# Patient Record
Sex: Male | Born: 1950
Health system: Southern US, Community
[De-identification: ages and names within clinical notes are randomized; demographics above are authoritative.]

## PROBLEM LIST (undated history)

## (undated) DIAGNOSIS — E785 Hyperlipidemia, unspecified: Secondary | ICD-10-CM

## (undated) DIAGNOSIS — M1990 Unspecified osteoarthritis, unspecified site: Secondary | ICD-10-CM

## (undated) DIAGNOSIS — F419 Anxiety disorder, unspecified: Secondary | ICD-10-CM

## (undated) DIAGNOSIS — F32A Depression, unspecified: Secondary | ICD-10-CM

## (undated) DIAGNOSIS — G709 Myoneural disorder, unspecified: Secondary | ICD-10-CM

## (undated) DIAGNOSIS — M109 Gout, unspecified: Secondary | ICD-10-CM

## (undated) DIAGNOSIS — I10 Essential (primary) hypertension: Secondary | ICD-10-CM

## (undated) HISTORY — DX: Depression, unspecified: F32.A

## (undated) HISTORY — DX: Hyperlipidemia, unspecified: E78.5

## (undated) HISTORY — DX: Essential (primary) hypertension: I10

## (undated) HISTORY — DX: Unspecified osteoarthritis, unspecified site: M19.90

## (undated) HISTORY — DX: Anxiety disorder, unspecified: F41.9

## (undated) HISTORY — DX: Gout, unspecified: M10.9

## (undated) HISTORY — DX: Myoneural disorder, unspecified: G70.9

---

## 2002-10-19 HISTORY — PX: CERVICAL DISCECTOMY: SHX98

## 2005-09-22 ENCOUNTER — Emergency Department (HOSPITAL_COMMUNITY): Admission: EM | Admit: 2005-09-22 | Discharge: 2005-09-22 | Payer: Self-pay | Admitting: Family Medicine

## 2008-05-17 ENCOUNTER — Ambulatory Visit: Payer: Self-pay | Admitting: *Deleted

## 2008-05-17 ENCOUNTER — Ambulatory Visit: Payer: Self-pay | Admitting: Internal Medicine

## 2008-05-17 LAB — CONVERTED CEMR LAB
ALT: 22 units/L (ref 0–53)
Albumin: 4.4 g/dL (ref 3.5–5.2)
CO2: 21 meq/L (ref 19–32)
Calcium: 9.1 mg/dL (ref 8.4–10.5)
Chloride: 106 meq/L (ref 96–112)
Cholesterol: 231 mg/dL — ABNORMAL HIGH (ref 0–200)
Glucose, Bld: 101 mg/dL — ABNORMAL HIGH (ref 70–99)
PSA: 0.34 ng/mL (ref 0.10–4.00)
Sodium: 139 meq/L (ref 135–145)
Total Bilirubin: 0.7 mg/dL (ref 0.3–1.2)
Total Protein: 7.3 g/dL (ref 6.0–8.3)
Triglycerides: 181 mg/dL — ABNORMAL HIGH (ref <150)
VLDL: 36 mg/dL (ref 0–40)

## 2008-05-31 ENCOUNTER — Ambulatory Visit: Payer: Self-pay | Admitting: Internal Medicine

## 2008-06-22 ENCOUNTER — Ambulatory Visit: Payer: Self-pay | Admitting: Internal Medicine

## 2008-07-19 ENCOUNTER — Ambulatory Visit: Payer: Self-pay | Admitting: Internal Medicine

## 2008-08-09 ENCOUNTER — Ambulatory Visit: Payer: Self-pay | Admitting: Internal Medicine

## 2008-10-02 ENCOUNTER — Ambulatory Visit: Payer: Self-pay | Admitting: Internal Medicine

## 2008-12-06 ENCOUNTER — Ambulatory Visit: Payer: Self-pay | Admitting: Internal Medicine

## 2008-12-06 LAB — CONVERTED CEMR LAB
ALT: 25 units/L (ref 0–53)
Albumin: 4.5 g/dL (ref 3.5–5.2)
CO2: 23 meq/L (ref 19–32)
Calcium: 10.1 mg/dL (ref 8.4–10.5)
Chloride: 106 meq/L (ref 96–112)
Cholesterol: 283 mg/dL — ABNORMAL HIGH (ref 0–200)
Glucose, Bld: 92 mg/dL (ref 70–99)
Potassium: 4.4 meq/L (ref 3.5–5.3)
Sodium: 141 meq/L (ref 135–145)
Testosterone: 583.3 ng/dL (ref 350–890)
Total Protein: 7.5 g/dL (ref 6.0–8.3)
Triglycerides: 222 mg/dL — ABNORMAL HIGH (ref <150)
VLDL: 44 mg/dL — ABNORMAL HIGH (ref 0–40)

## 2009-10-09 ENCOUNTER — Ambulatory Visit: Payer: Self-pay | Admitting: Internal Medicine

## 2009-10-09 LAB — CONVERTED CEMR LAB: PSA: 0.33 ng/mL (ref 0.10–4.00)

## 2015-12-30 ENCOUNTER — Emergency Department (HOSPITAL_COMMUNITY): Payer: Self-pay

## 2015-12-30 ENCOUNTER — Emergency Department (HOSPITAL_COMMUNITY)
Admission: EM | Admit: 2015-12-30 | Discharge: 2015-12-30 | Disposition: A | Discharge status: Home / Self Care | Payer: Self-pay | Attending: Emergency Medicine | Admitting: Emergency Medicine

## 2015-12-30 ENCOUNTER — Encounter (HOSPITAL_COMMUNITY): Payer: Self-pay | Admitting: Emergency Medicine

## 2015-12-30 DIAGNOSIS — J189 Pneumonia, unspecified organism: Secondary | ICD-10-CM

## 2015-12-30 DIAGNOSIS — I10 Essential (primary) hypertension: Secondary | ICD-10-CM | POA: Insufficient documentation

## 2015-12-30 DIAGNOSIS — J159 Unspecified bacterial pneumonia: Secondary | ICD-10-CM | POA: Insufficient documentation

## 2015-12-30 LAB — CBC
HEMATOCRIT: 37.2 % — AB (ref 39.0–52.0)
HEMOGLOBIN: 12.8 g/dL — AB (ref 13.0–17.0)
MCH: 30.6 pg (ref 26.0–34.0)
MCHC: 34.4 g/dL (ref 30.0–36.0)
MCV: 89 fL (ref 78.0–100.0)
Platelets: 282 10*3/uL (ref 150–400)
RBC: 4.18 MIL/uL — ABNORMAL LOW (ref 4.22–5.81)
RDW: 12.7 % (ref 11.5–15.5)
WBC: 7.6 10*3/uL (ref 4.0–10.5)

## 2015-12-30 LAB — COMPREHENSIVE METABOLIC PANEL
ALBUMIN: 3.3 g/dL — AB (ref 3.5–5.0)
ALT: 45 U/L (ref 17–63)
ANION GAP: 13 (ref 5–15)
AST: 33 U/L (ref 15–41)
Alkaline Phosphatase: 134 U/L — ABNORMAL HIGH (ref 38–126)
BILIRUBIN TOTAL: 0.8 mg/dL (ref 0.3–1.2)
BUN: 9 mg/dL (ref 6–20)
CHLORIDE: 102 mmol/L (ref 101–111)
CO2: 26 mmol/L (ref 22–32)
Calcium: 9.6 mg/dL (ref 8.9–10.3)
Creatinine, Ser: 1.24 mg/dL (ref 0.61–1.24)
GFR calc Af Amer: >60 mL/min (ref >60)
GFR, EST NON AFRICAN AMERICAN: 60 mL/min — AB (ref >60)
GLUCOSE: 126 mg/dL — AB (ref 65–99)
POTASSIUM: 3.4 mmol/L — AB (ref 3.5–5.1)
Sodium: 141 mmol/L (ref 135–145)
TOTAL PROTEIN: 7.6 g/dL (ref 6.5–8.1)

## 2015-12-30 LAB — LIPASE, BLOOD: LIPASE: 20 U/L (ref 11–51)

## 2015-12-30 MED ORDER — LISINOPRIL 20 MG PO TABS: 20.0000 mg | ORAL_TABLET | Freq: Every day | ORAL | Status: DC

## 2015-12-30 MED ORDER — LISINOPRIL 20 MG PO TABS
40.0000 mg | ORAL_TABLET | Freq: Once | ORAL | Status: AC
  Administered 2015-12-30: 40 mg via ORAL
  Filled 2015-12-30: qty 2

## 2015-12-30 MED ORDER — LEVOFLOXACIN 500 MG PO TABS: 500.0000 mg | ORAL_TABLET | Freq: Every day | ORAL | Status: DC

## 2015-12-30 MED ORDER — ALBUTEROL SULFATE (2.5 MG/3ML) 0.083% IN NEBU
5.0000 mg | INHALATION_SOLUTION | Freq: Once | RESPIRATORY_TRACT | Status: AC
  Administered 2015-12-30: 5 mg via RESPIRATORY_TRACT

## 2015-12-30 MED ORDER — POTASSIUM CHLORIDE CRYS ER 20 MEQ PO TBCR
40.0000 meq | EXTENDED_RELEASE_TABLET | Freq: Once | ORAL | Status: AC
  Administered 2015-12-30: 40 meq via ORAL
  Filled 2015-12-30: qty 2

## 2015-12-30 MED ORDER — ALBUTEROL SULFATE (2.5 MG/3ML) 0.083% IN NEBU
INHALATION_SOLUTION | RESPIRATORY_TRACT | Status: AC
  Filled 2015-12-30: qty 6

## 2015-12-30 MED ORDER — LEVOFLOXACIN 750 MG PO TABS
750.0000 mg | ORAL_TABLET | Freq: Once | ORAL | Status: AC
Start: 2015-12-30 — End: 2015-12-30
  Administered 2015-12-30: 750 mg via ORAL
  Filled 2015-12-30: qty 1

## 2015-12-30 MED ORDER — ACETAMINOPHEN 500 MG PO TABS
1000.0000 mg | ORAL_TABLET | Freq: Once | ORAL | Status: AC
  Administered 2015-12-30: 1000 mg via ORAL
  Filled 2015-12-30: qty 2

## 2015-12-30 NOTE — ED Notes (Signed)
Pt stable, ambulatory, states understanding of discharge instructions 

## 2015-12-30 NOTE — Discharge Instructions (Signed)
Tylenol as needed for fevers. Push fluids stay hydrated. Take Levaquin as prescribed until finished. Your blood pressure is significantly elevated. Her started on any medicine for that today. You'll need to be seen in follow-up by primary care physician within the next 1 month to recheck your pressure, kidney function, and refill your medications. Avoid salt. Walk or exercise 20 minutes per day. Control your weight     Hypertension Hypertension, commonly called high blood pressure, is when the force of blood pumping through your arteries is too strong. Your arteries are the blood vessels that carry blood from your heart throughout your body. A blood pressure reading consists of a higher number over a lower number, such as 110/72. The higher number (systolic) is the pressure inside your arteries when your heart pumps. The lower number (diastolic) is the pressure inside your arteries when your heart relaxes. Ideally you want your blood pressure below 120/80. Hypertension forces your heart to work harder to pump blood. Your arteries may become narrow or stiff. Having untreated or uncontrolled hypertension can cause heart attack, stroke, kidney disease, and other problems. RISK FACTORS Some risk factors for high blood pressure are controllable. Others are not.  Risk factors you cannot control include:   Race. You may be at higher risk if you are African American.  Age. Risk increases with age.  Gender. Men are at higher risk than women before age 33 years. After age 68, women are at higher risk than men. Risk factors you can control include:  Not getting enough exercise or physical activity.  Being overweight.  Getting too much fat, sugar, calories, or salt in your diet.  Drinking too much alcohol. SIGNS AND SYMPTOMS Hypertension does not usually cause signs or symptoms. Extremely high blood pressure (hypertensive crisis) may cause headache, anxiety, shortness of breath, and  nosebleed. DIAGNOSIS To check if you have hypertension, your health care provider will measure your blood pressure while you are seated, with your arm held at the level of your heart. It should be measured at least twice using the same arm. Certain conditions can cause a difference in blood pressure between your right and left arms. A blood pressure reading that is higher than normal on one occasion does not mean that you need treatment. If it is not clear whether you have high blood pressure, you may be asked to return on a different day to have your blood pressure checked again. Or, you may be asked to monitor your blood pressure at home for 1 or more weeks. TREATMENT Treating high blood pressure includes making lifestyle changes and possibly taking medicine. Living a healthy lifestyle can help lower high blood pressure. You may need to change some of your habits. Lifestyle changes may include:  Following the DASH diet. This diet is high in fruits, vegetables, and whole grains. It is low in salt, red meat, and added sugars.  Keep your sodium intake below 2,300 mg per day.  Getting at least 30-45 minutes of aerobic exercise at least 4 times per week.  Losing weight if necessary.  Not smoking.  Limiting alcoholic beverages.  Learning ways to reduce stress. Your health care provider may prescribe medicine if lifestyle changes are not enough to get your blood pressure under control, and if one of the following is true:  You are 19-57 years of age and your systolic blood pressure is above 140.  You are 57 years of age or older, and your systolic blood pressure is above 150.  Your diastolic blood pressure is above 90.  You have diabetes, and your systolic blood pressure is over [id number] or your diastolic blood pressure is over 90.  You have kidney disease and your blood pressure is above 140/90.  You have heart disease and your blood pressure is above 140/90. Your personal target blood  pressure may vary depending on your medical conditions, your age, and other factors. HOME CARE INSTRUCTIONS  Have your blood pressure rechecked as directed by your health care provider.   Take medicines only as directed by your health care provider. Follow the directions carefully. Blood pressure medicines must be taken as prescribed. The medicine does not work as well when you skip doses. Skipping doses also puts you at risk for problems.  Do not smoke.   Monitor your blood pressure at home as directed by your health care provider. SEEK MEDICAL CARE IF:   You think you are having a reaction to medicines taken.  You have recurrent headaches or feel dizzy.  You have swelling in your ankles.  You have trouble with your vision. SEEK IMMEDIATE MEDICAL CARE IF:  You develop a severe headache or confusion.  You have unusual weakness, numbness, or feel faint.  You have severe chest or abdominal pain.  You vomit repeatedly.  You have trouble breathing. MAKE SURE YOU:   Understand these instructions.  Will watch your condition.  Will get help right away if you are not doing well or get worse.   This information is not intended to replace advice given to you by your health care provider. Make sure you discuss any questions you have with your health care provider.   Document Released: 10/05/2005 Document Revised: 02/19/2015 Document Reviewed: 07/28/2013 Elsevier Interactive Patient Education [employer].

## 2015-12-30 NOTE — ED Provider Notes (Signed)
CSN: [id number]     Arrival date & time 12/30/15  [id number] History   First MD Initiated Contact with Patient 12/30/15 1152     Chief Complaint  Patient presents with  . Cough  . Fatigue      HPI  Residual evaluation of a cough is been productive for the last 3-4 days. Fevers and chills for 2 days until today. He has not felt feverish today. He continues with a cough productive of some yellow sputum. No nausea vomiting. No swelling x-rays. No pain in his chest.  Doesn't have a primary care physician. States is been told he has hypertension and has never been medicated. Is not a smoker. No history of lung disease with COPD or asthma.  Has not been given Pneumovax/Prevnar, or flu vaccination a shared  History reviewed. No pertinent past medical history. History reviewed. No pertinent past surgical history. No family history on file. Social History  Substance Use Topics  . Smoking status: Never Smoker   . Smokeless tobacco: None  . Alcohol Use: Yes    Review of Systems  Constitutional: Positive for chills and fatigue. Negative for fever, diaphoresis and appetite change.  HENT: Negative for mouth sores, sore throat and trouble swallowing.   Eyes: Negative for visual disturbance.  Respiratory: Positive for cough. Negative for chest tightness, shortness of breath and wheezing.   Cardiovascular: Negative for chest pain.  Gastrointestinal: Negative for nausea, vomiting, abdominal pain, diarrhea and abdominal distention.  Endocrine: Negative for polydipsia, polyphagia and polyuria.  Genitourinary: Negative for dysuria, frequency and hematuria.  Musculoskeletal: Negative for gait problem.  Skin: Negative for color change, pallor and rash.  Neurological: Negative for dizziness, syncope, light-headedness and headaches.  Hematological: Does not bruise/bleed easily.  Psychiatric/Behavioral: Negative for behavioral problems and confusion.      Allergies  Review of patient's allergies  indicates no known allergies.  Home Medications   Prior to Admission medications   Medication Sig Start Date End Date Taking? Authorizing Provider  levofloxacin (LEVAQUIN) 500 MG tablet Take 1 tablet (500 mg total) by mouth daily. 12/30/15   Tanna Furry, MD  lisinopril (PRINIVIL,ZESTRIL) 20 MG tablet Take 1 tablet (20 mg total) by mouth daily. 12/30/15   Tanna Furry, MD   BP 206/108 mmHg  Pulse 95  Temp(Src) 99.9 F (37.7 C) (Oral)  Resp 21  SpO2 91% Physical Exam  Constitutional: He is oriented to person, place, and time. He appears well-developed and well-nourished. No distress.  HENT:  Head: Normocephalic.  Eyes: Conjunctivae are normal. Pupils are equal, round, and reactive to light. No scleral icterus.  Neck: Normal range of motion. Neck supple. No thyromegaly present.  Cardiovascular: Normal rate and regular rhythm.  Exam reveals no gallop and no friction rub.   No murmur heard. Pulmonary/Chest: Effort normal and breath sounds normal. No respiratory distress. He has no wheezes. He has no rales.  Bibasilar crackles. No wheezing or prolongation.  Abdominal: Soft. Bowel sounds are normal. He exhibits no distension. There is no tenderness. There is no rebound.  Musculoskeletal: Normal range of motion.  Neurological: He is alert and oriented to person, place, and time.  Skin: Skin is warm and dry. No rash noted.  Psychiatric: He has a normal mood and affect. His behavior is normal.    ED Course  Procedures (including critical care time) Labs Review Labs Reviewed  COMPREHENSIVE METABOLIC PANEL - Abnormal; Notable for the following:    Potassium 3.4 (*)    Glucose, Bld 126 (*)  Albumin 3.3 (*)    Alkaline Phosphatase 134 (*)    GFR calc non Af Amer 60 (*)    All other components within normal limits  CBC - Abnormal; Notable for the following:    RBC 4.18 (*)    Hemoglobin 12.8 (*)    HCT 37.2 (*)    All other components within normal limits  LIPASE, BLOOD    Imaging  Review Dg Chest 2 View  12/30/2015  CLINICAL DATA:  Cough for 4 days. EXAM: CHEST  2 VIEW COMPARISON:  None. FINDINGS: Lower cervical spine fixation. Midline trachea. Normal heart size and mediastinal contours. No pleural effusion or pneumothorax. Mild bibasilar volume loss and airspace disease. IMPRESSION: Mild bibasilar volume loss with patchy airspace disease, favoring atelectasis. If patient's symptoms persist or worsen, follow-up radiographs should be considered in 3-5 days to exclude less likely early pneumonia. Electronically Signed   By: Abigail Miyamoto M.D.   On: 12/30/2015 11:23   I have personally reviewed and evaluated these images and lab results as part of my medical decision-making.   EKG Interpretation None      MDM   Final diagnoses:  CAP (community acquired pneumonia)  Essential hypertension    Patient's pressure is significant elevated. Recheck is 200/103. Medicated with by mouth lisinopril. Also given Levaquin. His exam suggest a pneumonia and crackles at the right base. Atelectasis versus pneumonia on chest x-ray. Normal renal function. He has no chest pain. No stroke symptoms or cardiac risk factors and preserve renal function. Plan will be Zestril 30 day supply. Encouraged to control his weight, limit his salt intake, 20 minutes of exercise per day, and other less modifications for his hypertension. This can be seen in follow-up within the next several weeks to renew his medications, follows pressures, and refill medications.  He is not hypoxemic. On my exam he is moving air without difficulty or increased worker breathing or saturations 94% on room air.    Tanna Furry, MD 12/30/15 [phone]

## 2015-12-30 NOTE — ED Notes (Signed)
Pt c/o productive cough x 4 days. Pt reports decrease energy. Denies N/V. Pt has tried over the counter medication without relief.

## 2016-02-10 ENCOUNTER — Ambulatory Visit: Payer: Self-pay | Admitting: Family Medicine

## 2016-02-17 ENCOUNTER — Encounter: Payer: Self-pay | Admitting: Family Medicine

## 2016-02-17 ENCOUNTER — Ambulatory Visit (INDEPENDENT_AMBULATORY_CARE_PROVIDER_SITE_OTHER): Payer: Self-pay | Admitting: Family Medicine

## 2016-02-17 VITALS — BP 204/100 | HR 73 | Temp 98.5°F | Ht 64.5 in | Wt 177.0 lb

## 2016-02-17 DIAGNOSIS — Z7189 Other specified counseling: Secondary | ICD-10-CM

## 2016-02-17 DIAGNOSIS — I10 Essential (primary) hypertension: Secondary | ICD-10-CM | POA: Diagnosis not present

## 2016-02-17 DIAGNOSIS — M109 Gout, unspecified: Secondary | ICD-10-CM

## 2016-02-17 DIAGNOSIS — R350 Frequency of micturition: Secondary | ICD-10-CM

## 2016-02-17 DIAGNOSIS — M10079 Idiopathic gout, unspecified ankle and foot: Secondary | ICD-10-CM

## 2016-02-17 DIAGNOSIS — Z7689 Persons encountering health services in other specified circumstances: Secondary | ICD-10-CM

## 2016-02-17 LAB — COMPLETE METABOLIC PANEL WITH GFR
ALT: 22 U/L (ref 9–46)
AST: 20 U/L (ref 10–35)
Albumin: 4.4 g/dL (ref 3.6–5.1)
Alkaline Phosphatase: 65 U/L (ref 40–115)
BILIRUBIN TOTAL: 0.8 mg/dL (ref 0.2–1.2)
BUN: 16 mg/dL (ref 7–25)
CALCIUM: 9.2 mg/dL (ref 8.6–10.3)
CHLORIDE: 104 mmol/L (ref 98–110)
CO2: 25 mmol/L (ref 20–31)
CREATININE: 1.23 mg/dL (ref 0.70–1.25)
GFR, Est African American: 71 mL/min (ref >=60)
GFR, Est Non African American: 62 mL/min (ref >=60)
Glucose, Bld: 80 mg/dL (ref 65–99)
Potassium: 4 mmol/L (ref 3.5–5.3)
Sodium: 140 mmol/L (ref 135–146)
TOTAL PROTEIN: 7.4 g/dL (ref 6.1–8.1)

## 2016-02-17 MED ORDER — CLONIDINE HCL 0.1 MG PO TABS
0.1000 mg | ORAL_TABLET | Freq: Once | ORAL | Status: DC
  Administered 2016-02-17: 0.1 mg via ORAL

## 2016-02-17 MED ORDER — CLONIDINE HCL 0.1 MG PO TABS
0.1000 mg | ORAL_TABLET | Freq: Once | ORAL | Status: AC
  Administered 2016-02-17: 0.1 mg via ORAL

## 2016-02-17 MED ORDER — LISINOPRIL 20 MG PO TABS: 20.0000 mg | ORAL_TABLET | Freq: Every day | ORAL | Status: DC

## 2016-02-17 MED ORDER — AMLODIPINE BESYLATE 5 MG PO TABS: 5.0000 mg | ORAL_TABLET | Freq: Every day | ORAL | Status: DC

## 2016-02-17 MED FILL — ?LISINOPRIL 20 MG TABLET: 20 | 30 days supply | Qty: 30 | Fill #0

## 2016-02-17 MED FILL — ?AMLODIPINE BESYLATE 5 MG T: 5 | 30 days supply | Qty: 30 | Fill #0

## 2016-02-17 NOTE — Patient Instructions (Signed)
Pick up your medications at [facility], [place] [address]  They should provide your first prescription free.  Be very careful of salt in your diet.  We will address any other issues when you get your medicare. Come back when that happens Come back for a nurse visit for a BP check in 2 weeks.

## 2016-02-17 NOTE — Progress Notes (Signed)
Patient ID: Daniel Haynes, male   DOB: May 29, 1951, 65 y.o.   MRN: [id number]   Daniel Haynes, is a 65 y.o. male  [id number]  [id number]  DOB - May 12, 1951  CC:  Chief Complaint  Patient presents with  . new patient/ get established    still has some residual cough from pnuemonia, has not had blood pressure meds for 2 weeks, ran out of rx       HPI: Daniel Haynes is a 65 y.o. male here to establish care. He has not had routine health care in 10-12 years. We was seen in mid March in ED for pneumonia and was found to have hypertension. His BP was 206/108. He was started on lisinopril 20 and tol to follow-up here. He has been out of lisinopril for about 2 weeks.  His only other chronic complaint if of urinary frequency.  He is in process of starting Medicare and would like to delay assessment and treatment of this until that is in place. He also has a history of an occasional gout flare. He has completed his round of Levaquin for pneumonia and reports resolution of symptoms. His K+ was 3.4 in ED, Glucose 126 and Alk Phos. 134. Will repeat Cmet.  Health Maintenance:  He will follow-up when Medicare is in place to discuss health maintenance. He is in need for immunizations and colon cancer screening.  No Known Allergies History reviewed. No pertinent past medical history. Current Outpatient Prescriptions on File Prior to Visit  Medication Sig Dispense Refill  . levofloxacin (LEVAQUIN) 500 MG tablet Take 1 tablet (500 mg total) by mouth daily. (Patient not taking: Reported on 02/17/2016) 10 tablet 0   No current facility-administered medications on file prior to visit.   Family History  Problem Relation Age of Onset  . Cancer Mother   . Cancer Father    Social History   Social History  . Marital Status: Single    Spouse Name: N/A  . Number of Children: N/A  . Years of Education: N/A   Occupational History  . Not on file.   Social History Main Topics  . Smoking status: Never Smoker    . Smokeless tobacco: Not on file  . Alcohol Use: Yes  . Drug Use: No  . Sexual Activity: Not on file   Other Topics Concern  . Not on file   Social History Narrative    Review of Systems: Constitutional: Negative for fever, chills, appetite change, weight loss,  Fatigue. Skin: Negative for rashes or lesions of concern. HENT: Negative for ear pain, ear discharge.nose bleeds Eyes: Negative for pain, discharge, redness, itching and visual disturbance.Uses reading glasses Neck: Negative for pain, stiffness Respiratory: Negative for cough, shortness of breath,   Cardiovascular: Negative for chest pain, palpitations and leg swelling. Gastrointestinal: Negative for abdominal pain, nausea, vomiting, diarrhea, constipations Genitourinary: Negative for dysuria, frequency, hematuria. Positive for urinary frequency Musculoskeletal: Negative for back pain, joint pain, joint  swelling, and gait problem.Negative for weakness. Neurological: Negative for dizziness, tremors, seizures, syncope,   light-headedness, numbness and headaches.  Hematological: Negative for easy bruising or bleeding Psychiatric/Behavioral: Negative for depression, anxiety, decreased concentration, confusion   Objective:   Filed Vitals:   02/17/16 1421  BP: 227/116  Pulse: 73  Temp: 98.5 F (36.9 C)    Physical Exam: Constitutional: Patient appears well-developed and well-nourished. No distress. HENT: Normocephalic, atraumatic, External right and left ear normal. Oropharynx is clear and moist. Teeth in poor repair Eyes: Conjunctivae and EOM are  normal. PERRLA, no scleral icterus. Neck: Normal ROM. Neck supple. No lymphadenopathy, No thyromegaly. CVS: RRR, S1/S2 +, no murmurs, no gallops, no rubs Pulmonary: Effort and breath sounds normal, no stridor, rhonchi, wheezes, rales.  Abdominal: Soft. Normoactive BS,, no distension, tenderness, rebound or guarding.  Musculoskeletal: Normal range of motion. No edema and no  tenderness.  Neuro: Alert.Normal muscle tone coordination. Non-focal Skin: Skin is warm and dry. No rash noted. Not diaphoretic. No erythema. No pallor. Psychiatric: Normal mood and affect. Behavior, judgment, thought content normal.  Lab Results  Component Value Date   WBC 7.6 12/30/2015   HGB 12.8* 12/30/2015   HCT 37.2* 12/30/2015   MCV 89.0 12/30/2015   PLT 282 12/30/2015   Lab Results  Component Value Date   CREATININE 1.24 12/30/2015   BUN 9 12/30/2015   NA 141 12/30/2015   K 3.4* 12/30/2015   CL 102 12/30/2015   CO2 26 12/30/2015    No results found for: HGBA1C Lipid Panel     Component Value Date/Time   CHOL 283* 12/06/2008 2058   TRIG 222* 12/06/2008 2058   HDL 41 12/06/2008 2058   CHOLHDL 6.9 Ratio 12/06/2008 2058   VLDL 44* 12/06/2008 2058   LDLCALC 198* 12/06/2008 2058       Assessment and plan:   1. Accelerated hypertension  - COMPLETE METABOLIC PANEL WITH GFR - amLODipine (NORVASC) 5 MG tablet; Take 1 tablet (5 mg total) by mouth daily.  Dispense: 90 tablet; Refill: 3 - lisinopril (PRINIVIL,ZESTRIL) 20 MG tablet; Take 1 tablet (20 mg total) by mouth daily.  Dispense: 90 tablet; Refill: 3 - cloNIDine (CATAPRES) tablet 0.1 mg; Take 1 tablet (0.1 mg total) by mouth once. -Nurse visit in 2 weeks for BP check.   2. Encounter to establish care -I have reviewed information presented by the patient and pertinent information from his chart. No interpreter was needed.     The patient was given clear instructions to go to ER or return to medical center if symptoms don't improve, worsen or new problems develop. The patient verbalized understanding.    Micheline Chapman FNP  02/17/2016, 2:56 PM

## 2016-03-02 ENCOUNTER — Ambulatory Visit: Payer: Self-pay

## 2016-03-02 VITALS — BP 176/98

## 2016-03-02 DIAGNOSIS — I10 Essential (primary) hypertension: Secondary | ICD-10-CM

## 2016-03-05 ENCOUNTER — Other Ambulatory Visit: Payer: Self-pay

## 2016-03-05 DIAGNOSIS — I10 Essential (primary) hypertension: Secondary | ICD-10-CM

## 2016-03-05 MED ORDER — AMLODIPINE BESYLATE 5 MG PO TABS: 5.0000 mg | ORAL_TABLET | Freq: Every day | ORAL | Status: DC

## 2016-03-05 MED ORDER — LISINOPRIL 20 MG PO TABS: 20.0000 mg | ORAL_TABLET | Freq: Every day | ORAL | Status: DC

## 2016-03-05 NOTE — Telephone Encounter (Signed)
Refills sent into pharmacy. Thanks!  

## 2016-03-20 ENCOUNTER — Other Ambulatory Visit: Payer: Self-pay | Admitting: Family Medicine

## 2016-03-20 ENCOUNTER — Ambulatory Visit (INDEPENDENT_AMBULATORY_CARE_PROVIDER_SITE_OTHER): Payer: Medicare Other | Admitting: *Deleted

## 2016-03-20 VITALS — BP 160/90 | HR 67

## 2016-03-20 DIAGNOSIS — I10 Essential (primary) hypertension: Secondary | ICD-10-CM

## 2016-03-20 MED ORDER — COLCHICINE 0.6 MG PO TABS: ORAL_TABLET | ORAL | Status: DC

## 2016-03-20 MED FILL — COLCHICINE 0.6 MG TABLET: 0.6 | 1 days supply | Qty: 3 | Fill #0

## 2016-03-20 NOTE — Progress Notes (Signed)
Patient is here for BP recheck  Patient complains of gout pain which the PCP ordered indocin to help treat during today's visit.  Patient has taken medication today.

## 2016-03-20 NOTE — Patient Instructions (Signed)
Thank you for coming in today for your BP recheck Please increase your Amlodipine intake to 1 tablet twice a day and return in a month for a recheck. Pickup the cholchicine from [facility] pharmacy.

## 2016-03-30 MED FILL — ?LISINOPRIL 20 MG TABLET: 20 | 30 days supply | Qty: 30 | Fill #1

## 2016-03-30 MED FILL — ?AMLODIPINE BESYLATE 5 MG T: 5 | 30 days supply | Qty: 30 | Fill #1

## 2016-04-24 ENCOUNTER — Other Ambulatory Visit: Payer: Self-pay | Admitting: Family Medicine

## 2016-04-24 ENCOUNTER — Ambulatory Visit: Payer: Self-pay

## 2016-04-24 MED ORDER — AMLODIPINE BESYLATE 10 MG PO TABS: 10.0000 mg | ORAL_TABLET | Freq: Every day | ORAL | Status: DC

## 2016-04-24 MED FILL — ?AMLODIPINE BESYLATE 10 MG: 10 | 30 days supply | Qty: 30 | Fill #0

## 2016-04-30 MED FILL — ?LISINOPRIL 20 MG TABLET: 20 | 30 days supply | Qty: 30 | Fill #2

## 2016-05-26 ENCOUNTER — Ambulatory Visit: Payer: Self-pay

## 2016-05-29 ENCOUNTER — Other Ambulatory Visit (HOSPITAL_COMMUNITY): Payer: Self-pay | Admitting: Family Medicine

## 2016-05-29 ENCOUNTER — Ambulatory Visit: Payer: Self-pay

## 2016-05-29 VITALS — BP 172/95

## 2016-05-29 DIAGNOSIS — I10 Essential (primary) hypertension: Secondary | ICD-10-CM

## 2016-05-29 MED ORDER — HYDROCHLOROTHIAZIDE 25 MG PO TABS: 25.0000 mg | ORAL_TABLET | Freq: Every day | ORAL | 0 refills | Status: DC

## 2016-05-29 MED FILL — HYDROCHLOROTHIAZIDE 25 MG T: 25 | 30 days supply | Qty: 30 | Fill #0

## 2016-06-01 MED FILL — AMLODIPINE BESYLATE 10 MG T: 10 | 30 days supply | Qty: 30 | Fill #1

## 2016-06-01 MED FILL — ?LISINOPRIL 20 MG TABLET: 20 | 30 days supply | Qty: 30 | Fill #3

## 2016-06-26 ENCOUNTER — Ambulatory Visit (INDEPENDENT_AMBULATORY_CARE_PROVIDER_SITE_OTHER): Payer: Medicare Other | Admitting: Family Medicine

## 2016-06-26 ENCOUNTER — Encounter: Payer: Self-pay | Admitting: Gastroenterology

## 2016-06-26 VITALS — BP 147/81 | HR 62 | Temp 98.1°F | Resp 16 | Ht 65.0 in | Wt 182.0 lb

## 2016-06-26 DIAGNOSIS — I10 Essential (primary) hypertension: Secondary | ICD-10-CM

## 2016-06-26 DIAGNOSIS — Z131 Encounter for screening for diabetes mellitus: Secondary | ICD-10-CM

## 2016-06-26 LAB — CBC WITH DIFFERENTIAL/PLATELET
BASOS PCT: 0 %
Basophils Absolute: 0 cells/uL (ref 0–200)
EOS PCT: 6 %
Eosinophils Absolute: 288 cells/uL (ref 15–500)
HEMATOCRIT: 39.6 % (ref 38.5–50.0)
HEMOGLOBIN: 13.9 g/dL (ref 13.2–17.1)
LYMPHS ABS: 2112 {cells}/uL (ref 850–3900)
LYMPHS PCT: 44 %
MCH: 30.3 pg (ref 27.0–33.0)
MCHC: 35.1 g/dL (ref 32.0–36.0)
MCV: 86.3 fL (ref 80.0–100.0)
MONO ABS: 480 {cells}/uL (ref 200–950)
MPV: 9.1 fL (ref 7.5–12.5)
Monocytes Relative: 10 %
NEUTROS PCT: 40 %
Neutro Abs: 1920 cells/uL (ref 1500–7800)
Platelets: 240 10*3/uL (ref 140–400)
RBC: 4.59 MIL/uL (ref 4.20–5.80)
RDW: 13.5 % (ref 11.0–15.0)
WBC: 4.8 10*3/uL (ref 3.8–10.8)

## 2016-06-26 LAB — LIPID PANEL
CHOLESTEROL: 316 mg/dL — AB (ref 125–200)
HDL: 47 mg/dL (ref >=40)
LDL CALC: 216 mg/dL — AB (ref <130)
TRIGLYCERIDES: 267 mg/dL — AB (ref <150)
Total CHOL/HDL Ratio: 6.7 Ratio — ABNORMAL HIGH (ref <=5.0)
VLDL: 53 mg/dL — ABNORMAL HIGH (ref <30)

## 2016-06-27 LAB — HEPATITIS C ANTIBODY: HCV Ab: NEGATIVE

## 2016-07-01 NOTE — Progress Notes (Signed)
Daniel Haynes, is a 65 y.o. male  [id number]  [id number]  DOB - [birth year]  CC:  Chief Complaint  Patient presents with  . Hypertension       HPI: Daniel Haynes is a 65 y.o. male here for follow-up of hypertension.  He is on amlodipine, hctz, lisiniopril. His bp today is 147/86. His ROS below is negative for any symptoms that might be related to hypertension.  Health maintenance: is in need of colonoscopy. He declines immunizations today.   No Known Allergies No past medical history on file. Current Outpatient Prescriptions on File Prior to Visit  Medication Sig Dispense Refill  . amLODipine (NORVASC) 10 MG tablet Take 1 tablet (10 mg total) by mouth daily. 90 tablet 3  . hydrochlorothiazide (HYDRODIURIL) 25 MG tablet Take 1 tablet (25 mg total) by mouth daily. 30 tablet 0  . lisinopril (PRINIVIL,ZESTRIL) 20 MG tablet Take 1 tablet (20 mg total) by mouth daily. 90 tablet 3  . colchicine 0.6 MG tablet Take 2 tablets once, then 0ne tablet one hour later. (Patient not taking: Reported on 06/26/2016) 3 tablet 0  . levofloxacin (LEVAQUIN) 500 MG tablet Take 1 tablet (500 mg total) by mouth daily. (Patient not taking: Reported on 02/17/2016) 10 tablet 0   No current facility-administered medications on file prior to visit.    Family History  Problem Relation Age of Onset  . Cancer Mother   . Cancer Father    Social History   Social History  . Marital status: Single    Spouse name: N/A  . Number of children: N/A  . Years of education: N/A   Occupational History  . Not on file.   Social History Main Topics  . Smoking status: Never Smoker  . Smokeless tobacco: Not on file  . Alcohol use Yes  . Drug use: No  . Sexual activity: Not on file   Other Topics Concern  . Not on file   Social History Narrative  . No narrative on file    Review of Systems: Constitutional: Negative Skin: Negative HENT: Negative  Eyes: Negative  Neck: Negative Respiratory:  Negative Cardiovascular: Negative Gastrointestinal: Negative Genitourinary: Negative  Musculoskeletal: Negative   Neurological: Negative for Hematological: Negative  Psychiatric/Behavioral: Negative    Objective:   Vitals:   06/26/16 0828 06/26/16 0848  BP: (!) 160/82 (!) 147/81  Pulse: 62   Resp: 16   Temp: 98.1 F (36.7 C)     Physical Exam: Constitutional: Patient appears well-developed and well-nourished. No distress. HENT: Normocephalic, atraumatic, External right and left ear normal. Oropharynx is clear and moist.  Eyes: Conjunctivae and EOM are normal. PERRLA, no scleral icterus. Neck: Normal ROM. Neck supple. No lymphadenopathy, No thyromegaly. CVS: RRR, S1/S2 +, no murmurs, no gallops, no rubs Pulmonary: Effort and breath sounds normal, no stridor, rhonchi, wheezes, rales.  Abdominal: Soft. Normoactive BS,, no distension, tenderness, rebound or guarding.  Musculoskeletal: Normal range of motion. No edema and no tenderness.  Neuro: Alert.Normal muscle tone coordination. Non-focal Skin: Skin is warm and dry. No rash noted. Not diaphoretic. No erythema. No pallor. Psychiatric: Normal mood and affect. Behavior, judgment, thought content normal.  Lab Results  Component Value Date   WBC 4.8 06/26/2016   HGB 13.9 06/26/2016   HCT 39.6 06/26/2016   MCV 86.3 06/26/2016   PLT 240 06/26/2016   Lab Results  Component Value Date   CREATININE 1.23 02/17/2016   BUN 16 02/17/2016   NA 140 02/17/2016   K 4.0  02/17/2016   CL 104 02/17/2016   CO2 25 02/17/2016    No results found for: HGBA1C Lipid Panel     Component Value Date/Time   CHOL 316 (H) 06/26/2016 0849   TRIG 267 (H) 06/26/2016 0849   HDL 47 06/26/2016 0849   CHOLHDL 6.7 (H) 06/26/2016 0849   VLDL 53 (H) 06/26/2016 0849   LDLCALC 216 (H) 06/26/2016 0849       Assessment and plan:   1. Essential hypertension  - CBC with Differential - Lipid panel - Hepatitis C Antibody - Ambulatory referral to  Gastroenterology      No Follow-up on file.  The patient was given clear instructions to go to ER or return to medical center if symptoms don't improve, worsen or new problems develop. The patient verbalized understanding.    Micheline Chapman FNP  07/01/2016, 2:29 PM

## 2016-07-02 ENCOUNTER — Other Ambulatory Visit: Payer: Self-pay | Admitting: Family Medicine

## 2016-07-02 MED ORDER — PRAVASTATIN SODIUM 40 MG PO TABS: 40.0000 mg | ORAL_TABLET | Freq: Every day | ORAL | 1 refills | Status: DC

## 2016-07-02 MED FILL — AMLODIPINE BESYLATE 10 MG T: 10 | 30 days supply | Qty: 30 | Fill #2

## 2016-07-02 MED FILL — ?PRAVASTATIN NA 40 MG TAB: 40 MG | 30 days supply | Qty: 30 | Fill #0

## 2016-07-02 MED FILL — ?LISINOPRIL 20 MG TABLET: 20 | 30 days supply | Qty: 30 | Fill #4

## 2016-08-12 MED FILL — ?LISINOPRIL 20 MG TABLET: 20 | 30 days supply | Qty: 30 | Fill #5

## 2016-08-12 MED FILL — ?AMLODIPINE BESYLATE 10 MG: 10 | 30 days supply | Qty: 30 | Fill #3

## 2016-08-12 MED FILL — ?PRAVASTATIN NA 40 MG TAB: 40 MG | 30 days supply | Qty: 30 | Fill #1

## 2016-08-14 ENCOUNTER — Ambulatory Visit (AMBULATORY_SURGERY_CENTER): Payer: Self-pay | Admitting: *Deleted

## 2016-08-14 VITALS — Ht 65.0 in | Wt 182.0 lb

## 2016-08-14 DIAGNOSIS — Z1211 Encounter for screening for malignant neoplasm of colon: Secondary | ICD-10-CM

## 2016-08-14 MED ORDER — NA SULFATE-K SULFATE-MG SULF 17.5-3.13-1.6 GM/177ML PO SOLN: 1.0000 | Freq: Once | ORAL | 0 refills | Status: AC

## 2016-08-14 MED FILL — SUPREP BOWEL PREP KIT: 17.5-3.13-1 | 1 days supply | Qty: 354 | Fill #0

## 2016-08-14 NOTE — Progress Notes (Signed)
No egg or soy allergy known to patient  No issues with past sedation with any surgeries  or procedures, no intubation problems  No diet pills per patient No home 02 use per patient  No blood thinners per patient  Pt denies issues with constipation  No A fib or A flutter   emmi declined'   

## 2016-08-21 ENCOUNTER — Ambulatory Visit (AMBULATORY_SURGERY_CENTER): Payer: Medicare Other | Admitting: Gastroenterology

## 2016-08-21 ENCOUNTER — Encounter: Payer: Self-pay | Admitting: Gastroenterology

## 2016-08-21 VITALS — BP 108/72 | HR 74 | Temp 97.8°F | Resp 20 | Ht 65.0 in | Wt 182.0 lb

## 2016-08-21 DIAGNOSIS — Z1211 Encounter for screening for malignant neoplasm of colon: Secondary | ICD-10-CM

## 2016-08-21 DIAGNOSIS — D124 Benign neoplasm of descending colon: Secondary | ICD-10-CM | POA: Diagnosis not present

## 2016-08-21 DIAGNOSIS — E669 Obesity, unspecified: Secondary | ICD-10-CM | POA: Diagnosis not present

## 2016-08-21 DIAGNOSIS — I10 Essential (primary) hypertension: Secondary | ICD-10-CM | POA: Diagnosis not present

## 2016-08-21 DIAGNOSIS — D120 Benign neoplasm of cecum: Secondary | ICD-10-CM | POA: Diagnosis not present

## 2016-08-21 DIAGNOSIS — Z1212 Encounter for screening for malignant neoplasm of rectum: Secondary | ICD-10-CM | POA: Diagnosis not present

## 2016-08-21 DIAGNOSIS — D122 Benign neoplasm of ascending colon: Secondary | ICD-10-CM | POA: Diagnosis not present

## 2016-08-21 MED ORDER — SODIUM CHLORIDE 0.9 % IV SOLN: 500.0000 mL | INTRAVENOUS | Status: DC

## 2016-08-21 NOTE — Progress Notes (Signed)
After interviewing pt. He changed statement that he had something to drink @10 :00 and amount,made CRNA aware and he is going to talk to pt. After completing case he is doing,made pt. Aware that CRNA will come to talk to him about intake of fluids.

## 2016-08-21 NOTE — Op Note (Signed)
[place] Patient Name: Daniel Haynes Procedure Date: 08/21/2016 10:57 AM MRN: [id number] Endoscopist: Milus Banister , MD Age: 65 Referring MD:  Date of Birth: [birth year] Gender: Male Account #: [id number] Procedure:                Colonoscopy Indications:              Screening for colorectal malignant neoplasm Medicines:                Monitored Anesthesia Care Procedure:                Pre-Anesthesia Assessment:                           - Prior to the procedure, a History and Physical                            was performed, and patient medications and                            allergies were reviewed. The patient's tolerance of                            previous anesthesia was also reviewed. The risks                            and benefits of the procedure and the sedation                            options and risks were discussed with the patient.                            All questions were answered, and informed consent                            was obtained. Prior Anticoagulants: The patient has                            taken no previous anticoagulant or antiplatelet                            agents. ASA Grade Assessment: II - A patient with                            mild systemic disease. After reviewing the risks                            and benefits, the patient was deemed in                            satisfactory condition to undergo the procedure.                           After obtaining informed consent, the colonoscope  was passed under direct vision. Throughout the                            procedure, the patient's blood pressure, pulse, and                            oxygen saturations were monitored continuously. The                            Model CF-HQ190L [id number]) scope was introduced                            through the anus and advanced to the the cecum,                            identified by  appendiceal orifice and ileocecal                            valve. The colonoscopy was performed without                            difficulty. The patient tolerated the procedure                            well. The quality of the bowel preparation was                            excellent. The ileocecal valve, appendiceal                            orifice, and rectum were photographed. Scope In: 11:01:26 AM Scope Out: 11:13:36 AM Scope Withdrawal Time: 0 hours 9 minutes 34 seconds  Total Procedure Duration: 0 hours 12 minutes 10 seconds  Findings:                 Four sessile polyps were found in the descending                            colon, ascending colon and cecum. The polyps were 2                            to 4 mm in size. These polyps were removed with a                            cold snare. Resection and retrieval were complete.                           Medium sized internal hemorrhoids.                           The exam was otherwise without abnormality on                            direct and retroflexion views. Complications:  No immediate complications. Estimated Blood Loss:     Estimated blood loss: none. Impression:               - Four 2 to 4 mm polyps in the descending colon, in                            the ascending colon and in the cecum, removed with                            a cold snare. Resected and retrieved.                           - Medium sized internal hemorrhoids.                           - The examination was otherwise normal on direct                            and retroflexion views. Recommendation:           - Patient has a contact number available for                            emergencies. The signs and symptoms of potential                            delayed complications were discussed with the                            patient. Return to normal activities tomorrow.                            Written discharge instructions  were provided to the                            patient.                           - Resume previous diet.                           - Continue present medications.                           You will receive a letter within 2-3 weeks with the                            pathology results and my final recommendations.                           If the polyp(s) is proven to be 'pre-cancerous' on                            pathology, you will need repeat colonoscopy in 3-5  years. If the polyp(s) is NOT 'precancerous' on                            pathology then you should repeat colon cancer                            screening in 10 years with colonoscopy without need                            for colon cancer screening by any method prior to                            then (including stool testing). Milus Banister, MD 08/21/2016 11:16:11 AM This report has been signed electronically.

## 2016-08-21 NOTE — Progress Notes (Signed)
Pt. Questioned regarding npo status. Walked through timeline of pts morning. After reviewing with the patient he is confident he has had no liquid after 0830. States he was confuse with the initial question.

## 2016-08-21 NOTE — Progress Notes (Signed)
Called to room to assist during endoscopic procedure.  Patient ID and intended procedure confirmed with present staff. Received instructions for my participation in the procedure from the performing physician.  

## 2016-08-21 NOTE — Progress Notes (Signed)
To recovery, report to Sechler, RN, VSS 

## 2016-08-21 NOTE — Patient Instructions (Addendum)
Impression/Recommendations:  Polyp handout given to patient. Hemorrhoid handout given to patient.  Repeat colonoscopy in 3-5 years, or 10 years based on pathology report.  YOU HAD AN ENDOSCOPIC PROCEDURE TODAY AT [facility]:   Refer to the procedure report that was given to you for any specific questions about what was found during the examination.  If the procedure report does not answer your questions, please call your gastroenterologist to clarify.  If you requested that your care partner not be given the details of your procedure findings, then the procedure report has been included in a sealed envelope for you to review at your convenience later.  YOU SHOULD EXPECT: Some feelings of bloating in the abdomen. Passage of more gas than usual.  Walking can help get rid of the air that was put into your GI tract during the procedure and reduce the bloating. If you had a lower endoscopy (such as a colonoscopy or flexible sigmoidoscopy) you may notice spotting of blood in your stool or on the toilet paper. If you underwent a bowel prep for your procedure, you may not have a normal bowel movement for a few days.  Please Note:  You might notice some irritation and congestion in your nose or some drainage.  This is from the oxygen used during your procedure.  There is no need for concern and it should clear up in a day or so.  SYMPTOMS TO REPORT IMMEDIATELY:   Following lower endoscopy (colonoscopy or flexible sigmoidoscopy):  Excessive amounts of blood in the stool  Significant tenderness or worsening of abdominal pains  Swelling of the abdomen that is new, acute  Fever of 100F or higher  For urgent or emergent issues, a gastroenterologist can be reached at any hour by calling [phone].   DIET:  We do recommend a small meal at first, but then you may proceed to your regular diet.  Drink plenty of fluids but you should avoid alcoholic beverages for 24 hours.  ACTIVITY:   You should plan to take it easy for the rest of today and you should NOT DRIVE or use heavy machinery until tomorrow (because of the sedation medicines used during the test).    FOLLOW UP: Our staff will call the number listed on your records the next business day following your procedure to check on you and address any questions or concerns that you may have regarding the information given to you following your procedure. If we do not reach you, we will leave a message.  However, if you are feeling well and you are not experiencing any problems, there is no need to return our call.  We will assume that you have returned to your regular daily activities without incident.  If any biopsies were taken you will be contacted by phone or by letter within the next 1-3 weeks.  Please call us at [phone] if you have not heard about the biopsies in 3 weeks.    SIGNATURES/CONFIDENTIALITY: You and/or your care partner have signed paperwork which will be entered into your electronic medical record.  These signatures attest to the fact that that the information above on your After Visit Summary has been reviewed and is understood.  Full responsibility of the confidentiality of this discharge information lies with you and/or your care-partner.

## 2016-08-24 ENCOUNTER — Telehealth: Payer: Self-pay

## 2016-08-24 NOTE — Telephone Encounter (Signed)
  Follow up Call-  Call back number 08/21/2016  Post procedure Call Back phone  # [phone]  Permission to leave phone message Yes  Some recent data might be hidden    Patient was called for follow up after his procedure on 08/21/2016. No answer at the number given for follow up phone call. An answering machine did not come on so I was not able to leave a message.

## 2016-08-24 NOTE — Telephone Encounter (Signed)
  Follow up Call-  Call back number 08/21/2016  Post procedure Call Back phone  # [phone]  Permission to leave phone message Yes  Some recent data might be hidden     Patient questions:  Do you have a fever, pain , or abdominal swelling? No. Pain Score  0 *  Have you tolerated food without any problems? Yes.    Have you been able to return to your normal activities? Yes.    Do you have any questions about your discharge instructions: Diet   No. Medications  No. Follow up visit  No.  Do you have questions or concerns about your Care? No.  Actions: * If pain score is 4 or above: No action needed, pain <4.

## 2016-09-01 ENCOUNTER — Encounter: Payer: Self-pay | Admitting: Gastroenterology

## 2016-09-21 MED FILL — AMLODIPINE BESYLATE 10 MG T: 10 | 30 days supply | Qty: 30 | Fill #4

## 2016-09-21 MED FILL — ?PRAVASTATIN NA 40 MG TAB: 40 MG | 30 days supply | Qty: 30 | Fill #2

## 2016-09-21 MED FILL — LISINOPRIL 20 MG TABLET: 20 | 30 days supply | Qty: 30 | Fill #6

## 2016-10-18 DIAGNOSIS — Z23 Encounter for immunization: Secondary | ICD-10-CM | POA: Diagnosis not present

## 2016-10-27 MED FILL — AMLODIPINE BESYLATE 10 MG T: 10 | 30 days supply | Qty: 30 | Fill #5

## 2016-10-27 MED FILL — ?LISINOPRIL 20 MG TABLET: 20 | 30 days supply | Qty: 30 | Fill #7

## 2016-12-07 MED FILL — ?LISINOPRIL 20 MG TABLET: 20 | 30 days supply | Qty: 30 | Fill #8

## 2016-12-07 MED FILL — AMLODIPINE BESYLATE 10 MG T: 10 | 30 days supply | Qty: 30 | Fill #6

## 2016-12-28 ENCOUNTER — Encounter: Payer: Self-pay | Admitting: Family Medicine

## 2016-12-28 ENCOUNTER — Ambulatory Visit (INDEPENDENT_AMBULATORY_CARE_PROVIDER_SITE_OTHER): Payer: Medicare Other | Admitting: Family Medicine

## 2016-12-28 VITALS — BP 151/90 | HR 79 | Temp 97.9°F | Resp 12 | Ht 65.0 in | Wt 180.0 lb

## 2016-12-28 DIAGNOSIS — I10 Essential (primary) hypertension: Secondary | ICD-10-CM | POA: Diagnosis not present

## 2016-12-28 DIAGNOSIS — Z23 Encounter for immunization: Secondary | ICD-10-CM | POA: Diagnosis not present

## 2016-12-28 DIAGNOSIS — R350 Frequency of micturition: Secondary | ICD-10-CM | POA: Diagnosis not present

## 2016-12-28 LAB — COMPLETE METABOLIC PANEL WITH GFR
ALT: 18 U/L (ref 9–46)
AST: 22 U/L (ref 10–35)
Albumin: 4.2 g/dL (ref 3.6–5.1)
Alkaline Phosphatase: 81 U/L (ref 40–115)
BUN: 17 mg/dL (ref 7–25)
CALCIUM: 9 mg/dL (ref 8.6–10.3)
CHLORIDE: 107 mmol/L (ref 98–110)
CO2: 24 mmol/L (ref 20–31)
Creat: 1.24 mg/dL (ref 0.70–1.25)
GFR, EST AFRICAN AMERICAN: 70 mL/min (ref >=60)
GFR, EST NON AFRICAN AMERICAN: 61 mL/min (ref >=60)
Glucose, Bld: 100 mg/dL — ABNORMAL HIGH (ref 65–99)
POTASSIUM: 4.1 mmol/L (ref 3.5–5.3)
Sodium: 142 mmol/L (ref 135–146)
Total Bilirubin: 0.5 mg/dL (ref 0.2–1.2)
Total Protein: 6.9 g/dL (ref 6.1–8.1)

## 2016-12-28 LAB — CBC WITH DIFFERENTIAL/PLATELET
BASOS ABS: 0 {cells}/uL (ref 0–200)
Basophils Relative: 0 %
EOS ABS: 213 {cells}/uL (ref 15–500)
Eosinophils Relative: 3 %
HCT: 41.4 % (ref 38.5–50.0)
Hemoglobin: 14.4 g/dL (ref 13.2–17.1)
LYMPHS PCT: 29 %
Lymphs Abs: 2059 cells/uL (ref 850–3900)
MCH: 31 pg (ref 27.0–33.0)
MCHC: 34.8 g/dL (ref 32.0–36.0)
MCV: 89.2 fL (ref 80.0–100.0)
MONOS PCT: 9 %
MPV: 8.9 fL (ref 7.5–12.5)
Monocytes Absolute: 639 cells/uL (ref 200–950)
NEUTROS PCT: 59 %
Neutro Abs: 4189 cells/uL (ref 1500–7800)
PLATELETS: 276 10*3/uL (ref 140–400)
RBC: 4.64 MIL/uL (ref 4.20–5.80)
RDW: 14.7 % (ref 11.0–15.0)
WBC: 7.1 10*3/uL (ref 3.8–10.8)

## 2016-12-28 LAB — POCT URINALYSIS DIP (DEVICE)
BILIRUBIN URINE: NEGATIVE
Glucose, UA: NEGATIVE mg/dL
HGB URINE DIPSTICK: NEGATIVE
Ketones, ur: NEGATIVE mg/dL
NITRITE: NEGATIVE
Protein, ur: NEGATIVE mg/dL
Specific Gravity, Urine: 1.015 (ref 1.005–1.030)
Urobilinogen, UA: 0.2 mg/dL (ref 0.0–1.0)
pH: 6.5 (ref 5.0–8.0)

## 2016-12-28 LAB — LIPID PANEL
CHOL/HDL RATIO: 5.1 ratio — AB (ref <5.0)
Cholesterol: 250 mg/dL — ABNORMAL HIGH (ref <200)
HDL: 49 mg/dL (ref >40)
LDL Cholesterol: 152 mg/dL — ABNORMAL HIGH (ref <100)
Triglycerides: 246 mg/dL — ABNORMAL HIGH (ref <150)
VLDL: 49 mg/dL — ABNORMAL HIGH (ref <30)

## 2016-12-28 MED ORDER — LISINOPRIL 20 MG PO TABS: 20.0000 mg | ORAL_TABLET | Freq: Every day | ORAL | 3 refills | Status: DC

## 2016-12-28 MED ORDER — PNEUMOCOCCAL 13-VAL CONJ VACC IM SUSP
0.5000 mL | INTRAMUSCULAR | Status: AC | PRN
  Administered 2016-12-28: 0.5 mL via INTRAMUSCULAR

## 2016-12-28 MED ORDER — PNEUMOCOCCAL 13-VAL CONJ VACC IM SUSP: 0.5000 mL | INTRAMUSCULAR | Status: DC

## 2016-12-28 MED ORDER — AMLODIPINE BESYLATE 10 MG PO TABS: 10.0000 mg | ORAL_TABLET | Freq: Every day | ORAL | 3 refills | Status: DC

## 2016-12-28 MED ORDER — PRAVASTATIN SODIUM 40 MG PO TABS: 40.0000 mg | ORAL_TABLET | Freq: Every day | ORAL | 1 refills | Status: DC

## 2016-12-28 MED ORDER — HYDROCHLOROTHIAZIDE 25 MG PO TABS: 25.0000 mg | ORAL_TABLET | Freq: Every day | ORAL | 0 refills | Status: DC

## 2016-12-28 MED FILL — PRAVASTATIN NA 40 MG TAB: 40 | 30 days supply | Qty: 30 | Fill #0

## 2016-12-28 MED FILL — ?LISINOPRIL 20 MG TABLET: 20 | 30 days supply | Qty: 30 | Fill #0

## 2016-12-28 MED FILL — ?AMLODIPINE BESYLATE 10 MG: 10 | 30 days supply | Qty: 30 | Fill #0

## 2016-12-28 MED FILL — HYDROCHLOROTHIAZIDE 25 MG T: 25 | 30 days supply | Qty: 30 | Fill #0

## 2016-12-28 NOTE — Progress Notes (Signed)
Daniel Haynes, is a 66 y.o. male  FXT:[id number]  GDJ:[id number]  DOB - 07-18-1951  CC:  Chief Complaint  Patient presents with  . Follow-up    is not using HCTZ or Pravastatin, was unsure of why he needed those  . urinary urgency    urinates 4 times an hour up all night, denies any burning or discoloration of urine       HPI: Daniel Haynes is a 66 y.o. male here for follow-up hypertension. His only complaint is of urinary frequency and urgency. This has been going on for several months. He reports urinating up to 4 times an hour. He has not been taking his HCTZ.  Health Maintenance: needs Tdap and pneumonia. Reports having flu elsewhere.   No Known Allergies Past Medical History:  Diagnosis Date  . Arthritis    hands fingers wrists   . Gout   . Hyperlipidemia   . Hypertension    Current Outpatient Prescriptions on File Prior to Visit  Medication Sig Dispense Refill  . colchicine 0.6 MG tablet Take 2 tablets once, then 0ne tablet one hour later. (Patient not taking: Reported on 08/21/2016) 3 tablet 0   Current Facility-Administered Medications on File Prior to Visit  Medication Dose Route Frequency Provider Last Rate Last Dose  . 0.9 %  sodium chloride infusion  500 mL Intravenous Continuous Milus Banister, MD       Family History  Problem Relation Age of Onset  . Cancer Mother   . Cancer Father   . Colon cancer Neg Hx   . Colon polyps Neg Hx   . Esophageal cancer Neg Hx   . Rectal cancer Neg Hx   . Stomach cancer Neg Hx    Social History   Social History  . Marital status: Single    Spouse name: N/A  . Number of children: N/A  . Years of education: N/A   Occupational History  . Not on file.   Social History Main Topics  . Smoking status: Former Research scientist (life sciences)  . Smokeless tobacco: Never Used  . Alcohol use 12.6 oz/week    21 Shots of liquor per week     Comment: 3 a day   . Drug use: Yes    Types: Marijuana     Comment: occ use   . Sexual activity: Not on  file   Other Topics Concern  . Not on file   Social History Narrative  . No narrative on file    Review of Systems: Constitutional: Negative Skin: Negative HENT: Negative  Eyes: Negative  Neck: Negative Respiratory: Negative Cardiovascular: Negative Gastrointestinal: Negative Genitourinary: + for frequency and urgency Musculoskeletal: Negative   Neurological: Negative for Hematological: Negative  Psychiatric/Behavioral: Negative    Objective:   Vitals:   12/28/16 0831  BP: (!) 151/90  Pulse: 79  Resp: 12  Temp: 97.9 F (36.6 C)    Physical Exam: Constitutional: Patient appears well-developed and well-nourished. No distress. HENT: Normocephalic, atraumatic, External right and left ear normal. Oropharynx is clear and moist.  Eyes: Conjunctivae and EOM are normal. PERRLA, no scleral icterus. Neck: Normal ROM. Neck supple. No lymphadenopathy, No thyromegaly. CVS: RRR, S1/S2 +, no murmurs, no gallops, no rubs Pulmonary: Effort and breath sounds normal, no stridor, rhonchi, wheezes, rales.  Abdominal: Soft. Normoactive BS,, no distension, tenderness, rebound or guarding.  Musculoskeletal: Normal range of motion. No edema and no tenderness.  Neuro: Alert.Normal muscle tone coordination. Non-focal Skin: Skin is warm and dry. No rash  noted. Not diaphoretic. No erythema. No pallor. Psychiatric: Normal mood and affect. Behavior, judgment, thought content normal.  Lab Results  Component Value Date   WBC 4.8 06/26/2016   HGB 13.9 06/26/2016   HCT 39.6 06/26/2016   MCV 86.3 06/26/2016   PLT 240 06/26/2016   Lab Results  Component Value Date   CREATININE 1.23 02/17/2016   BUN 16 02/17/2016   NA 140 02/17/2016   K 4.0 02/17/2016   CL 104 02/17/2016   CO2 25 02/17/2016    No results found for: HGBA1C Lipid Panel     Component Value Date/Time   CHOL 316 (H) 06/26/2016 0849   TRIG 267 (H) 06/26/2016 0849   HDL 47 06/26/2016 0849   CHOLHDL 6.7 (H) 06/26/2016 0849    VLDL 53 (H) 06/26/2016 0849   LDLCALC 216 (H) 06/26/2016 0849        Assessment and plan:   1. Hypertension, unspecified type  - COMPLETE METABOLIC PANEL WITH GFR - CBC with Differential - Lipid panel - Ambulatory referral to Urology - amLODipine (NORVASC) 10 MG tablet; Take 1 tablet (10 mg total) by mouth daily.  Dispense: 90 tablet; Refill: 3 - restart  hydrochlorothiazide (HYDRODIURIL) 25 MG tablet; Take 1 tablet (25 mg total) by mouth daily.  Dispense: 30 tablet; Refill: 0 - lisinopril (PRINIVIL,ZESTRIL) 20 MG tablet; Take 1 tablet (20 mg total) by mouth daily.  Dispense: 90 tablet; Refill: 3 - pravastatin (PRAVACHOL) 40 MG tablet; Take 1 tablet (40 mg total) by mouth daily.  Dispense: 90 tablet; Refill: 1  2. Urinary frequency and urgency -referral to urologist.    Return in about 6 months (around 06/30/2017).  The patient was given clear instructions to go to ER or return to medical center if symptoms don't improve, worsen or new problems develop. The patient verbalized understanding.    Micheline Chapman FNP  12/28/2016, 9:02 AM

## 2017-03-03 MED FILL — ?LISINOPRIL 20 MG TABLET: 20 | 30 days supply | Qty: 30 | Fill #1

## 2017-03-03 MED FILL — PRAVASTATIN NA 40 MG TAB: 40 | 30 days supply | Qty: 30 | Fill #1

## 2017-03-03 MED FILL — ?AMLODIPINE BESYLATE 10 MG: 10 | 30 days supply | Qty: 30 | Fill #1

## 2017-04-19 MED FILL — ?AMLODIPINE BESYLATE 10 MG: 10 | 30 days supply | Qty: 30 | Fill #2

## 2017-04-19 MED FILL — PRAVASTATIN NA 40 MG TAB: 40 | 30 days supply | Qty: 30 | Fill #2

## 2017-04-19 MED FILL — ?LISINOPRIL 20 MG TABLET: 20 | 30 days supply | Qty: 30 | Fill #2

## 2017-05-28 MED FILL — LISINOPRIL 20 MG TAB: 20 | 30 days supply | Qty: 30 | Fill #3

## 2017-05-28 MED FILL — AMLODIPINE BESYLATE 10 MG T: 10 | 30 days supply | Qty: 30 | Fill #3

## 2017-05-28 MED FILL — PRAVASTATIN NA 40 MG TAB: 40 | 30 days supply | Qty: 30 | Fill #3

## 2017-06-29 ENCOUNTER — Encounter: Payer: Self-pay | Admitting: Family Medicine

## 2017-06-29 ENCOUNTER — Ambulatory Visit (INDEPENDENT_AMBULATORY_CARE_PROVIDER_SITE_OTHER): Payer: Medicare Other | Admitting: Family Medicine

## 2017-06-29 VITALS — BP 142/88 | HR 65 | Temp 98.6°F | Resp 14 | Ht 65.0 in | Wt 178.0 lb

## 2017-06-29 DIAGNOSIS — R7309 Other abnormal glucose: Secondary | ICD-10-CM

## 2017-06-29 DIAGNOSIS — N393 Stress incontinence (female) (male): Secondary | ICD-10-CM | POA: Diagnosis not present

## 2017-06-29 DIAGNOSIS — R350 Frequency of micturition: Secondary | ICD-10-CM

## 2017-06-29 DIAGNOSIS — I10 Essential (primary) hypertension: Secondary | ICD-10-CM

## 2017-06-29 DIAGNOSIS — Z23 Encounter for immunization: Secondary | ICD-10-CM

## 2017-06-29 LAB — COMPLETE METABOLIC PANEL WITH GFR
AG RATIO: 1.5 (calc) (ref 1.0–2.5)
ALT: 17 U/L (ref 9–46)
AST: 19 U/L (ref 10–35)
Albumin: 4.3 g/dL (ref 3.6–5.1)
Alkaline phosphatase (APISO): 77 U/L (ref 40–115)
BUN / CREAT RATIO: 11 (calc) (ref 6–22)
BUN: 17 mg/dL (ref 7–25)
CALCIUM: 9.2 mg/dL (ref 8.6–10.3)
CO2: 23 mmol/L (ref 20–32)
CREATININE: 1.49 mg/dL — AB (ref 0.70–1.25)
Chloride: 104 mmol/L (ref 98–110)
GFR, EST AFRICAN AMERICAN: 56 mL/min/{1.73_m2} — AB (ref >=60)
GFR, EST NON AFRICAN AMERICAN: 48 mL/min/{1.73_m2} — AB (ref >=60)
GLOBULIN: 2.8 g/dL (ref 1.9–3.7)
Glucose, Bld: 94 mg/dL (ref 65–99)
Potassium: 4.2 mmol/L (ref 3.5–5.3)
SODIUM: 139 mmol/L (ref 135–146)
TOTAL PROTEIN: 7.1 g/dL (ref 6.1–8.1)
Total Bilirubin: 0.6 mg/dL (ref 0.2–1.2)

## 2017-06-29 LAB — POCT URINALYSIS DIP (DEVICE)
Bilirubin Urine: NEGATIVE
GLUCOSE, UA: NEGATIVE mg/dL
HGB URINE DIPSTICK: NEGATIVE
KETONES UR: NEGATIVE mg/dL
Leukocytes, UA: NEGATIVE
Nitrite: NEGATIVE
PROTEIN: NEGATIVE mg/dL
SPECIFIC GRAVITY, URINE: 1.02 (ref 1.005–1.030)
Urobilinogen, UA: 0.2 mg/dL (ref 0.0–1.0)
pH: 6 (ref 5.0–8.0)

## 2017-06-29 LAB — CBC WITH DIFFERENTIAL/PLATELET
BASOS PCT: 0.6 %
Basophils Absolute: 31 cells/uL (ref 0–200)
EOS ABS: 239 {cells}/uL (ref 15–500)
Eosinophils Relative: 4.6 %
HCT: 40.3 % (ref 38.5–50.0)
HEMOGLOBIN: 13.7 g/dL (ref 13.2–17.1)
LYMPHS ABS: 1810 {cells}/uL (ref 850–3900)
MCH: 29.7 pg (ref 27.0–33.0)
MCHC: 34 g/dL (ref 32.0–36.0)
MCV: 87.4 fL (ref 80.0–100.0)
MPV: 9.3 fL (ref 7.5–12.5)
Monocytes Relative: 11 %
NEUTROS ABS: 2548 {cells}/uL (ref 1500–7800)
Neutrophils Relative %: 49 %
Platelets: 284 10*3/uL (ref 140–400)
RBC: 4.61 10*6/uL (ref 4.20–5.80)
RDW: 12.9 % (ref 11.0–15.0)
Total Lymphocyte: 34.8 %
WBC: 5.2 10*3/uL (ref 3.8–10.8)
WBCMIX: 572 {cells}/uL (ref 200–950)

## 2017-06-29 LAB — PSA: PSA: 0.3 ng/mL (ref <=4.0)

## 2017-06-29 LAB — POCT GLYCOSYLATED HEMOGLOBIN (HGB A1C): Hemoglobin A1C: 5.3

## 2017-06-29 NOTE — Progress Notes (Signed)
Patient ID: [name], male    DOB: [birth year], 66 y.o.   MRN: [id number]  PCP: Scot Jun, FNP  Chief Complaint  Patient presents with  . Follow-up    6 month  . Urinary Frequency    Subjective:  HPI Daniel Haynes is a 66 y.o. male, with a history of hypertension and dyslipidemia presents today for a 6 month follow-up.   Urinary Frequency He complains today of chronic urinary frequency. This is not a new problem, although he reports this has worsened over the last 3 months. No history of prostate enlargement or diabetes. 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 He has experienced urinary incontinence and has to wear adult briefs occasionally. Denies strong urine odor or flank pain.  Hypertension and Dyslipidemia  Daniel Haynes reports no home monitoring of blood pressure although he is compliant with medication. He adheres to low sodium diet. No routine physical activity. He drives approximately 8-10 hours daily and therefore very sedentary. He is a nonsmoker. Denies any episodes of dizziness,headaches, shortness of breath, or chest pain. Social History   Social History  . Marital status: Single    Spouse name: N/A  . Number of children: N/A  . Years of education: N/A   Occupational History  . Not on file.   Social History Main Topics  . Smoking status: Former Research scientist (life sciences)  . Smokeless tobacco: Never Used  . Alcohol use 12.6 oz/week    21 Shots of liquor per week     Comment: 3 a day   . Drug use: Yes    Types: Marijuana     Comment: occ use   . Sexual activity: Not on file   Other Topics Concern  . Not on file   Social History Narrative  . No narrative on file    Family History  Problem Relation Age of Onset  . Cancer Mother   . Cancer Father   . Colon cancer Neg Hx   . Colon polyps Neg Hx   . Esophageal cancer Neg Hx   . Rectal cancer Neg Hx   . Stomach cancer Neg Hx    Review of Systems See HPI  Patient Active Problem List    Diagnosis Date Noted  . Accelerated hypertension 02/17/2016  . Frequency of urination 02/17/2016  . Gout 02/17/2016    No Known Allergies  Prior to Admission medications   Medication Sig Start Date End Date Taking? Authorizing Provider  amLODipine (NORVASC) 10 MG tablet Take 1 tablet (10 mg total) by mouth daily. Patient not taking: Reported on 06/29/2017 12/28/16   Micheline Chapman, NP  colchicine 0.6 MG tablet Take 2 tablets once, then 0ne tablet one hour later. Patient not taking: Reported on 08/21/2016 03/20/16   Micheline Chapman, NP  hydrochlorothiazide (HYDRODIURIL) 25 MG tablet Take 1 tablet (25 mg total) by mouth daily. Patient not taking: Reported on 06/29/2017 12/28/16   Micheline Chapman, NP  lisinopril (PRINIVIL,ZESTRIL) 20 MG tablet Take 1 tablet (20 mg total) by mouth daily. Patient not taking: Reported on 06/29/2017 12/28/16   Micheline Chapman, NP  pravastatin (PRAVACHOL) 40 MG tablet Take 1 tablet (40 mg total) by mouth daily. Patient not taking: Reported on 06/29/2017 12/28/16   Micheline Chapman, NP   Past Medical, Surgical Family and Social History reviewed and updated.   Objective:   Today's Vitals   06/29/17 0838  BP: (!) 142/88  Pulse: 65  Resp: 14  Temp: 98.6 F (37 C)  TempSrc: Oral  SpO2: 98%  Weight: 178 lb (80.7 kg)  Height: 5\' 5"  (1.651 m)   Wt Readings from Last 3 Encounters:  06/29/17 178 lb (80.7 kg)  12/28/16 180 lb (81.6 kg)  08/21/16 182 lb (82.6 kg)   Physical Exam  Constitutional: He is oriented to person, place, and time. He appears well-developed and well-nourished.  HENT:  Head: Normocephalic and atraumatic.  Eyes: Pupils are equal, round, and reactive to light. Conjunctivae and EOM are normal.  Neck: Normal range of motion. Neck supple.  Cardiovascular: Normal rate, regular rhythm, normal heart sounds and intact distal pulses.   Pulmonary/Chest: Effort normal and breath sounds normal.  Abdominal: Soft. Bowel sounds are normal.   Musculoskeletal: Normal range of motion.  Neurological: He is alert and oriented to person, place, and time. Coordination normal.  Skin: Skin is warm and dry.  Psychiatric: He has a normal mood and affect. His behavior is normal. Judgment and thought content normal.   Assessment & Plan:  1. Urinary frequency,chronic problem for more than 1 year. Worsened over the last 3 months and has now evolved to episodic stress incontinence. Will check a PSA. He will return in two weeks for prostate exam. Given this problem have been occurring for an extended period of time, will go ahead and refer to urology for further evaluation.  2. Essential hypertension, uncontrolled -Increased lisinopril. Return in two weeks for blood pressure recheck.  3. Elevated glucose -Hemoglobin A1C 5.3, nondiabetic   4. Need for influenza vaccination  5. Stress incontinence  -Referral to urology   RTC: 2 weeks for blood pressure check and prostate exam   Carroll Sage. Kenton Kingfisher, MSN, FNP-C [facility]  [address] [place], [place] [address] [phone]

## 2017-06-29 NOTE — Patient Instructions (Addendum)
Increasing Lisinopril 40 daily (take 2, 20 mg tablets daily).

## 2017-06-30 ENCOUNTER — Ambulatory Visit: Payer: Medicare Other | Admitting: Family Medicine

## 2017-07-01 ENCOUNTER — Other Ambulatory Visit: Payer: Self-pay | Admitting: Internal Medicine

## 2017-07-01 ENCOUNTER — Encounter: Payer: Self-pay | Admitting: Family Medicine

## 2017-07-01 DIAGNOSIS — I10 Essential (primary) hypertension: Secondary | ICD-10-CM

## 2017-07-01 MED FILL — AMLODIPINE BESYLATE 10 MG T: 10 | 30 days supply | Qty: 30 | Fill #4

## 2017-07-01 MED FILL — ?PRAVASTATIN SODIUM 40MG TA: 40 | 30 days supply | Qty: 30 | Fill #4

## 2017-07-01 MED FILL — LISINOPRIL 20 MG TAB: 20 | 30 days supply | Qty: 30 | Fill #4

## 2017-07-01 NOTE — Progress Notes (Signed)
Please mail.

## 2017-07-02 ENCOUNTER — Other Ambulatory Visit: Payer: Self-pay | Admitting: Internal Medicine

## 2017-07-02 DIAGNOSIS — I10 Essential (primary) hypertension: Secondary | ICD-10-CM

## 2017-07-02 MED FILL — HYDROCHLOROTHIAZIDE 25 MG T: 25 | 30 days supply | Qty: 30 | Fill #0

## 2017-07-14 ENCOUNTER — Ambulatory Visit: Payer: Medicare Other | Admitting: Family Medicine

## 2017-07-27 ENCOUNTER — Encounter: Payer: Self-pay | Admitting: Family Medicine

## 2017-07-27 ENCOUNTER — Ambulatory Visit (INDEPENDENT_AMBULATORY_CARE_PROVIDER_SITE_OTHER): Payer: Medicare Other | Admitting: Family Medicine

## 2017-07-27 ENCOUNTER — Telehealth: Payer: Self-pay | Admitting: Family Medicine

## 2017-07-27 VITALS — BP 142/80 | HR 70 | Temp 97.8°F | Resp 14 | Ht 65.0 in | Wt 176.0 lb

## 2017-07-27 DIAGNOSIS — Z008 Encounter for other general examination: Secondary | ICD-10-CM

## 2017-07-27 DIAGNOSIS — R350 Frequency of micturition: Secondary | ICD-10-CM | POA: Diagnosis not present

## 2017-07-27 NOTE — Telephone Encounter (Signed)
[facility] contact [facility] to inquire about patient being schedule for an appointment. Refer to my note from 06/29/2017 for reason for referral.

## 2017-07-27 NOTE — Telephone Encounter (Signed)
Left a vm for patient that he has been scheduled for 11/13 at 10:15am at [facility].

## 2017-07-27 NOTE — Progress Notes (Signed)
Patient ID: Daniel Haynes, male    DOB: [birth year], 66 y.o.   MRN: [id number]  PCP: Scot Jun, FNP  Chief Complaint  Patient presents with  . Follow-up    2 Weeks    Subjective:  HPI Daniel Haynes is a 66 y.o. male presents for evaluation of rectal exam to evaluate prostate. Kaelon continues to complain of urinary frequency and incontinence. During his last office visit, PSA level was checked and was within normal range. He complains urinary incontinence is worst with laughter or coughing. He has been referred to urology and has not schedule his evaluation appointment as of yet. He presents today for rectal exam. Social History   Social History  . Marital status: Single    Spouse name: N/A  . Number of children: N/A  . Years of education: N/A   Occupational History  . Not on file.   Social History Main Topics  . Smoking status: Former Research scientist (life sciences)  . Smokeless tobacco: Never Used  . Alcohol use 12.6 oz/week    21 Shots of liquor per week     Comment: 3 a day   . Drug use: Yes    Types: Marijuana     Comment: occ use   . Sexual activity: Not on file   Other Topics Concern  . Not on file   Social History Narrative  . No narrative on file    Family History  Problem Relation Age of Onset  . Cancer Mother   . Cancer Father   . Colon cancer Neg Hx   . Colon polyps Neg Hx   . Esophageal cancer Neg Hx   . Rectal cancer Neg Hx   . Stomach cancer Neg Hx    Review of Systems  See HPI   Patient Active Problem List   Diagnosis Date Noted  . Accelerated hypertension 02/17/2016  . Frequency of urination 02/17/2016  . Gout 02/17/2016    No Known Allergies  Prior to Admission medications   Medication Sig Start Date End Date Taking? Authorizing Provider  amLODipine (NORVASC) 10 MG tablet Take 1 tablet (10 mg total) by mouth daily. 12/28/16  Yes Micheline Chapman, NP  colchicine 0.6 MG tablet Take 2 tablets once, then 0ne tablet one hour later. 03/20/16  Yes Micheline Chapman, NP  hydrochlorothiazide (HYDRODIURIL) 25 MG tablet TAKE 1 TABLET (25 MG TOTAL) BY MOUTH DAILY. 07/01/17  Yes Scot Jun, FNP  lisinopril (PRINIVIL,ZESTRIL) 20 MG tablet Take 1 tablet (20 mg total) by mouth daily. 12/28/16  Yes Micheline Chapman, NP  pravastatin (PRAVACHOL) 40 MG tablet Take 1 tablet (40 mg total) by mouth daily. 12/28/16  Yes Micheline Chapman, NP   Past Medical, Surgical Family and Social History reviewed and updated.   Objective:   Today's Vitals   07/27/17 0801  BP: (!) 142/80  Pulse: 70  Resp: 14  Temp: 97.8 F (36.6 C)  TempSrc: Oral  SpO2: 99%  Weight: 176 lb (79.8 kg)  Height: 5\' 5"  (1.651 m)    Wt Readings from Last 3 Encounters:  07/27/17 176 lb (79.8 kg)  06/29/17 178 lb (80.7 kg)  12/28/16 180 lb (81.6 kg)   Physical Exam  Constitutional: He appears well-developed and well-nourished.  HENT:  Head: Normocephalic and atraumatic.  Eyes: Pupils are equal, round, and reactive to light. Conjunctivae and EOM are normal.  Neck: Normal range of motion. Neck supple.  Cardiovascular: Normal rate.   Pulmonary/Chest: Effort normal.  Genitourinary:  Rectum normal and prostate normal. Rectal exam shows guaiac negative stool.  Musculoskeletal: Normal range of motion.  Skin: Skin is warm and dry.  Psychiatric: He has a normal mood and affect. His behavior is normal. Judgment and thought content normal.   Assessment & Plan:  1. Encounter for rectal exam IFOBT POCT, negative  2. Urinary frequency, reschedule follow-up with [facility] for evaluation of incontinence and urinary frequency.  RTC: 6 months wellness visit.    Carroll Sage. Kenton Kingfisher, MSN, FNP-C [facility]  [address] [place], [place] [address] [phone]

## 2017-07-27 NOTE — Patient Instructions (Signed)
Please contact me here at the office if you do not hear from [facility].       Urinary Incontinence Urinary incontinence is the involuntary loss of urine from your bladder. What are the causes? There are many causes of urinary incontinence. They include:  Medicines.  Infections.  Prostatic enlargement, leading to overflow of urine from your bladder.  Surgery.  Neurological diseases.  Emotional factors.  What are the signs or symptoms? Urinary Incontinence can be divided into four types: 1. Urge incontinence. Urge incontinence is the involuntary loss of urine before you have the opportunity to go to the bathroom. There is a sudden urge to void but not enough time to reach a bathroom. 2. Stress incontinence. Stress incontinence is the sudden loss of urine with any activity that forces urine to pass. It is commonly caused by anatomical changes to the pelvis and sphincter areas of your body. 3. Overflow incontinence. Overflow incontinence is the loss of urine from an obstructed opening to your bladder. This results in a backup of urine and a resultant buildup of pressure within the bladder. When the pressure within the bladder exceeds the closing pressure of the sphincter, the urine overflows, which causes incontinence, similar to water overflowing a dam. 4. Total incontinence. Total incontinence is the loss of urine as a result of the inability to store urine within your bladder.  How is this diagnosed? Evaluating the cause of incontinence may require:  A thorough and complete medical and obstetric history.  A complete physical exam.  Laboratory tests such as a urine culture and sensitivities.  When additional tests are indicated, they can include:  An ultrasound exam.  Kidney and bladder X-rays.  Cystoscopy. This is an exam of the bladder using a narrow scope.  Urodynamic testing to test the nerve function to the bladder and sphincter areas.  How is this  treated? Treatment for urinary incontinence depends on the cause:  For urge incontinence caused by a bacterial infection, antibiotics will be prescribed. If the urge incontinence is related to medicines you take, your health care provider may have you change the medicine.  For stress incontinence, surgery to re-establish anatomical support to the bladder or sphincter, or both, will often correct the condition.  For overflow incontinence caused by an enlarged prostate, an operation to open the channel through the enlarged prostate will allow the flow of urine out of the bladder. In women with fibroids, a hysterectomy may be recommended.  For total incontinence, surgery on your urinary sphincter may help. An artificial urinary sphincter (an inflatable cuff placed around the urethra) may be required. In women who have developed a hole-like passage between their bladder and vagina (vesicovaginal fistula), surgery to close the fistula often is required.  Follow these instructions at home:  Normal daily hygiene and the use of pads or adult diapers that are changed regularly will help prevent odors and skin damage.  Avoid caffeine. It can overstimulate your bladder.  Use the bathroom regularly. Try about every 2-3 hours to go to the bathroom, even if you do not feel the need to do so. Take time to empty your bladder completely. After urinating, wait a minute. Then try to urinate again.  For causes involving nerve dysfunction, keep a log of the medicines you take and a journal of the times you go to the bathroom. Contact a health care provider if:  You experience worsening of pain instead of improvement in pain after your procedure.  Your incontinence becomes  worse instead of better. Get help right away if:  You experience fever or shaking chills.  You are unable to pass your urine.  You have redness spreading into your groin or down into your thighs. This information is not intended to  replace advice given to you by your health care provider. Make sure you discuss any questions you have with your health care provider. Document Released: 11/12/2004 Document Revised: 05/15/2016 Document Reviewed: 03/14/2013 Elsevier Interactive Patient Education  [employer].

## 2017-07-31 DIAGNOSIS — R350 Frequency of micturition: Secondary | ICD-10-CM | POA: Insufficient documentation

## 2017-08-16 ENCOUNTER — Other Ambulatory Visit: Payer: Self-pay | Admitting: Family Medicine

## 2017-08-16 DIAGNOSIS — I10 Essential (primary) hypertension: Secondary | ICD-10-CM

## 2017-08-16 MED FILL — ?PRAVASTATIN SODIUM 40MG TA: 40 | 30 days supply | Qty: 30 | Fill #5

## 2017-08-16 MED FILL — AMLODIPINE BESYLATE 10 MG T: 10 | 30 days supply | Qty: 30 | Fill #5

## 2017-08-16 MED FILL — LISINOPRIL 20 MG TAB: 20 | 30 days supply | Qty: 30 | Fill #5

## 2017-08-16 MED FILL — HYDROCHLOROTHIAZIDE 25 MG T: 25 | 30 days supply | Qty: 30 | Fill #0

## 2017-10-04 ENCOUNTER — Other Ambulatory Visit: Payer: Self-pay | Admitting: Family Medicine

## 2017-10-04 DIAGNOSIS — I10 Essential (primary) hypertension: Secondary | ICD-10-CM

## 2017-10-18 MED FILL — LISINOPRIL 20 MG TAB: 20 | 30 days supply | Qty: 30 | Fill #6

## 2017-10-18 MED FILL — HYDROCHLOROTHIAZIDE 25 MG T: 25 | 30 days supply | Qty: 30 | Fill #0

## 2017-11-23 ENCOUNTER — Other Ambulatory Visit: Payer: Self-pay | Admitting: Family Medicine

## 2017-11-23 DIAGNOSIS — I10 Essential (primary) hypertension: Secondary | ICD-10-CM

## 2017-11-23 MED FILL — LISINOPRIL 20 MG TAB: 20 | 30 days supply | Qty: 30 | Fill #7

## 2017-11-23 MED FILL — HYDROCHLOROTHIAZIDE 25 MG T: 25 | 30 days supply | Qty: 30 | Fill #0

## 2017-11-25 DIAGNOSIS — R350 Frequency of micturition: Secondary | ICD-10-CM | POA: Diagnosis not present

## 2017-11-25 DIAGNOSIS — R351 Nocturia: Secondary | ICD-10-CM | POA: Diagnosis not present

## 2017-12-07 DIAGNOSIS — R350 Frequency of micturition: Secondary | ICD-10-CM | POA: Diagnosis not present

## 2017-12-07 DIAGNOSIS — R351 Nocturia: Secondary | ICD-10-CM | POA: Diagnosis not present

## 2017-12-14 DIAGNOSIS — N311 Reflex neuropathic bladder, not elsewhere classified: Secondary | ICD-10-CM | POA: Diagnosis not present

## 2017-12-27 ENCOUNTER — Other Ambulatory Visit: Payer: Self-pay | Admitting: Family Medicine

## 2017-12-27 DIAGNOSIS — I10 Essential (primary) hypertension: Secondary | ICD-10-CM

## 2017-12-27 MED FILL — LISINOPRIL 20 MG TAB: 20 | 30 days supply | Qty: 30 | Fill #8

## 2018-01-25 ENCOUNTER — Ambulatory Visit: Payer: Medicare Other | Admitting: Family Medicine

## 2018-02-02 ENCOUNTER — Other Ambulatory Visit: Payer: Self-pay

## 2018-02-02 DIAGNOSIS — I10 Essential (primary) hypertension: Secondary | ICD-10-CM

## 2018-02-02 MED ORDER — LISINOPRIL 20 MG PO TABS
20.0000 mg | ORAL_TABLET | Freq: Every day | ORAL | 1 refills | Status: DC
Start: 2018-02-02 — End: 2018-09-27

## 2018-02-02 MED FILL — LISINOPRIL 20 MG TAB: 20 | 30 days supply | Qty: 30 | Fill #0

## 2018-02-02 NOTE — Telephone Encounter (Signed)
Medication refilled

## 2018-03-11 MED FILL — LISINOPRIL 20 MG TAB: 20 | 30 days supply | Qty: 30 | Fill #1

## 2018-04-26 MED FILL — LISINOPRIL 20 MG TAB: 20 | 30 days supply | Qty: 30 | Fill #2

## 2018-06-01 MED FILL — LISINOPRIL 20 MG TAB: 20 | 30 days supply | Qty: 30 | Fill #3

## 2018-07-07 MED FILL — LISINOPRIL 20 MG TAB: 20 | 30 days supply | Qty: 30 | Fill #4

## 2018-08-10 MED FILL — LISINOPRIL 20 MG TAB: 20 | 30 days supply | Qty: 30 | Fill #5

## 2018-09-19 ENCOUNTER — Other Ambulatory Visit: Payer: Self-pay | Admitting: Internal Medicine

## 2018-09-19 DIAGNOSIS — I10 Essential (primary) hypertension: Secondary | ICD-10-CM

## 2018-09-20 ENCOUNTER — Encounter: Payer: Self-pay | Admitting: Family Medicine

## 2018-09-20 ENCOUNTER — Ambulatory Visit (INDEPENDENT_AMBULATORY_CARE_PROVIDER_SITE_OTHER): Payer: Medicare Other | Admitting: Family Medicine

## 2018-09-20 VITALS — BP 169/82 | HR 84 | Temp 98.2°F | Resp 16 | Ht 65.0 in | Wt 171.0 lb

## 2018-09-20 DIAGNOSIS — M5431 Sciatica, right side: Secondary | ICD-10-CM

## 2018-09-20 MED ORDER — METHYLPREDNISOLONE SODIUM SUCC 125 MG IJ SOLR
125.0000 mg | Freq: Once | INTRAMUSCULAR | Status: AC
  Administered 2018-09-20: 125 mg via INTRAMUSCULAR

## 2018-09-20 MED ORDER — IBUPROFEN 800 MG PO TABS: 800.0000 mg | ORAL_TABLET | Freq: Three times a day (TID) | ORAL | 0 refills | Status: DC | PRN

## 2018-09-20 MED ORDER — TRAMADOL HCL 50 MG PO TABS: 50.0000 mg | ORAL_TABLET | Freq: Three times a day (TID) | ORAL | 0 refills | Status: AC | PRN

## 2018-09-20 MED FILL — traMADol HCL 50 MG TABS: 50 | 5 days supply | Qty: 15 | Fill #0

## 2018-09-20 MED FILL — IBUPROFEN 800 MG TABLET: 800 | 10 days supply | Qty: 30 | Fill #0

## 2018-09-20 NOTE — Patient Instructions (Signed)
You received a shot of a steroid in the office today. This will help with the inflammation that is associated with sciatica.  I also sent a NSAID (ibuprofen) to the pharmacy. This will help with pain and inflammation.  Continue with your stretches.  Tramadol was given for severe pain. NSAIDs work best for this.    Sciatica Sciatica is pain, numbness, weakness, or tingling along your sciatic nerve. The sciatic nerve starts in the lower back and goes down the back of each leg. Sciatica happens when this nerve is pinched or has pressure put on it. Sciatica usually goes away on its own or with treatment. Sometimes, sciatica may keep coming back (recur). Follow these instructions at home: Medicines  Take over-the-counter and prescription medicines only as told by your doctor.  Do not drive or use heavy machinery while taking prescription pain medicine. Managing pain  If directed, put ice on the affected area. ? Put ice in a plastic bag. ? Place a towel between your skin and the bag. ? Leave the ice on for 20 minutes, 2-3 times a day.  After icing, apply heat to the affected area before you exercise or as often as told by your doctor. Use the heat source that your doctor tells you to use, such as a moist heat pack or a heating pad. ? Place a towel between your skin and the heat source. ? Leave the heat on for 20-30 minutes. ? Remove the heat if your skin turns bright red. This is especially important if you are unable to feel pain, heat, or cold. You may have a greater risk of getting burned. Activity  Return to your normal activities as told by your doctor. Ask your doctor what activities are safe for you. ? Avoid activities that make your sciatica worse.  Take short rests during the day. Rest in a lying or standing position. This is usually better than sitting to rest. ? When you rest for a long time, do some physical activity or stretching between periods of rest. ? Avoid sitting for a  long time without moving. Get up and move around at least one time each hour.  Exercise and stretch regularly, as told by your doctor.  Do not lift anything that is heavier than 10 lb (4.5 kg) while you have symptoms of sciatica. ? Avoid lifting heavy things even when you do not have symptoms. ? Avoid lifting heavy things over and over.  When you lift objects, always lift in a way that is safe for your body. To do this, you should: ? Bend your knees. ? Keep the object close to your body. ? Avoid twisting. General instructions  Use good posture. ? Avoid leaning forward when you are sitting. ? Avoid hunching over when you are standing.  Stay at a healthy weight.  Wear comfortable shoes that support your feet. Avoid wearing high heels.  Avoid sleeping on a mattress that is too soft or too hard. You might have less pain if you sleep on a mattress that is firm enough to support your back.  Keep all follow-up visits as told by your doctor. This is important. Contact a doctor if:  You have pain that: ? Wakes you up when you are sleeping. ? Gets worse when you lie down. ? Is worse than the pain you have had in the past. ? Lasts longer than 4 weeks.  You lose weight for without trying. Get help right away if:  You cannot control when  you pee (urinate) or poop (have a bowel movement).  You have weakness in any of these areas and it gets worse. ? Lower back. ? Lower belly (pelvis). ? Butt (buttocks). ? Legs.  You have redness or swelling of your back.  You have a burning feeling when you pee. This information is not intended to replace advice given to you by your health care provider. Make sure you discuss any questions you have with your health care provider. Document Released: 07/14/2008 Document Revised: 03/12/2016 Document Reviewed: 06/14/2015 Elsevier Interactive Patient Education  [employer].

## 2018-09-20 NOTE — Progress Notes (Signed)
Acute Office Visit  Subjective:    Patient ID: Daniel Haynes, male    DOB: 06/13/1951, 67 y.o.   MRN: [id number]  Chief Complaint  Patient presents with  . Hip Pain    right hip pain     Back Pain  This is a new problem. The current episode started 1 to 4 weeks ago. The problem occurs constantly. The problem has been gradually worsening since onset. The pain is present in the lumbar spine and gluteal. The pain radiates to the right thigh. The pain is at a severity of 6/10. The pain is moderate. The pain is worse during the day. The symptoms are aggravated by bending, position, sitting, standing and twisting. Stiffness is present all day. Associated symptoms include tingling. Pertinent negatives include no bladder incontinence, bowel incontinence, numbness, perianal numbness or weakness. Risk factors include poor posture. He has tried home exercises for the symptoms. The treatment provided mild relief.     Past Medical History:  Diagnosis Date  . Arthritis    hands fingers wrists   . Gout   . Hyperlipidemia   . Hypertension     Past Surgical History:  Procedure Laterality Date  . CERVICAL DISCECTOMY  2004   [facility]    Family History  Problem Relation Age of Onset  . Cancer Mother   . Cancer Father   . Colon cancer Neg Hx   . Colon polyps Neg Hx   . Esophageal cancer Neg Hx   . Rectal cancer Neg Hx   . Stomach cancer Neg Hx     Social History   Socioeconomic History  . Marital status: Single    Spouse name: Not on file  . Number of children: Not on file  . Years of education: Not on file  . Highest education level: Not on file  Occupational History  . Not on file  Social Needs  . Financial resource strain: Not on file  . Food insecurity:    Worry: Not on file    Inability: Not on file  . Transportation needs:    Medical: Not on file    Non-medical: Not on file  Tobacco Use  . Smoking status: Former Research scientist (life sciences)  . Smokeless tobacco: Never Used  Substance and  Sexual Activity  . Alcohol use: Yes    Alcohol/week: 21.0 standard drinks    Types: 21 Shots of liquor per week    Comment: 3 a day   . Drug use: Yes    Types: Marijuana    Comment: occ use   . Sexual activity: Not on file  Lifestyle  . Physical activity:    Days per week: Not on file    Minutes per session: Not on file  . Stress: Not on file  Relationships  . Social connections:    Talks on phone: Not on file    Gets together: Not on file    Attends religious service: Not on file    Active member of club or organization: Not on file    Attends meetings of clubs or organizations: Not on file    Relationship status: Not on file  . Intimate partner violence:    Fear of current or ex partner: Not on file    Emotionally abused: Not on file    Physically abused: Not on file    Forced sexual activity: Not on file  Other Topics Concern  . Not on file  Social History Narrative  . Not on file  Outpatient Medications Prior to Visit  Medication Sig Dispense Refill  . lisinopril (PRINIVIL,ZESTRIL) 20 MG tablet Take 1 tablet (20 mg total) by mouth daily. 90 tablet 1  . amLODipine (NORVASC) 10 MG tablet Take 1 tablet (10 mg total) by mouth daily. (Patient not taking: Reported on 09/20/2018) 90 tablet 3  . colchicine 0.6 MG tablet Take 2 tablets once, then 0ne tablet one hour later. (Patient not taking: Reported on 09/20/2018) 3 tablet 0  . hydrochlorothiazide (HYDRODIURIL) 25 MG tablet TAKE 1 TABLET BY MOUTH DAILY. (Patient not taking: Reported on 09/20/2018) 30 tablet 0  . pravastatin (PRAVACHOL) 40 MG tablet Take 1 tablet (40 mg total) by mouth daily. (Patient not taking: Reported on 09/20/2018) 90 tablet 1   No facility-administered medications prior to visit.     No Known Allergies  Review of Systems  Gastrointestinal: Negative for bowel incontinence.  Genitourinary: Negative for bladder incontinence.  Musculoskeletal: Positive for back pain.  Neurological: Positive for  tingling. Negative for weakness and numbness.  All other systems reviewed and are negative.      Objective:    Physical Exam  Constitutional: He is oriented to person, place, and time. He appears well-developed and well-nourished. No distress.  Cardiovascular: Normal rate.  Pulmonary/Chest: Effort normal.  Musculoskeletal:       Lumbar back: He exhibits decreased range of motion, tenderness, pain and spasm.  Neurological: He is alert and oriented to person, place, and time.  Skin: Skin is warm and dry.  Psychiatric: He has a normal mood and affect. His behavior is normal. Judgment and thought content normal.  Nursing note and vitals reviewed.   BP (!) 169/82 (BP Location: Left Arm, Patient Position: Sitting, Cuff Size: Large)   Pulse 84   Temp 98.2 F (36.8 C) (Oral)   Resp 16   Ht 5\' 5"  (1.651 m)   Wt 171 lb (77.6 kg)   SpO2 99%   BMI 28.46 kg/m  Wt Readings from Last 3 Encounters:  09/20/18 171 lb (77.6 kg)  07/27/17 176 lb (79.8 kg)  06/29/17 178 lb (80.7 kg)    Health Maintenance Due  Topic Date Due  . PNA vac Low Risk Adult (2 of 2 - PPSV23) 12/28/2017  . INFLUENZA VACCINE  05/19/2018    There are no preventive care reminders to display for this patient.   No results found for: TSH Lab Results  Component Value Date   WBC 5.2 06/29/2017   HGB 13.7 06/29/2017   HCT 40.3 06/29/2017   MCV 87.4 06/29/2017   PLT 284 06/29/2017   Lab Results  Component Value Date   NA 139 06/29/2017   K 4.2 06/29/2017   CO2 23 06/29/2017   GLUCOSE 94 06/29/2017   BUN 17 06/29/2017   CREATININE 1.49 (H) 06/29/2017   BILITOT 0.6 06/29/2017   ALKPHOS 81 12/28/2016   AST 19 06/29/2017   ALT 17 06/29/2017   PROT 7.1 06/29/2017   ALBUMIN 4.2 12/28/2016   CALCIUM 9.2 06/29/2017   ANIONGAP 13 12/30/2015   Lab Results  Component Value Date   CHOL 250 (H) 12/28/2016   Lab Results  Component Value Date   HDL 49 12/28/2016   Lab Results  Component Value Date   LDLCALC  152 (H) 12/28/2016   Lab Results  Component Value Date   TRIG 246 (H) 12/28/2016   Lab Results  Component Value Date   CHOLHDL 5.1 (H) 12/28/2016   Lab Results  Component Value Date  HGBA1C 5.3 06/29/2017       Assessment & Plan:   Problem List Items Addressed This Visit    None    Visit Diagnoses    Sciatica of right side    -  Primary   Relevant Medications   methylPREDNISolone sodium succinate (SOLU-MEDROL) 125 mg/2 mL injection 125 mg (Completed)   ibuprofen (ADVIL,MOTRIN) 800 MG tablet   traMADol (ULTRAM) 50 MG tablet       Meds ordered this encounter  Medications  . methylPREDNISolone sodium succinate (SOLU-MEDROL) 125 mg/2 mL injection 125 mg  . ibuprofen (ADVIL,MOTRIN) 800 MG tablet    Sig: Take 1 tablet (800 mg total) by mouth every 8 (eight) hours as needed.    Dispense:  30 tablet    Refill:  0  . traMADol (ULTRAM) 50 MG tablet    Sig: Take 1 tablet (50 mg total) by mouth every 8 (eight) hours as needed for up to 5 days for moderate pain.    Dispense:  15 tablet    Refill:  0     Lanae Boast, FNP

## 2018-09-27 ENCOUNTER — Telehealth: Payer: Self-pay

## 2018-09-27 ENCOUNTER — Other Ambulatory Visit: Payer: Self-pay

## 2018-09-27 DIAGNOSIS — I10 Essential (primary) hypertension: Secondary | ICD-10-CM

## 2018-09-27 MED ORDER — LISINOPRIL 20 MG PO TABS: 20.0000 mg | ORAL_TABLET | Freq: Every day | ORAL | 1 refills | Status: DC

## 2018-09-27 MED FILL — LISINOPRIL 20 MG TAB: 20 | 30 days supply | Qty: 30 | Fill #0

## 2018-09-27 NOTE — Telephone Encounter (Signed)
Refill for lisinopril sent into pharmacy. Thanks!  

## 2018-10-28 MED FILL — LISINOPRIL 20 MG TAB: 20 | 30 days supply | Qty: 30 | Fill #1

## 2018-11-03 ENCOUNTER — Ambulatory Visit (INDEPENDENT_AMBULATORY_CARE_PROVIDER_SITE_OTHER): Payer: Medicare Other | Admitting: Family Medicine

## 2018-11-03 ENCOUNTER — Encounter: Payer: Self-pay | Admitting: Family Medicine

## 2018-11-03 VITALS — BP 160/100 | HR 70 | Temp 98.3°F | Resp 14 | Ht 65.0 in | Wt 168.0 lb

## 2018-11-03 DIAGNOSIS — I10 Essential (primary) hypertension: Secondary | ICD-10-CM

## 2018-11-03 DIAGNOSIS — M5431 Sciatica, right side: Secondary | ICD-10-CM

## 2018-11-03 MED ORDER — CLONIDINE HCL 0.1 MG PO TABS
0.2000 mg | ORAL_TABLET | Freq: Once | ORAL | Status: AC
  Administered 2018-11-03: 0.2 mg via ORAL

## 2018-11-03 MED ORDER — MELOXICAM 15 MG PO TABS: 15.0000 mg | ORAL_TABLET | Freq: Every day | ORAL | 0 refills | Status: DC

## 2018-11-03 MED ORDER — TRAMADOL HCL 50 MG PO TABS: 50.0000 mg | ORAL_TABLET | Freq: Three times a day (TID) | ORAL | 0 refills | Status: DC | PRN

## 2018-11-03 MED ORDER — HYDROCHLOROTHIAZIDE 25 MG PO TABS: 25.0000 mg | ORAL_TABLET | Freq: Every day | ORAL | 3 refills | Status: DC

## 2018-11-03 MED ORDER — KETOROLAC TROMETHAMINE 60 MG/2ML IM SOLN
60.0000 mg | Freq: Once | INTRAMUSCULAR | Status: AC
  Administered 2018-11-03: 60 mg via INTRAMUSCULAR

## 2018-11-03 MED ORDER — CYCLOBENZAPRINE HCL 10 MG PO TABS: 10.0000 mg | ORAL_TABLET | Freq: Every day | ORAL | 0 refills | Status: DC

## 2018-11-03 MED ORDER — AMLODIPINE BESYLATE 10 MG PO TABS: 10.0000 mg | ORAL_TABLET | Freq: Every day | ORAL | 3 refills | Status: DC

## 2018-11-03 MED ORDER — LISINOPRIL 20 MG PO TABS: 20.0000 mg | ORAL_TABLET | Freq: Every day | ORAL | 3 refills | Status: DC

## 2018-11-03 MED FILL — MELOXICAM 15 MG TABLET: 15 | 30 days supply | Qty: 30 | Fill #0

## 2018-11-03 MED FILL — HYDROCHLOROTHIAZIDE 25 MG T: 25 | 30 days supply | Qty: 30 | Fill #0

## 2018-11-03 MED FILL — AMLODIPINE BESYLATE 10 MG T: 10 | 30 days supply | Qty: 30 | Fill #0

## 2018-11-03 MED FILL — CYCLOBENZAPRINE 10 MG TAB: 10 | 30 days supply | Qty: 30 | Fill #0

## 2018-11-03 MED FILL — traMADol HCL 50 MG TABS: 50 | 10 days supply | Qty: 30 | Fill #0

## 2018-11-03 NOTE — Progress Notes (Signed)
Acute Office Visit  Subjective:    Patient ID: Daniel Haynes, male    DOB: 07-Dec-1950, 68 y.o.   MRN: [id number]  Chief Complaint  Patient presents with  . Back Pain    spasms in right hip causing him to loos sleep   . Insomnia    HPI Patient is in today for right sided sciatic nerve pain and spasm. Patient states that he has not been able to sleep due to pain.  Patient states that he took ibuprofen and tramadol with relief. Patient states that the pain will decrease but not subside. He states that the spasms occur after sitting for long periods of time. Patient states that he was given stretches and a tennis ball with slight relief.  Patient with high BP reading today. Patient states that he has not been sleeping, in pain and under a great amount of stress. He also states that he also decrease bike riding due to pain Has not been following a DASH diet. Has been consuming increased amounts of fast foods due to schedule.   Past Medical History:  Diagnosis Date  . Arthritis    hands fingers wrists   . Gout   . Hyperlipidemia   . Hypertension     Past Surgical History:  Procedure Laterality Date  . CERVICAL DISCECTOMY  2004   [facility]    Family History  Problem Relation Age of Onset  . Cancer Mother   . Cancer Father   . Colon cancer Neg Hx   . Colon polyps Neg Hx   . Esophageal cancer Neg Hx   . Rectal cancer Neg Hx   . Stomach cancer Neg Hx     Social History   Socioeconomic History  . Marital status: Single    Spouse name: Not on file  . Number of children: Not on file  . Years of education: Not on file  . Highest education level: Not on file  Occupational History  . Not on file  Social Needs  . Financial resource strain: Not on file  . Food insecurity:    Worry: Not on file    Inability: Not on file  . Transportation needs:    Medical: Not on file    Non-medical: Not on file  Tobacco Use  . Smoking status: Former Research scientist (life sciences)  . Smokeless tobacco: Never Used   Substance and Sexual Activity  . Alcohol use: Yes    Alcohol/week: 21.0 standard drinks    Types: 21 Shots of liquor per week    Comment: 3 a day   . Drug use: Yes    Types: Marijuana    Comment: occ use   . Sexual activity: Not on file  Lifestyle  . Physical activity:    Days per week: Not on file    Minutes per session: Not on file  . Stress: Not on file  Relationships  . Social connections:    Talks on phone: Not on file    Gets together: Not on file    Attends religious service: Not on file    Active member of club or organization: Not on file    Attends meetings of clubs or organizations: Not on file    Relationship status: Not on file  . Intimate partner violence:    Fear of current or ex partner: Not on file    Emotionally abused: Not on file    Physically abused: Not on file    Forced sexual activity: Not on file  Other Topics Concern  . Not on file  Social History Narrative  . Not on file    Outpatient Medications Prior to Visit  Medication Sig Dispense Refill  . lisinopril (PRINIVIL,ZESTRIL) 20 MG tablet Take 1 tablet (20 mg total) by mouth daily. 90 tablet 1  . colchicine 0.6 MG tablet Take 2 tablets once, then 0ne tablet one hour later. (Patient not taking: Reported on 09/20/2018) 3 tablet 0  . ibuprofen (ADVIL,MOTRIN) 800 MG tablet Take 1 tablet (800 mg total) by mouth every 8 (eight) hours as needed. (Patient not taking: Reported on 11/03/2018) 30 tablet 0  . pravastatin (PRAVACHOL) 40 MG tablet Take 1 tablet (40 mg total) by mouth daily. (Patient not taking: Reported on 09/20/2018) 90 tablet 1  . amLODipine (NORVASC) 10 MG tablet Take 1 tablet (10 mg total) by mouth daily. (Patient not taking: Reported on 09/20/2018) 90 tablet 3  . hydrochlorothiazide (HYDRODIURIL) 25 MG tablet TAKE 1 TABLET BY MOUTH DAILY. (Patient not taking: Reported on 09/20/2018) 30 tablet 0   No facility-administered medications prior to visit.     No Known Allergies  ROS      Objective:    Physical Exam  BP (!) 160/100 Comment: manually  Pulse 70   Temp 98.3 F (36.8 C) (Oral)   Resp 14   Ht 5\' 5"  (1.651 m)   Wt 168 lb (76.2 kg)   SpO2 99%   BMI 27.96 kg/m  Wt Readings from Last 3 Encounters:  11/03/18 168 lb (76.2 kg)  09/20/18 171 lb (77.6 kg)  07/27/17 176 lb (79.8 kg)    Health Maintenance Due  Topic Date Due  . PNA vac Low Risk Adult (2 of 2 - PPSV23) 12/28/2017  . INFLUENZA VACCINE  05/19/2018    There are no preventive care reminders to display for this patient.   No results found for: TSH Lab Results  Component Value Date   WBC 5.2 06/29/2017   HGB 13.7 06/29/2017   HCT 40.3 06/29/2017   MCV 87.4 06/29/2017   PLT 284 06/29/2017   Lab Results  Component Value Date   NA 139 06/29/2017   K 4.2 06/29/2017   CO2 23 06/29/2017   GLUCOSE 94 06/29/2017   BUN 17 06/29/2017   CREATININE 1.49 (H) 06/29/2017   BILITOT 0.6 06/29/2017   ALKPHOS 81 12/28/2016   AST 19 06/29/2017   ALT 17 06/29/2017   PROT 7.1 06/29/2017   ALBUMIN 4.2 12/28/2016   CALCIUM 9.2 06/29/2017   ANIONGAP 13 12/30/2015   Lab Results  Component Value Date   CHOL 250 (H) 12/28/2016   Lab Results  Component Value Date   HDL 49 12/28/2016   Lab Results  Component Value Date   LDLCALC 152 (H) 12/28/2016   Lab Results  Component Value Date   TRIG 246 (H) 12/28/2016   Lab Results  Component Value Date   CHOLHDL 5.1 (H) 12/28/2016   Lab Results  Component Value Date   HGBA1C 5.3 06/29/2017       Assessment & Plan:   Problem List Items Addressed This Visit      Cardiovascular and Mediastinum   Accelerated hypertension   Relevant Medications   cloNIDine (CATAPRES) tablet 0.2 mg (Completed)   hydrochlorothiazide (HYDRODIURIL) 25 MG tablet   amLODipine (NORVASC) 10 MG tablet   lisinopril (PRINIVIL,ZESTRIL) 20 MG tablet    Other Visit Diagnoses    Sciatica of right side    -  Primary   Relevant Medications  cyclobenzaprine (FLEXERIL)  10 MG tablet   traMADol (ULTRAM) 50 MG tablet   meloxicam (MOBIC) 15 MG tablet   ketorolac (TORADOL) injection 60 mg (Completed)   Hypertension, unspecified type       Relevant Medications   cloNIDine (CATAPRES) tablet 0.2 mg (Completed)   hydrochlorothiazide (HYDRODIURIL) 25 MG tablet   amLODipine (NORVASC) 10 MG tablet   lisinopril (PRINIVIL,ZESTRIL) 20 MG tablet       Meds ordered this encounter  Medications  . cloNIDine (CATAPRES) tablet 0.2 mg  . cyclobenzaprine (FLEXERIL) 10 MG tablet    Sig: Take 1 tablet (10 mg total) by mouth at bedtime.    Dispense:  30 tablet    Refill:  0  . traMADol (ULTRAM) 50 MG tablet    Sig: Take 1 tablet (50 mg total) by mouth every 8 (eight) hours as needed.    Dispense:  30 tablet    Refill:  0  . meloxicam (MOBIC) 15 MG tablet    Sig: Take 1 tablet (15 mg total) by mouth daily.    Dispense:  30 tablet    Refill:  0  . ketorolac (TORADOL) injection 60 mg  . hydrochlorothiazide (HYDRODIURIL) 25 MG tablet    Sig: Take 1 tablet (25 mg total) by mouth daily.    Dispense:  90 tablet    Refill:  3  . amLODipine (NORVASC) 10 MG tablet    Sig: Take 1 tablet (10 mg total) by mouth daily.    Dispense:  90 tablet    Refill:  3  . lisinopril (PRINIVIL,ZESTRIL) 20 MG tablet    Sig: Take 1 tablet (20 mg total) by mouth daily.    Dispense:  90 tablet    Refill:  3   1. Accelerated hypertension Patient given 0.2 mg clonidine in the office. 30 minutes later, BP 200/112. Patient given toradol 60 mg IM to help with pain.  Patient reports not taking HCTZ.  - cloNIDine (CATAPRES) tablet 0.2 mg  2. Sciatica of right side Discussed and demonstrated stretches for low back pain.  - cyclobenzaprine (FLEXERIL) 10 MG tablet; Take 1 tablet (10 mg total) by mouth at bedtime.  Dispense: 30 tablet; Refill: 0 - traMADol (ULTRAM) 50 MG tablet; Take 1 tablet (50 mg total) by mouth every 8 (eight) hours as needed.  Dispense: 30 tablet; Refill: 0 - meloxicam  (MOBIC) 15 MG tablet; Take 1 tablet (15 mg total) by mouth daily.  Dispense: 30 tablet; Refill: 0 - ketorolac (TORADOL) injection 60 mg   Lanae Boast, FNP

## 2018-11-03 NOTE — Patient Instructions (Signed)
Sciatica    Sciatica is pain, numbness, weakness, or tingling along your sciatic nerve. The sciatic nerve starts in the lower back and goes down the back of each leg. Sciatica happens when this nerve is pinched or has pressure put on it. Sciatica usually goes away on its own or with treatment. Sometimes, sciatica may keep coming back (recur).  Follow these instructions at home:  Medicines  · Take over-the-counter and prescription medicines only as told by your doctor.  · Do not drive or use heavy machinery while taking prescription pain medicine.  Managing pain  · If directed, put ice on the affected area.  ? Put ice in a plastic bag.  ? Place a towel between your skin and the bag.  ? Leave the ice on for 20 minutes, 2-3 times a day.  · After icing, apply heat to the affected area before you exercise or as often as told by your doctor. Use the heat source that your doctor tells you to use, such as a moist heat pack or a heating pad.  ? Place a towel between your skin and the heat source.  ? Leave the heat on for 20-30 minutes.  ? Remove the heat if your skin turns bright red. This is especially important if you are unable to feel pain, heat, or cold. You may have a greater risk of getting burned.  Activity  · Return to your normal activities as told by your doctor. Ask your doctor what activities are safe for you.  ? Avoid activities that make your sciatica worse.  · Take short rests during the day. Rest in a lying or standing position. This is usually better than sitting to rest.  ? When you rest for a long time, do some physical activity or stretching between periods of rest.  ? Avoid sitting for a long time without moving. Get up and move around at least one time each hour.  · Exercise and stretch regularly, as told by your doctor.  · Do not lift anything that is heavier than 10 lb (4.5 kg) while you have symptoms of sciatica.  ? Avoid lifting heavy things even when you do not have symptoms.  ? Avoid lifting  heavy things over and over.  · When you lift objects, always lift in a way that is safe for your body. To do this, you should:  ? Bend your knees.  ? Keep the object close to your body.  ? Avoid twisting.  General instructions  · Use good posture.  ? Avoid leaning forward when you are sitting.  ? Avoid hunching over when you are standing.  · Stay at a healthy weight.  · Wear comfortable shoes that support your feet. Avoid wearing high heels.  · Avoid sleeping on a mattress that is too soft or too hard. You might have less pain if you sleep on a mattress that is firm enough to support your back.  · Keep all follow-up visits as told by your doctor. This is important.  Contact a doctor if:  · You have pain that:  ? Wakes you up when you are sleeping.  ? Gets worse when you lie down.  ? Is worse than the pain you have had in the past.  ? Lasts longer than 4 weeks.  · You lose weight for without trying.  Get help right away if:  · You cannot control when you pee (urinate) or poop (have a bowel movement).  · You   have weakness in any of these areas and it gets worse.  ? Lower back.  ? Lower belly (pelvis).  ? Butt (buttocks).  ? Legs.  · You have redness or swelling of your back.  · You have a burning feeling when you pee.  This information is not intended to replace advice given to you by your health care provider. Make sure you discuss any questions you have with your health care provider.  Document Released: 07/14/2008 Document Revised: 03/12/2016 Document Reviewed: 06/14/2015  Elsevier Interactive Patient Education © 2019 Elsevier Inc.

## 2018-11-17 ENCOUNTER — Encounter: Payer: Self-pay | Admitting: Family Medicine

## 2018-11-17 ENCOUNTER — Ambulatory Visit (INDEPENDENT_AMBULATORY_CARE_PROVIDER_SITE_OTHER): Payer: Medicare Other | Admitting: Family Medicine

## 2018-11-17 VITALS — BP 136/88 | HR 79 | Temp 97.9°F | Resp 16 | Ht 65.0 in | Wt 169.0 lb

## 2018-11-17 DIAGNOSIS — M5431 Sciatica, right side: Secondary | ICD-10-CM | POA: Diagnosis not present

## 2018-11-17 DIAGNOSIS — I10 Essential (primary) hypertension: Secondary | ICD-10-CM | POA: Diagnosis not present

## 2018-11-17 DIAGNOSIS — Z23 Encounter for immunization: Secondary | ICD-10-CM | POA: Diagnosis not present

## 2018-11-17 NOTE — Progress Notes (Signed)
  Patient [facility]   Progress Note: General Provider: Lanae Boast, FNP  SUBJECTIVE:   Daniel Haynes is a 68 y.o. male who  has a past medical history of Arthritis, Gout, Hyperlipidemia, and Hypertension.. Patient presents today for Follow-up (sciatica right side ) and Follow-up (htn)   Patient is doing well on current medications for htn. He denies side effects. Patient states that he is continuing to have right sided sciatic nerve pain and spasms.   Review of Systems  Constitutional: Negative.   HENT: Negative.   Eyes: Negative.   Respiratory: Negative.   Cardiovascular: Negative.   Gastrointestinal: Negative.   Genitourinary: Negative.   Musculoskeletal: Positive for back pain (right side sciatica).  Skin: Negative.   Neurological: Negative.   Psychiatric/Behavioral: Negative.      OBJECTIVE: BP 136/88 (BP Location: Right Arm, Patient Position: Sitting, Cuff Size: Large)   Pulse 79   Temp 97.9 F (36.6 C) (Oral)   Resp 16   Ht 5\' 5"  (1.651 m)   Wt 169 lb (76.7 kg)   SpO2 100%   BMI 28.12 kg/m   Wt Readings from Last 3 Encounters:  11/17/18 169 lb (76.7 kg)  11/03/18 168 lb (76.2 kg)  09/20/18 171 lb (77.6 kg)     Physical Exam Vitals signs and nursing note reviewed.  Constitutional:      General: He is not in acute distress.    Appearance: Normal appearance. He is well-developed.  HENT:     Head: Normocephalic and atraumatic.  Eyes:     Extraocular Movements: Extraocular movements intact.     Conjunctiva/sclera: Conjunctivae normal.     Pupils: Pupils are equal, round, and reactive to light.  Neck:     Musculoskeletal: Normal range of motion.  Cardiovascular:     Rate and Rhythm: Normal rate and regular rhythm.     Heart sounds: Normal heart sounds.  Pulmonary:     Effort: Pulmonary effort is normal. No respiratory distress.     Breath sounds: Normal breath sounds.  Musculoskeletal: Normal range of motion.          General: Tenderness (right lumbar region) present.  Skin:    General: Skin is warm and dry.  Neurological:     Mental Status: He is alert and oriented to person, place, and time.  Psychiatric:        Mood and Affect: Mood normal.        Behavior: Behavior normal.        Thought Content: Thought content normal.        Judgment: Judgment normal.     ASSESSMENT/PLAN:  1. Essential hypertension The current medical regimen is effective;  continue present plan and medications. The patient is asked to make an attempt to improve diet and exercise patterns to aid in medical management of this problem.   2. Sciatica of right side Offered referral to PT/OT for further evaluation. Patient declined and states that he will return if symptoms persist.     Return in about 3 months (around 02/16/2019) for htn.    The patient was given clear instructions to go to ER or return to medical center if symptoms do not improve, worsen or new problems develop. The patient verbalized understanding and agreed with plan of care.   Ms. [name], FNP-BC Patient [facility] [address] [place], [place] [address] [phone]

## 2018-11-17 NOTE — Patient Instructions (Signed)

## 2018-11-28 MED FILL — AMLODIPINE BESYLATE 10 MG T: 10 | 30 days supply | Qty: 30 | Fill #1

## 2018-11-28 MED FILL — LISINOPRIL 20 MG TAB: 20 | 30 days supply | Qty: 30 | Fill #2

## 2018-11-28 MED FILL — HYDROCHLOROTHIAZIDE 25 MG T: 25 | 30 days supply | Qty: 30 | Fill #1

## 2018-12-07 ENCOUNTER — Ambulatory Visit (INDEPENDENT_AMBULATORY_CARE_PROVIDER_SITE_OTHER): Payer: Medicare Other | Admitting: Family Medicine

## 2018-12-07 ENCOUNTER — Encounter: Payer: Self-pay | Admitting: Family Medicine

## 2018-12-07 VITALS — BP 140/88 | HR 84 | Temp 98.7°F | Resp 16 | Ht 65.0 in | Wt 166.0 lb

## 2018-12-07 DIAGNOSIS — M5431 Sciatica, right side: Secondary | ICD-10-CM

## 2018-12-07 DIAGNOSIS — N3281 Overactive bladder: Secondary | ICD-10-CM | POA: Diagnosis not present

## 2018-12-07 MED ORDER — TRAMADOL HCL 50 MG PO TABS: 50.0000 mg | ORAL_TABLET | Freq: Three times a day (TID) | ORAL | 0 refills | Status: DC | PRN

## 2018-12-07 MED ORDER — MIRABEGRON ER 25 MG PO TB24: 25.0000 mg | ORAL_TABLET | Freq: Every day | ORAL | 2 refills | Status: DC

## 2018-12-07 MED ORDER — MELOXICAM 15 MG PO TABS: 15.0000 mg | ORAL_TABLET | Freq: Every day | ORAL | 2 refills | Status: DC

## 2018-12-07 MED ORDER — GABAPENTIN 300 MG PO CAPS: 600.0000 mg | ORAL_CAPSULE | Freq: Every day | ORAL | 3 refills | Status: DC

## 2018-12-07 MED FILL — traMADol HCL 50 MG TABS: 50 | 20 days supply | Qty: 60 | Fill #0

## 2018-12-07 MED FILL — GABAPENTIN 300 MG CAPSULE: 300 | 33 days supply | Qty: 60 | Fill #0

## 2018-12-07 MED FILL — **MYRBETRIQ ER 25 MG TABLET: 25 MG | 30 days supply | Qty: 30 | Fill #0

## 2018-12-07 MED FILL — MELOXICAM 15 MG TABLET: 15 | 30 days supply | Qty: 30 | Fill #0

## 2018-12-07 NOTE — Patient Instructions (Addendum)
Sciatica  Sciatica is pain, numbness, weakness, or tingling along your sciatic nerve. The sciatic nerve starts in the lower back and goes down the back of each leg. Sciatica happens when this nerve is pinched or has pressure put on it. Sciatica usually goes away on its own or with treatment. Sometimes, sciatica may keep coming back (recur). Follow these instructions at home: Medicines  Take over-the-counter and prescription medicines only as told by your doctor.  Do not drive or use heavy machinery while taking prescription pain medicine. Managing pain  If directed, put ice on the affected area. ? Put ice in a plastic bag. ? Place a towel between your skin and the bag. ? Leave the ice on for 20 minutes, 2-3 times a day.  After icing, apply heat to the affected area before you exercise or as often as told by your doctor. Use the heat source that your doctor tells you to use, such as a moist heat pack or a heating pad. ? Place a towel between your skin and the heat source. ? Leave the heat on for 20-30 minutes. ? Remove the heat if your skin turns bright red. This is especially important if you are unable to feel pain, heat, or cold. You may have a greater risk of getting burned. Activity  Return to your normal activities as told by your doctor. Ask your doctor what activities are safe for you. ? Avoid activities that make your sciatica worse.  Take short rests during the day. Rest in a lying or standing position. This is usually better than sitting to rest. ? When you rest for a long time, do some physical activity or stretching between periods of rest. ? Avoid sitting for a long time without moving. Get up and move around at least one time each hour.  Exercise and stretch regularly, as told by your doctor.  Do not lift anything that is heavier than 10 lb (4.5 kg) while you have symptoms of sciatica. ? Avoid lifting heavy things even when you do not have symptoms. ? Avoid lifting  heavy things over and over.  When you lift objects, always lift in a way that is safe for your body. To do this, you should: ? Bend your knees. ? Keep the object close to your body. ? Avoid twisting. General instructions  Use good posture. ? Avoid leaning forward when you are sitting. ? Avoid hunching over when you are standing.  Stay at a healthy weight.  Wear comfortable shoes that support your feet. Avoid wearing high heels.  Avoid sleeping on a mattress that is too soft or too hard. You might have less pain if you sleep on a mattress that is firm enough to support your back.  Keep all follow-up visits as told by your doctor. This is important. Contact a doctor if:  You have pain that: ? Wakes you up when you are sleeping. ? Gets worse when you lie down. ? Is worse than the pain you have had in the past. ? Lasts longer than 4 weeks.  You lose weight for without trying. Get help right away if:  You cannot control when you pee (urinate) or poop (have a bowel movement).  You have weakness in any of these areas and it gets worse. ? Lower back. ? Lower belly (pelvis). ? Butt (buttocks). ? Legs.  You have redness or swelling of your back.  You have a burning feeling when you pee. This information is not intended to  replace advice given to you by your health care provider. Make sure you discuss any questions you have with your health care provider. Document Released: 07/14/2008 Document Revised: 03/12/2016 Document Reviewed: 06/14/2015 Elsevier Interactive Patient Education  2019 [employer].  Overactive Bladder, Adult  Overactive bladder refers to a condition in which a person has a sudden need to pass urine. The person may leak urine if he or she cannot get to the bathroom fast enough (urinary incontinence). A person with this condition may also wake up several times in the night to go to the bathroom. Overactive bladder is associated with poor nerve signals between  your bladder and your brain. Your bladder may get the signal to empty before it is full. You may also have very sensitive muscles that make your bladder squeeze too soon. These symptoms might interfere with daily work or social activities. What are the causes? This condition may be associated with or caused by:  Urinary tract infection.  Infection of nearby tissues, such as the prostate.  Prostate enlargement.  Surgery on the uterus or urethra.  Bladder stones, inflammation, or tumors.  Drinking too much caffeine or alcohol.  Certain medicines, especially medicines that get rid of extra fluid in the body (diuretics).  Muscle or nerve weakness, especially from: ? A spinal cord injury. ? Stroke. ? Multiple sclerosis. ? Parkinson's disease.  Diabetes.  Constipation. What increases the risk? You may be at greater risk for overactive bladder if you:  Are an older adult.  Smoke.  Are going through menopause.  Have prostate problems.  Have a neurological disease, such as stroke, dementia, Parkinson's disease, or multiple sclerosis (MS).  Eat or drink things that irritate the bladder. These include alcohol, spicy food, and caffeine.  Are overweight or obese. What are the signs or symptoms? Symptoms of this condition include:  Sudden, strong urge to urinate.  Leaking urine.  Urinating 8 or more times a day.  Waking up to urinate 2 or more times a night. How is this diagnosed? Your health care provider may suspect overactive bladder based on your symptoms. He or she will diagnose this condition by:  A physical exam and medical history.  Blood or urine tests. You might need bladder or urine tests to help determine what is causing your overactive bladder. You might also need to see a health care provider who specializes in urinary tract problems (urologist). How is this treated? Treatment for overactive bladder depends on the cause of your condition and whether it is  mild or severe. You can also make lifestyle changes at home. Options include:  Bladder training. This may include: ? Learning to control the urge to urinate by following a schedule that directs you to urinate at regular intervals (timed voiding). ? Doing Kegel exercises to strengthen your pelvic floor muscles, which support your bladder. Toning these muscles can help you control urination, even if your bladder muscles are overactive.  Special devices. This may include: ? Biofeedback, which uses sensors to help you become aware of your body's signals. ? Electrical stimulation, which uses electrodes placed inside the body (implanted) or outside the body. These electrodes send gentle pulses of electricity to strengthen the nerves or muscles that control the bladder. ? Women may use a plastic device that fits into the vagina and supports the bladder (pessary).  Medicines. ? Antibiotics to treat bladder infection. ? Antispasmodics to stop the bladder from releasing urine at the wrong time. ? Tricyclic antidepressants to relax bladder muscles. ?  Injections of botulinum toxin type A directly into the bladder tissue to relax bladder muscles.  Lifestyle changes. This may include: ? Weight loss. Talk to your health care provider about weight loss methods that would work best for you. ? Diet changes. This may include reducing how much alcohol and caffeine you consume, or drinking fluids at different times of the day. ? Not smoking. Do not use any products that contain nicotine or tobacco, such as cigarettes and e-cigarettes. If you need help quitting, ask your health care provider.  Surgery. ? A device may be implanted to help manage the nerve signals that control urination. ? An electrode may be implanted to stimulate electrical signals in the bladder. ? A procedure may be done to change the shape of the bladder. This is done only in very severe cases. Follow these instructions at  home: Lifestyle  Make any diet or lifestyle changes that are recommended by your health care provider. These may include: ? Drinking less fluid or drinking fluids at different times of the day. ? Cutting down on caffeine or alcohol. ? Doing Kegel exercises. ? Losing weight if needed. ? Eating a healthy and balanced diet to prevent constipation. This may include:  Eating foods that are high in fiber, such as fresh fruits and vegetables, whole grains, and beans.  Limiting foods that are high in fat and processed sugars, such as fried and sweet foods. General instructions  Take over-the-counter and prescription medicines only as told by your health care provider.  If you were prescribed an antibiotic medicine, take it as told by your health care provider. Do not stop taking the antibiotic even if you start to feel better.  Use any implants or pessary as told by your health care provider.  If needed, wear pads to absorb urine leakage.  Keep a journal or log to track how much and when you drink and when you feel the need to urinate. This will help your health care provider monitor your condition.  Keep all follow-up visits as told by your health care provider. This is important. Contact a health care provider if:  You have a fever.  Your symptoms do not get better with treatment.  Your pain and discomfort get worse.  You have more frequent urges to urinate. Get help right away if:  You are not able to control your bladder. Summary  Overactive bladder refers to a condition in which a person has a sudden need to pass urine.  Several conditions may lead to an overactive bladder.  Treatment for overactive bladder depends on the cause and severity of your condition.  Follow your health care provider's instructions about lifestyle changes, doing Kegel exercises, keeping a journal, and taking medicines. This information is not intended to replace advice given to you by your health  care provider. Make sure you discuss any questions you have with your health care provider. Document Released: 08/01/2009 Document Revised: 10/21/2017 Document Reviewed: 10/21/2017 Elsevier Interactive Patient Education  2019 [employer].

## 2018-12-07 NOTE — Progress Notes (Signed)
[facility]   Progress Note: Sick Visit Provider: Lanae Boast, FNP  SUBJECTIVE:   Daniel Haynes is a 68 y.o. male who  has a past medical history of Arthritis, Gout, Hyperlipidemia, and Hypertension.. Patient presents today for Follow-up (right side sciatica getting worse ); Leg Pain (right leg pain ); and Over Active Bladder Patient states that he continues to have right sided sciatic nerve pain. He states that he has been working with a friend who is a physical therapist. He states that the pain is severe and wakes him from his sleep. 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 He sits throughout the day and uses the right leg to drive.  Review of Systems  Constitutional: Negative.   HENT: Negative.   Eyes: Negative.   Respiratory: Negative.   Cardiovascular: Negative.   Gastrointestinal: Negative.   Genitourinary: Negative.   Musculoskeletal: Positive for back pain.  Skin: Negative.   Neurological: Negative.   Psychiatric/Behavioral: Negative.      OBJECTIVE: BP 140/88 (BP Location: Left Arm, Patient Position: Sitting, Cuff Size: Large)   Pulse 84   Temp 98.7 F (37.1 C) (Oral)   Resp 16   Ht 5\' 5"  (1.651 m)   Wt 166 lb (75.3 kg)   SpO2 100%   BMI 27.62 kg/m   Wt Readings from Last 3 Encounters:  12/07/18 166 lb (75.3 kg)  11/17/18 169 lb (76.7 kg)  11/03/18 168 lb (76.2 kg)     Physical Exam Vitals signs and nursing note reviewed.  Constitutional:      General: He is not in acute distress.    Appearance: He is well-developed.  HENT:     Head: Normocephalic and atraumatic.  Eyes:     Conjunctiva/sclera: Conjunctivae normal.     Pupils: Pupils are equal, round, and reactive to light.  Neck:     Musculoskeletal: Normal range of motion.  Cardiovascular:     Rate and Rhythm: Normal rate and regular rhythm.     Heart sounds: Normal heart sounds.  Pulmonary:     Effort: Pulmonary effort is normal. No respiratory distress.   Breath sounds: Normal breath sounds.  Abdominal:     General: Bowel sounds are normal. There is no distension.     Palpations: Abdomen is soft.  Musculoskeletal:        General: Tenderness (right lumbar) present.  Skin:    General: Skin is warm and dry.  Neurological:     Mental Status: He is alert and oriented to person, place, and time.  Psychiatric:        Mood and Affect: Mood normal.        Behavior: Behavior normal.        Thought Content: Thought content normal.        Judgment: Judgment normal.     ASSESSMENT/PLAN:   1. Sciatica of right side Start gabapentin for nerve pain.  - gabapentin (NEURONTIN) 300 MG capsule; Take 2 capsules (600 mg total) by mouth at bedtime. 1 cap x 1 week then increase to 2 caps po qhs  Dispense: 60 capsule; Refill: 3 - Ambulatory referral to Sports Medicine - traMADol (ULTRAM) 50 MG tablet; Take 1 tablet (50 mg total) by mouth every 8 (eight) hours as needed.  Dispense: 60 tablet; Refill: 0 - meloxicam (MOBIC) 15 MG tablet; Take 1 tablet (15 mg total) by mouth daily.  Dispense: 30 tablet; Refill: 2  2. Overactive bladder Patient reports being seen  by urology in the past. Was on myrbetriq in the past via samples.  - mirabegron ER (MYRBETRIQ) 25 MG TB24 tablet; Take 1 tablet (25 mg total) by mouth daily.  Dispense: 30 tablet; Refill: 2        The patient was given clear instructions to go to ER or return to medical center if symptoms do not improve, worsen or new problems develop. The patient verbalized understanding and agreed with plan of care.   Ms. [name], FNP-BC Patient [facility] [address] [place], [place] [address] [phone]     This note has been created with Dragon speech recognition software and smart phrase technology. Any transcriptional errors are unintentional.

## 2018-12-29 MED FILL — HYDROCHLOROTHIAZIDE 25 MG T: 25 | 30 days supply | Qty: 30 | Fill #2

## 2018-12-29 MED FILL — LISINOPRIL 20 MG TAB: 20 | 30 days supply | Qty: 30 | Fill #3

## 2018-12-29 MED FILL — AMLODIPINE BESYLATE 10 MG T: 10 | 30 days supply | Qty: 30 | Fill #2

## 2019-01-16 MED FILL — **MYRBETRIQ ER 25 MG TABLET: 25 MG | 30 days supply | Qty: 30 | Fill #1

## 2019-01-16 MED FILL — GABAPENTIN 300 MG CAPSULE: 300 | 33 days supply | Qty: 60 | Fill #1

## 2019-01-16 MED FILL — MELOXICAM 15 MG TABLET: 15 | 30 days supply | Qty: 30 | Fill #1

## 2019-01-24 ENCOUNTER — Telehealth: Payer: Self-pay

## 2019-01-24 DIAGNOSIS — M5431 Sciatica, right side: Secondary | ICD-10-CM

## 2019-01-24 NOTE — Telephone Encounter (Signed)
Patient states that he is having burning from the hip down. Patient states that pharmacist states that he needs a muscle relaxer. Patient was on Flexeril and would like to have sent to [place]. Patient is aware that you are out of office today.

## 2019-01-25 NOTE — Telephone Encounter (Signed)
Tired to contact patient no answer and no vm

## 2019-01-26 NOTE — Telephone Encounter (Signed)
Called patient again no answer and no vm

## 2019-01-26 NOTE — Telephone Encounter (Signed)
Tried to contact patient no answer 

## 2019-01-28 MED FILL — GABAPENTIN 300 MG CAPSULE: 300 | 33 days supply | Qty: 60 | Fill #2

## 2019-02-10 ENCOUNTER — Other Ambulatory Visit: Payer: Self-pay | Admitting: Family Medicine

## 2019-02-10 DIAGNOSIS — M5431 Sciatica, right side: Secondary | ICD-10-CM

## 2019-02-10 MED FILL — LISINOPRIL 20 MG TAB: 20 | 30 days supply | Qty: 30 | Fill #4

## 2019-02-10 MED FILL — CYCLOBENZAPRINE 10 MG TAB: 10 | 30 days supply | Qty: 30 | Fill #0

## 2019-02-10 NOTE — Telephone Encounter (Signed)
Daniel Haynes, Please advise if this can be refilled. Thanks!

## 2019-02-11 MED FILL — HYDROCHLOROTHIAZIDE 25 MG T: 25 | 30 days supply | Qty: 30 | Fill #3

## 2019-02-16 ENCOUNTER — Encounter: Payer: Self-pay | Admitting: Family Medicine

## 2019-02-16 ENCOUNTER — Other Ambulatory Visit: Payer: Self-pay

## 2019-02-16 ENCOUNTER — Ambulatory Visit (INDEPENDENT_AMBULATORY_CARE_PROVIDER_SITE_OTHER): Payer: Medicare Other | Admitting: Family Medicine

## 2019-02-16 DIAGNOSIS — M5431 Sciatica, right side: Secondary | ICD-10-CM | POA: Diagnosis not present

## 2019-02-16 DIAGNOSIS — N3281 Overactive bladder: Secondary | ICD-10-CM

## 2019-02-16 DIAGNOSIS — I10 Essential (primary) hypertension: Secondary | ICD-10-CM

## 2019-02-16 DIAGNOSIS — M109 Gout, unspecified: Secondary | ICD-10-CM

## 2019-02-16 MED ORDER — CYCLOBENZAPRINE HCL 10 MG PO TABS: 10.0000 mg | ORAL_TABLET | Freq: Every day | ORAL | 0 refills | Status: DC

## 2019-02-16 MED ORDER — MELOXICAM 15 MG PO TABS: 15.0000 mg | ORAL_TABLET | Freq: Every day | ORAL | 2 refills | Status: DC

## 2019-02-16 MED ORDER — AMLODIPINE BESYLATE 10 MG PO TABS: 10.0000 mg | ORAL_TABLET | Freq: Every day | ORAL | 3 refills | Status: DC

## 2019-02-16 MED ORDER — BLOOD PRESSURE MONITOR KIT: 1.0000 [IU] | PACK | Freq: Every day | 0 refills | Status: DC

## 2019-02-16 MED ORDER — MIRABEGRON ER 25 MG PO TB24: 25.0000 mg | ORAL_TABLET | Freq: Every day | ORAL | 2 refills | Status: DC

## 2019-02-16 MED FILL — MELOXICAM 15 MG TABLET: 15 | 30 days supply | Qty: 30 | Fill #2

## 2019-02-16 MED FILL — AMLODIPINE BESYLATE 10 MG T: 10 | 30 days supply | Qty: 30 | Fill #3

## 2019-02-16 NOTE — Progress Notes (Signed)
  Patient [facility]  Virtual Visit via Telephone Note  I connected with Daniel Haynes on 02/16/19 at  8:00 AM EDT by telephone and verified that I am speaking with the correct person using two identifiers.   I discussed the limitations, risks, security and privacy concerns of performing an evaluation and management service by telephone and the availability of in person appointments. I also discussed with the patient that there may be a patient responsible charge related to this service. The patient expressed understanding and agreed to proceed.   History of Present Illness: Daniel Haynes  has a past medical history of Arthritis, Gout, Hyperlipidemia, and Hypertension. Patient reports compliance with his medications. He denies side effects. He continues to have "burning pain" in his legs and back at night. Denies chest pain, SOB, dizziness or leg swelling.    Observations/Objective: Patient with regular voice tone, rate and rhythm. Speaking calmly and is in no apparent distress.    Assessment and Plan: 1. Hypertension, unspecified type - amLODipine (NORVASC) 10 MG tablet; Take 1 tablet (10 mg total) by mouth daily.  Dispense: 90 tablet; Refill: 3  2. Overactive bladder - mirabegron ER (MYRBETRIQ) 25 MG TB24 tablet; Take 1 tablet (25 mg total) by mouth daily.  Dispense: 30 tablet; Refill: 2  3. Sciatica of right side - cyclobenzaprine (FLEXERIL) 10 MG tablet; Take 1 tablet (10 mg total) by mouth at bedtime.  Dispense: 30 tablet; Refill: 0 - meloxicam (MOBIC) 15 MG tablet; Take 1 tablet (15 mg total) by mouth daily.  Dispense: 30 tablet; Refill: 2  4. Gout of foot, unspecified cause, unspecified chronicity, unspecified laterality No medication changes warranted at the present time.   Patient has not been taking meloxicam. Refilled to help with back pain.      Follow Up Instructions:  We discussed hand washing, using hand sanitizer when soap and  water are not available, only going out when absolutely necessary, and social distancing. Explained to patient that he is immunocompromised and will need to take precautions during this time.   I discussed the assessment and treatment plan with the patient. The patient was provided an opportunity to ask questions and all were answered. The patient agreed with the plan and demonstrated an understanding of the instructions.   The patient was advised to call back or seek an in-person evaluation if the symptoms worsen or if the condition fails to improve as anticipated.  I provided 10 minutes of non-face-to-face time during this encounter.  Ms. Andr L. Nathaneil Canary, FNP-BC Patient [facility] [address] [place], [place] [address] [phone]

## 2019-02-21 MED FILL — GABAPENTIN 300 MG CAPSULE: 300 | 33 days supply | Qty: 60 | Fill #3

## 2019-03-30 MED FILL — LISINOPRIL 20 MG TABLET: 20 | 30 days supply | Qty: 30 | Fill #5

## 2019-03-30 MED FILL — AMLODIPINE BESYLATE 10 MG T: 10 | 30 days supply | Qty: 30 | Fill #4

## 2019-03-30 MED FILL — HYDROCHLOROTHIAZIDE 25 MG T: 25 | 30 days supply | Qty: 30 | Fill #4

## 2019-03-30 MED FILL — CYCLOBENZAPRINE 10 MG TAB: 10 | 30 days supply | Qty: 30 | Fill #0

## 2019-03-30 MED FILL — MELOXICAM 15 MG TABLET: 15 | 30 days supply | Qty: 30 | Fill #0

## 2019-05-08 ENCOUNTER — Other Ambulatory Visit: Payer: Self-pay | Admitting: Family Medicine

## 2019-05-08 DIAGNOSIS — M5431 Sciatica, right side: Secondary | ICD-10-CM

## 2019-05-08 MED FILL — AMLODIPINE BESYLATE 10 MG T: 10 | 30 days supply | Qty: 30 | Fill #5

## 2019-05-08 MED FILL — LISINOPRIL 20 MG TABLET: 20 | 30 days supply | Qty: 30 | Fill #0

## 2019-05-08 MED FILL — HYDROCHLOROTHIAZIDE 25 MG T: 25 | 30 days supply | Qty: 30 | Fill #5

## 2019-05-08 MED FILL — MELOXICAM 15 MG TABLET: 15 | 30 days supply | Qty: 30 | Fill #1

## 2019-05-18 ENCOUNTER — Ambulatory Visit (INDEPENDENT_AMBULATORY_CARE_PROVIDER_SITE_OTHER): Payer: Medicare Other | Admitting: Family Medicine

## 2019-05-18 ENCOUNTER — Encounter: Payer: Self-pay | Admitting: Family Medicine

## 2019-05-18 ENCOUNTER — Other Ambulatory Visit: Payer: Self-pay

## 2019-05-18 VITALS — BP 148/78 | HR 76 | Temp 98.2°F | Resp 16 | Ht 65.0 in | Wt 174.0 lb

## 2019-05-18 DIAGNOSIS — N3281 Overactive bladder: Secondary | ICD-10-CM

## 2019-05-18 DIAGNOSIS — I10 Essential (primary) hypertension: Secondary | ICD-10-CM

## 2019-05-18 DIAGNOSIS — M5431 Sciatica, right side: Secondary | ICD-10-CM

## 2019-05-18 LAB — POCT URINALYSIS DIPSTICK
Bilirubin, UA: NEGATIVE
Blood, UA: NEGATIVE
Glucose, UA: NEGATIVE
Ketones, UA: NEGATIVE
Leukocytes, UA: NEGATIVE
Nitrite, UA: NEGATIVE
Protein, UA: NEGATIVE
Spec Grav, UA: 1.015 (ref 1.010–1.025)
Urobilinogen, UA: 0.2 E.U./dL
pH, UA: 6 (ref 5.0–8.0)

## 2019-05-18 MED ORDER — GABAPENTIN 300 MG PO CAPS: 600.0000 mg | ORAL_CAPSULE | Freq: Every day | ORAL | 3 refills | Status: DC

## 2019-05-18 MED ORDER — CYCLOBENZAPRINE HCL 10 MG PO TABS: 10.0000 mg | ORAL_TABLET | Freq: Every day | ORAL | 2 refills | Status: DC

## 2019-05-18 MED FILL — CYCLOBENZAPRINE 10 MG TAB: 10 | 30 days supply | Qty: 30 | Fill #0

## 2019-05-18 MED FILL — GABAPENTIN 300 MG CAPSULE: 300 | 34 days supply | Qty: 60 | Fill #0

## 2019-05-18 NOTE — Patient Instructions (Signed)
Sciatica ° °Sciatica is pain, weakness, tingling, or loss of feeling (numbness) along the sciatic nerve. The sciatic nerve starts in the lower back and goes down the back of each leg. Sciatica usually goes away on its own or with treatment. Sometimes, sciatica may come back (recur). °What are the causes? °This condition happens when the sciatic nerve is pinched or has pressure put on it. This may be the result of: °· A disk in between the bones of the spine bulging out too far (herniated disk). °· Changes in the spinal disks that occur with aging. °· A condition that affects a muscle in the butt. °· Extra bone growth near the sciatic nerve. °· A break (fracture) of the area between your hip bones (pelvis). °· Pregnancy. °· Tumor. This is rare. °What increases the risk? °You are more likely to develop this condition if you: °· Play sports that put pressure or stress on the spine. °· Have poor strength and ease of movement (flexibility). °· Have had a back injury in the past. °· Have had back surgery. °· Sit for long periods of time. °· Do activities that involve bending or lifting over and over again. °· Are very overweight (obese). °What are the signs or symptoms? °Symptoms can vary from mild to very bad. They may include: °· Any of these problems in the lower back, leg, hip, or butt: °? Mild tingling, loss of feeling, or dull aches. °? Burning sensations. °? Sharp pains. °· Loss of feeling in the back of the calf or the sole of the foot. °· Leg weakness. °· Very bad back pain that makes it hard to move. °These symptoms may get worse when you cough, sneeze, or laugh. They may also get worse when you sit or stand for long periods of time. °How is this treated? °This condition often gets better without any treatment. However, treatment may include: °· Changing or cutting back on physical activity when you have pain. °· Doing exercises and stretching. °· Putting ice or heat on the affected area. °· Medicines that  help: °? To relieve pain and swelling. °? To relax your muscles. °· Shots (injections) of medicines that help to relieve pain, irritation, and swelling. °· Surgery. °Follow these instructions at home: °Medicines °· Take over-the-counter and prescription medicines only as told by your doctor. °· Ask your doctor if the medicine prescribed to you: °? Requires you to avoid driving or using heavy machinery. °? Can cause trouble pooping (constipation). You may need to take these steps to prevent or treat trouble pooping: °§ Drink enough fluids to keep your pee (urine) pale yellow. °§ Take over-the-counter or prescription medicines. °§ Eat foods that are high in fiber. These include beans, whole grains, and fresh fruits and vegetables. °§ Limit foods that are high in fat and sugar. These include fried or sweet foods. °Managing pain ° °  ° °· If told, put ice on the affected area. °? Put ice in a plastic bag. °? Place a towel between your skin and the bag. °? Leave the ice on for 20 minutes, 2-3 times a day. °· If told, put heat on the affected area. Use the heat source that your doctor tells you to use, such as a moist heat pack or a heating pad. °? Place a towel between your skin and the heat source. °? Leave the heat on for 20-30 minutes. °? Remove the heat if your skin turns bright red. This is very important if you are   unable to feel pain, heat, or cold. You may have a greater risk of getting burned. °Activity ° °· Return to your normal activities as told by your doctor. Ask your doctor what activities are safe for you. °· Avoid activities that make your symptoms worse. °· Take short rests during the day. °? When you rest for a long time, do some physical activity or stretching between periods of rest. °? Avoid sitting for a long time without moving. Get up and move around at least one time each hour. °· Exercise and stretch regularly, as told by your doctor. °· Do not lift anything that is heavier than 10 lb (4.5 kg)  while you have symptoms of sciatica. °? Avoid lifting heavy things even when you do not have symptoms. °? Avoid lifting heavy things over and over. °· When you lift objects, always lift in a way that is safe for your body. To do this, you should: °? Bend your knees. °? Keep the object close to your body. °? Avoid twisting. °General instructions °· Stay at a healthy weight. °· Wear comfortable shoes that support your feet. Avoid wearing high heels. °· Avoid sleeping on a mattress that is too soft or too hard. You might have less pain if you sleep on a mattress that is firm enough to support your back. °· Keep all follow-up visits as told by your doctor. This is important. °Contact a doctor if: °· You have pain that: °? Wakes you up when you are sleeping. °? Gets worse when you lie down. °? Is worse than the pain you have had in the past. °? Lasts longer than 4 weeks. °· You lose weight without trying. °Get help right away if: °· You cannot control when you pee (urinate) or poop (have a bowel movement). °· You have weakness in any of these areas and it gets worse: °? Lower back. °? The area between your hip bones. °? Butt. °? Legs. °· You have redness or swelling of your back. °· You have a burning feeling when you pee. °Summary °· Sciatica is pain, weakness, tingling, or loss of feeling (numbness) along the sciatic nerve. °· This condition happens when the sciatic nerve is pinched or has pressure put on it. °· Sciatica can cause pain, tingling, or loss of feeling (numbness) in the lower back, legs, hips, and butt. °· Treatment often includes rest, exercise, medicines, and putting ice or heat on the affected area. °This information is not intended to replace advice given to you by your health care provider. Make sure you discuss any questions you have with your health care provider. °Document Released: 07/14/2008 Document Revised: 10/24/2018 Document Reviewed: 10/24/2018 °Elsevier Patient Education © 2020 [employer] ° °

## 2019-05-18 NOTE — Progress Notes (Signed)
Patient [facility]   Progress Note: General Provider: Lanae Boast, FNP  SUBJECTIVE:   Daniel Haynes is a 68 y.o. male who  has a past medical history of Arthritis, Gout, Hyperlipidemia, and Hypertension.. Patient presents today for Hypertension, Sciatica (interrupt sleep ), and Urinary Frequency Hypertension This is a chronic problem. The current episode started more than 1 year ago. The problem is controlled. Agents associated with hypertension include NSAIDs. Risk factors for coronary artery disease include sedentary lifestyle and male gender.  Urinary Frequency  Associated symptoms include frequency. Pertinent negatives include no discharge, flank pain or hesitancy.  Back Pain This is a chronic problem. The current episode started more than 1 month ago. The problem occurs constantly. The problem is unchanged. The pain is present in the lumbar spine. The quality of the pain is described as burning. The pain radiates to the right thigh. The pain is moderate. The pain is worse during the night. The symptoms are aggravated by lying down and position. Stiffness is present in the morning. Pertinent negatives include no bladder incontinence, bowel incontinence, perianal numbness or weight loss. Risk factors include poor posture, lack of exercise and sedentary lifestyle. He has tried home exercises, muscle relaxant and NSAIDs for the symptoms. The treatment provided mild relief.   Patient has been referred to physical therapy for further evaluation of his sciatica. He has not been as of today. He reports that he is out of gabapentin and flexeril.   Review of Systems  Constitutional: Negative for weight loss.  Gastrointestinal: Negative for bowel incontinence.  Genitourinary: Positive for frequency. Negative for bladder incontinence, flank pain and hesitancy.  Musculoskeletal: Positive for back pain.     OBJECTIVE: BP (!) 148/78 (BP Location: Left Arm,  Patient Position: Sitting, Cuff Size: Large)   Pulse 76   Temp 98.2 F (36.8 C) (Oral)   Resp 16   Ht 5\' 5"  (1.651 m)   Wt 174 lb (78.9 kg)   SpO2 100%   BMI 28.96 kg/m   Wt Readings from Last 3 Encounters:  05/18/19 174 lb (78.9 kg)  12/07/18 166 lb (75.3 kg)  11/17/18 169 lb (76.7 kg)     Physical Exam Vitals signs and nursing note reviewed.  Constitutional:      General: He is not in acute distress.    Appearance: Normal appearance.  HENT:     Head: Normocephalic and atraumatic.  Eyes:     Extraocular Movements: Extraocular movements intact.     Conjunctiva/sclera: Conjunctivae normal.     Pupils: Pupils are equal, round, and reactive to light.  Cardiovascular:     Rate and Rhythm: Normal rate and regular rhythm.     Heart sounds: No murmur.  Pulmonary:     Effort: Pulmonary effort is normal.     Breath sounds: Normal breath sounds.  Musculoskeletal: Normal range of motion.  Skin:    General: Skin is warm and dry.  Neurological:     Mental Status: He is alert and oriented to person, place, and time.  Psychiatric:        Mood and Affect: Mood normal.        Behavior: Behavior normal.        Thought Content: Thought content normal.        Judgment: Judgment normal.     ASSESSMENT/PLAN:  1. Accelerated hypertension - Urinalysis Dipstick - Comprehensive metabolic panel - Lipid Panel  2. Overactive bladder - Ambulatory referral to Urology  3. Sciatica of right side - Ambulatory referral to Physical Therapy - gabapentin (NEURONTIN) 300 MG capsule; Take 2 capsules (600 mg total) by mouth at bedtime. 1 cap x 1 week then increase to 2 caps po qhs  Dispense: 60 capsule; Refill: 3 - cyclobenzaprine (FLEXERIL) 10 MG tablet; Take 1 tablet (10 mg total) by mouth at bedtime.  Dispense: 30 tablet; Refill: 2    Return in about 3 months (around 08/18/2019) for htn.    The patient was given clear instructions to go to ER or return to medical center if symptoms do not  improve, worsen or new problems develop. The patient verbalized understanding and agreed with plan of care.   Ms. [name], FNP-BC Patient [facility] [address] [place], [place] [address] [phone]

## 2019-06-20 MED FILL — MELOXICAM 15 MG TABLET: 15 | 30 days supply | Qty: 30 | Fill #2

## 2019-06-20 MED FILL — AMLODIPINE BESYLATE 10 MG T: 10 | 30 days supply | Qty: 30 | Fill #6

## 2019-06-20 MED FILL — HYDROCHLOROTHIAZIDE 25 MG T: 25 | 30 days supply | Qty: 30 | Fill #6

## 2019-06-20 MED FILL — LISINOPRIL 20 MG TABLET: 20 | 30 days supply | Qty: 30 | Fill #1

## 2019-06-28 ENCOUNTER — Encounter (HOSPITAL_COMMUNITY): Payer: Self-pay

## 2019-06-28 ENCOUNTER — Encounter (HOSPITAL_COMMUNITY): Payer: Self-pay | Admitting: *Deleted

## 2019-08-11 ENCOUNTER — Ambulatory Visit: Payer: Medicare Other | Admitting: Family Medicine

## 2019-08-16 ENCOUNTER — Other Ambulatory Visit: Payer: Self-pay | Admitting: Family Medicine

## 2019-08-16 DIAGNOSIS — M5431 Sciatica, right side: Secondary | ICD-10-CM

## 2019-08-16 MED FILL — LISINOPRIL 20 MG TABLET: 20 | 30 days supply | Qty: 30 | Fill #2

## 2019-08-16 MED FILL — HYDROCHLOROTHIAZIDE 25 MG T: 25 | 30 days supply | Qty: 30 | Fill #7

## 2019-08-16 MED FILL — AMLODIPINE BESYLATE 10 MG T: 10 | 30 days supply | Qty: 30 | Fill #7

## 2019-08-16 MED FILL — MELOXICAM 15 MG TABLET: 15 | 30 days supply | Qty: 30 | Fill #0

## 2019-08-20 DIAGNOSIS — G4700 Insomnia, unspecified: Secondary | ICD-10-CM

## 2019-08-20 HISTORY — DX: Insomnia, unspecified: G47.00

## 2019-08-25 ENCOUNTER — Ambulatory Visit (INDEPENDENT_AMBULATORY_CARE_PROVIDER_SITE_OTHER): Payer: Medicare Other | Admitting: Family Medicine

## 2019-08-25 ENCOUNTER — Other Ambulatory Visit: Payer: Self-pay

## 2019-08-25 ENCOUNTER — Encounter: Payer: Self-pay | Admitting: Family Medicine

## 2019-08-25 VITALS — BP 134/72 | HR 86 | Temp 98.0°F | Ht 65.0 in | Wt 173.4 lb

## 2019-08-25 DIAGNOSIS — E538 Deficiency of other specified B group vitamins: Secondary | ICD-10-CM | POA: Diagnosis not present

## 2019-08-25 DIAGNOSIS — Z09 Encounter for follow-up examination after completed treatment for conditions other than malignant neoplasm: Secondary | ICD-10-CM | POA: Diagnosis not present

## 2019-08-25 DIAGNOSIS — R739 Hyperglycemia, unspecified: Secondary | ICD-10-CM | POA: Diagnosis not present

## 2019-08-25 DIAGNOSIS — Z0000 Encounter for general adult medical examination without abnormal findings: Secondary | ICD-10-CM | POA: Diagnosis not present

## 2019-08-25 DIAGNOSIS — R350 Frequency of micturition: Secondary | ICD-10-CM | POA: Diagnosis not present

## 2019-08-25 DIAGNOSIS — I10 Essential (primary) hypertension: Secondary | ICD-10-CM

## 2019-08-25 DIAGNOSIS — E559 Vitamin D deficiency, unspecified: Secondary | ICD-10-CM | POA: Diagnosis not present

## 2019-08-25 LAB — POCT URINALYSIS DIPSTICK
Bilirubin, UA: NEGATIVE
Blood, UA: NEGATIVE
Glucose, UA: NEGATIVE
Ketones, UA: NEGATIVE
Nitrite, UA: NEGATIVE
Protein, UA: NEGATIVE
Spec Grav, UA: >=1.03 — AB (ref 1.010–1.025)
Urobilinogen, UA: 0.2 E.U./dL
pH, UA: 6 (ref 5.0–8.0)

## 2019-08-25 LAB — POCT GLYCOSYLATED HEMOGLOBIN (HGB A1C): Hemoglobin A1C: 5.1 % (ref 4.0–5.6)

## 2019-08-25 LAB — GLUCOSE, POCT (MANUAL RESULT ENTRY): POC Glucose: 122 mg/dl — AB (ref 70–99)

## 2019-08-25 NOTE — Progress Notes (Signed)
Patient [facility]  Established Patient Office Visit  Subjective:  Patient ID: Daniel Haynes, male    DOB: 15-Apr-1951  Age: 69 y.o. MRN: [id number]  CC:  Chief Complaint  Patient presents with  . Follow-up    Former Venora Maples pt-mediction refills   HPI Daniel Haynes is a 68 year old male who presents for Follow Up today.   Past Medical History:  Diagnosis Date  . Arthritis    hands fingers wrists   . Gout   . Hyperlipidemia   . Hypertension    Current Status: This will be Mr. Revolorio initial office visit with me. He was previously seeing Lanae Boast, NP for his PCP needs. Since his last office visit, he is doing well with no complaints. He denies visual changes, chest pain, cough, shortness of breath, heart palpitations, and falls. He has occasional headaches and dizziness with position changes. Denies severe headaches, confusion, seizures, double vision, and blurred vision, nausea and vomiting. He denies fevers, chills, fatigue, recent infections, weight loss, and night sweats. No reports of GI problems such as diarrhea, and constipation. He has no reports of blood in stools, dysuria and hematuria. No depression or anxiety reported today. He denies pain today.   Past Surgical History:  Procedure Laterality Date  . CERVICAL DISCECTOMY  2004   [facility]    Family History  Problem Relation Age of Onset  . Cancer Mother   . Cancer Father   . Colon cancer Neg Hx   . Colon polyps Neg Hx   . Esophageal cancer Neg Hx   . Rectal cancer Neg Hx   . Stomach cancer Neg Hx     Social History   Socioeconomic History  . Marital status: Single    Spouse name: Not on file  . Number of children: Not on file  . Years of education: Not on file  . Highest education level: Not on file  Occupational History  . Not on file  Social Needs  . Financial resource strain: Not on file  . Food insecurity    Worry: Not on file    Inability: Not on file  .  Transportation needs    Medical: Not on file    Non-medical: Not on file  Tobacco Use  . Smoking status: Former Research scientist (life sciences)  . Smokeless tobacco: Never Used  Substance and Sexual Activity  . Alcohol use: Yes    Alcohol/week: 21.0 standard drinks    Types: 21 Shots of liquor per week    Comment: 3 a day   . Drug use: Yes    Types: Marijuana    Comment: occ use   . Sexual activity: Not on file  Lifestyle  . Physical activity    Days per week: Not on file    Minutes per session: Not on file  . Stress: Not on file  Relationships  . Social Herbalist on phone: Not on file    Gets together: Not on file    Attends religious service: Not on file    Active member of club or organization: Not on file    Attends meetings of clubs or organizations: Not on file    Relationship status: Not on file  . Intimate partner violence    Fear of current or ex partner: Not on file    Emotionally abused: Not on file    Physically abused: Not on file    Forced sexual activity: Not on  file  Other Topics Concern  . Not on file  Social History Narrative  . Not on file    Outpatient Medications Prior to Visit  Medication Sig Dispense Refill  . amLODipine (NORVASC) 10 MG tablet Take 1 tablet (10 mg total) by mouth daily. 90 tablet 3  . hydrochlorothiazide (HYDRODIURIL) 25 MG tablet Take 1 tablet (25 mg total) by mouth daily. 90 tablet 3  . lisinopril (PRINIVIL,ZESTRIL) 20 MG tablet Take 1 tablet (20 mg total) by mouth daily. 90 tablet 3  . meloxicam (MOBIC) 15 MG tablet TAKE 1 TABLET BY MOUTH DAILY. 30 tablet 2  . mirabegron ER (MYRBETRIQ) 25 MG TB24 tablet Take 1 tablet (25 mg total) by mouth daily. 30 tablet 2  . Blood Pressure Monitor KIT 1 Units by Does not apply route daily. 1 each 0  . colchicine 0.6 MG tablet Take 2 tablets once, then 0ne tablet one hour later. (Patient not taking: Reported on 09/20/2018) 3 tablet 0  . cyclobenzaprine (FLEXERIL) 10 MG tablet Take 1 tablet (10 mg total) by  mouth at bedtime. 30 tablet 2  . gabapentin (NEURONTIN) 300 MG capsule Take 2 capsules (600 mg total) by mouth at bedtime. 1 cap x 1 week then increase to 2 caps po qhs 60 capsule 3  . ibuprofen (ADVIL,MOTRIN) 800 MG tablet Take 1 tablet (800 mg total) by mouth every 8 (eight) hours as needed. (Patient not taking: Reported on 11/03/2018) 30 tablet 0  . pravastatin (PRAVACHOL) 40 MG tablet Take 1 tablet (40 mg total) by mouth daily. 90 tablet 1   No facility-administered medications prior to visit.     No Known Allergies  ROS Review of Systems  Constitutional: Negative.   HENT: Negative.   Eyes: Negative.   Respiratory: Negative.   Cardiovascular: Negative.   Gastrointestinal: Negative.   Endocrine: Negative.   Genitourinary: Positive for urgency.  Musculoskeletal: Negative.   Skin: Negative.   Allergic/Immunologic: Negative.   Neurological: Positive for dizziness and headaches.  Hematological: Negative.   Psychiatric/Behavioral: Negative.       Objective:    Physical Exam  Constitutional: He is oriented to person, place, and time. He appears well-developed and well-nourished.  HENT:  Head: Normocephalic and atraumatic.  Eyes: Conjunctivae are normal.  Neck: Normal range of motion. Neck supple.  Cardiovascular: Normal rate, regular rhythm, normal heart sounds and intact distal pulses.  Pulmonary/Chest: Effort normal and breath sounds normal.  Abdominal: Soft. Bowel sounds are normal.  Musculoskeletal: Normal range of motion.  Neurological: He is alert and oriented to person, place, and time. He has normal reflexes.  Skin: Skin is warm and dry.  Psychiatric: He has a normal mood and affect. His behavior is normal. Judgment and thought content normal.  Nursing note and vitals reviewed.   BP 134/72   Pulse 86   Temp 98 F (36.7 C) (Oral)   Ht _0  (1.651 m)   Wt 173 lb 6.4 oz (78.7 kg)   SpO2 100%   BMI 28.86 kg/m  Wt Readings from Last 3 Encounters:  08/25/19 173  lb 6.4 oz (78.7 kg)  05/18/19 174 lb (78.9 kg)  12/07/18 166 lb (75.3 kg)     Health Maintenance Due  Topic Date Due  . INFLUENZA VACCINE  05/20/2019    There are no preventive care reminders to display for this patient.  No results found for: TSH Lab Results  Component Value Date   WBC 5.2 06/29/2017   HGB 13.7 06/29/2017  HCT 40.3 06/29/2017   MCV 87.4 06/29/2017   PLT 284 06/29/2017   Lab Results  Component Value Date   NA 139 06/29/2017   K 4.2 06/29/2017   CO2 23 06/29/2017   GLUCOSE 94 06/29/2017   BUN 17 06/29/2017   CREATININE 1.49 (H) 06/29/2017   BILITOT 0.6 06/29/2017   ALKPHOS 81 12/28/2016   AST 19 06/29/2017   ALT 17 06/29/2017   PROT 7.1 06/29/2017   ALBUMIN 4.2 12/28/2016   CALCIUM 9.2 06/29/2017   ANIONGAP 13 12/30/2015   Lab Results  Component Value Date   CHOL 250 (H) 12/28/2016   Lab Results  Component Value Date   HDL 49 12/28/2016   Lab Results  Component Value Date   LDLCALC 152 (H) 12/28/2016   Lab Results  Component Value Date   TRIG 246 (H) 12/28/2016   Lab Results  Component Value Date   CHOLHDL 5.1 (H) 12/28/2016   Lab Results  Component Value Date   HGBA1C 5.1 08/25/2019      Assessment & Plan:   1. Essential hypertension The current medical regimen is effective; blood pressure is stable at 134/72 today; continue present plan and medications as prescribed. He will continue to take medications as prescribed, to decrease high sodium intake, excessive alcohol intake, increase potassium intake, smoking cessation, and increase physical activity of at least 30 minutes of cardio activity daily. He will continue to follow Heart Healthy or DASH diet. - Urinalysis Dipstick - TSH  2. Hyperglycemia Blood glucose level is stable at 5.1 today. - Glucose (CBG) - HgB A1c  3. Urinary frequency - PSA  4. Vitamin D deficiency - Vitamin D, 25-hydroxy  5. Vitamin B 12 deficiency - Vitamin B12  6. Healthcare maintenance   7. Follow up He will follow up in 3 months.   No orders of the defined types were placed in this encounter.   Orders Placed This Encounter  Procedures  . PSA  . TSH  . Vitamin B12  . Vitamin D, 25-hydroxy  . Urinalysis Dipstick  . Glucose (CBG)  . HgB A1c    Referral Orders  No referral(s) requested today    Kathe Becton,  MSN, FNP-BC [place], [place], [place] [address] [phone]- fax   Problem List Items Addressed This Visit      Other   Urinary frequency   Relevant Orders   PSA    Other Visit Diagnoses    Essential hypertension    -  Primary   Relevant Orders   Urinalysis Dipstick (Completed)   TSH   Hyperglycemia       Relevant Orders   Glucose (CBG) (Completed)   HgB A1c (Completed)   Vitamin D deficiency       Relevant Orders   Vitamin D, 25-hydroxy   Vitamin B 12 deficiency       Relevant Orders   Vitamin B12   Healthcare maintenance       Follow up          No orders of the defined types were placed in this encounter.   Follow-up: No follow-ups on file.    Azzie Glatter, FNP

## 2019-08-26 LAB — VITAMIN D 25 HYDROXY (VIT D DEFICIENCY, FRACTURES): Vit D, 25-Hydroxy: 23.4 ng/mL — ABNORMAL LOW (ref 30.0–100.0)

## 2019-08-26 LAB — PSA: Prostate Specific Ag, Serum: 0.4 ng/mL (ref 0.0–4.0)

## 2019-08-26 LAB — TSH: TSH: 0.744 u[IU]/mL (ref 0.450–4.500)

## 2019-08-26 LAB — VITAMIN B12: Vitamin B-12: 405 pg/mL (ref 232–1245)

## 2019-08-29 ENCOUNTER — Telehealth: Payer: Self-pay

## 2019-08-29 NOTE — Telephone Encounter (Signed)
[name] has been informed about his lab results and need for Vitamin D.    He wants to know what he can do about his lack of sleep due to his burning pain. The burning pain is in his hips and down hamstrings. Especially at night which keeps him up.   Please advise.

## 2019-08-31 ENCOUNTER — Other Ambulatory Visit: Payer: Self-pay

## 2019-08-31 ENCOUNTER — Encounter: Payer: Self-pay | Admitting: Family Medicine

## 2019-08-31 ENCOUNTER — Other Ambulatory Visit: Payer: Self-pay | Admitting: Family Medicine

## 2019-08-31 DIAGNOSIS — M5431 Sciatica, right side: Secondary | ICD-10-CM

## 2019-08-31 DIAGNOSIS — G4701 Insomnia due to medical condition: Secondary | ICD-10-CM

## 2019-08-31 MED ORDER — GABAPENTIN 300 MG PO CAPS: 600.0000 mg | ORAL_CAPSULE | Freq: Every day | ORAL | 1 refills | Status: DC

## 2019-08-31 MED ORDER — TRAZODONE HCL 50 MG PO TABS: 50.0000 mg | ORAL_TABLET | Freq: Every evening | ORAL | 3 refills | Status: DC | PRN

## 2019-08-31 NOTE — Telephone Encounter (Signed)
Patient informed. 

## 2019-10-05 MED FILL — AMLODIPINE BESYLATE 10 MG T: 10 | 30 days supply | Qty: 30 | Fill #8

## 2019-10-05 MED FILL — HYDROCHLOROTHIAZIDE 25 MG T: 25 | 30 days supply | Qty: 30 | Fill #8

## 2019-10-05 MED FILL — MELOXICAM 15 MG TABLET: 15 | 30 days supply | Qty: 30 | Fill #1

## 2019-10-05 MED FILL — LISINOPRIL 20 MG TABLET: 20 | 30 days supply | Qty: 30 | Fill #3

## 2019-11-15 ENCOUNTER — Other Ambulatory Visit: Payer: Self-pay | Admitting: Family Medicine

## 2019-11-15 DIAGNOSIS — I10 Essential (primary) hypertension: Secondary | ICD-10-CM

## 2019-11-15 MED FILL — MELOXICAM 15 MG TABLET: 15 | 30 days supply | Qty: 30 | Fill #2

## 2019-11-15 MED FILL — AMLODIPINE BESYLATE 10 MG T: 10 | 30 days supply | Qty: 30 | Fill #0

## 2019-11-15 MED FILL — LISINOPRIL 20 MG TABLET: 20 | 30 days supply | Qty: 30 | Fill #0

## 2019-11-15 MED FILL — HYDROCHLOROTHIAZIDE 25 MG T: 25 | 30 days supply | Qty: 30 | Fill #0

## 2019-11-27 ENCOUNTER — Ambulatory Visit: Payer: Medicare Other | Admitting: Family Medicine

## 2020-01-11 MED FILL — AMLODIPINE BESYLATE 10 MG T: 10 | 30 days supply | Qty: 30 | Fill #1

## 2020-01-11 MED FILL — HYDROCHLOROTHIAZIDE 25 MG T: 25 | 30 days supply | Qty: 30 | Fill #1

## 2020-02-19 ENCOUNTER — Other Ambulatory Visit: Payer: Self-pay | Admitting: Family Medicine

## 2020-02-19 DIAGNOSIS — I10 Essential (primary) hypertension: Secondary | ICD-10-CM

## 2020-02-19 MED FILL — LISINOPRIL 20 MG TABLET: 20 | 30 days supply | Qty: 30 | Fill #1

## 2020-02-19 MED FILL — HYDROCHLOROTHIAZIDE 25 MG T: 25 | 30 days supply | Qty: 30 | Fill #2

## 2020-03-04 ENCOUNTER — Other Ambulatory Visit: Payer: Self-pay | Admitting: Nurse Practitioner

## 2020-03-04 ENCOUNTER — Encounter: Payer: Self-pay | Admitting: Nurse Practitioner

## 2020-03-04 ENCOUNTER — Other Ambulatory Visit: Payer: Self-pay

## 2020-03-04 ENCOUNTER — Ambulatory Visit (INDEPENDENT_AMBULATORY_CARE_PROVIDER_SITE_OTHER): Payer: Medicare Other | Admitting: Nurse Practitioner

## 2020-03-04 VITALS — BP 144/82 | HR 61 | Ht 65.0 in | Wt 173.0 lb

## 2020-03-04 DIAGNOSIS — G4701 Insomnia due to medical condition: Secondary | ICD-10-CM | POA: Diagnosis not present

## 2020-03-04 DIAGNOSIS — Z0000 Encounter for general adult medical examination without abnormal findings: Secondary | ICD-10-CM

## 2020-03-04 DIAGNOSIS — N3281 Overactive bladder: Secondary | ICD-10-CM | POA: Diagnosis not present

## 2020-03-04 DIAGNOSIS — M5431 Sciatica, right side: Secondary | ICD-10-CM

## 2020-03-04 DIAGNOSIS — E785 Hyperlipidemia, unspecified: Secondary | ICD-10-CM | POA: Diagnosis not present

## 2020-03-04 DIAGNOSIS — M109 Gout, unspecified: Secondary | ICD-10-CM | POA: Diagnosis not present

## 2020-03-04 DIAGNOSIS — R82998 Other abnormal findings in urine: Secondary | ICD-10-CM | POA: Diagnosis not present

## 2020-03-04 DIAGNOSIS — I10 Essential (primary) hypertension: Secondary | ICD-10-CM | POA: Diagnosis not present

## 2020-03-04 LAB — POCT URINALYSIS DIPSTICK
Bilirubin, UA: NEGATIVE
Blood, UA: NEGATIVE
Glucose, UA: NEGATIVE
Ketones, UA: NEGATIVE
Nitrite, UA: NEGATIVE
Protein, UA: NEGATIVE
Spec Grav, UA: 1.025 (ref 1.010–1.025)
Urobilinogen, UA: 0.2 E.U./dL
pH, UA: 6 (ref 5.0–8.0)

## 2020-03-04 MED ORDER — GABAPENTIN 300 MG PO CAPS: 300.0000 mg | ORAL_CAPSULE | Freq: Three times a day (TID) | ORAL | 2 refills | Status: DC

## 2020-03-04 MED ORDER — PRAVASTATIN SODIUM 40 MG PO TABS: 40.0000 mg | ORAL_TABLET | Freq: Every day | ORAL | 1 refills | Status: DC

## 2020-03-04 MED ORDER — AMLODIPINE BESYLATE 10 MG PO TABS: 10.0000 mg | ORAL_TABLET | Freq: Every day | ORAL | 3 refills | Status: DC

## 2020-03-04 MED ORDER — TRAZODONE HCL 50 MG PO TABS: 50.0000 mg | ORAL_TABLET | Freq: Every evening | ORAL | 3 refills | Status: DC | PRN

## 2020-03-04 MED ORDER — MIRABEGRON ER 25 MG PO TB24: 25.0000 mg | ORAL_TABLET | Freq: Every day | ORAL | 3 refills | Status: DC

## 2020-03-04 MED ORDER — HYDROCHLOROTHIAZIDE 25 MG PO TABS: 25.0000 mg | ORAL_TABLET | Freq: Every day | ORAL | 3 refills | Status: DC

## 2020-03-04 MED ORDER — LISINOPRIL 20 MG PO TABS: 20.0000 mg | ORAL_TABLET | Freq: Every day | ORAL | 3 refills | Status: DC

## 2020-03-04 MED ORDER — COLCHICINE 0.6 MG PO TABS: ORAL_TABLET | ORAL | 0 refills | Status: DC

## 2020-03-04 MED FILL — MYRBETRIQ ER 25 MG TABLET: 25 | 30 days supply | Qty: 30 | Fill #0

## 2020-03-04 MED FILL — GABAPENTIN 300 MG CAPSULE: 300 | 30 days supply | Qty: 90 | Fill #0

## 2020-03-04 MED FILL — COLCHICINE 0.6 MG TABS: 0.6 | 1 days supply | Qty: 3 | Fill #0

## 2020-03-04 MED FILL — traZODone HCL 50 MG TABS: 50 | 30 days supply | Qty: 30 | Fill #0

## 2020-03-04 MED FILL — AMLODIPINE BESYLATE 10 MG T: 10 | 30 days supply | Qty: 30 | Fill #0

## 2020-03-04 MED FILL — PRAVASTATIN NA 40 MG TAB: 40 | 30 days supply | Qty: 30 | Fill #0

## 2020-03-04 NOTE — Patient Instructions (Signed)
Information for patients with Gout  Gout defined-Gout occurs when urate crystals accumulate in your joint causing the inflammation and intense pain of gout attack.  Urate crystals can form when you have high levels of uric acid in your blood.  Your body produces uric acid when it breaks down prurines-substances that are found naturally in your body, as well as in certain foods such as organ meats, anchioves, herring, asparagus, and mushrooms.  Normally uric acid dissolves in your blood and passes through your kidneys into your urine.  But sometimes your body either produces too much uric acid or your kidneys excrete too little uric acid.  When this happens, uric acid can build up, forming sharp needle-like urate crystals in a joint or surrounding tissue that cause pain, inflammation and swelling.    Gout is characterized by sudden, severe attacks of pain, redness and tenderness in joints, often the joint at the base of the big toe.  Gout is complex form of arthritis that can affect anyone.  Men are more likely to get gout but women become increasingly more susceptible to gout after menopause.  An acute attack of gout can wake you up in the middle of the night with the sensation that your big toe is on fire.  The affected joint is hot, swollen and so tender that even the weight or the sheet on it may seem intolerable.  If you experience symptoms of an acute gout attack it is important to your doctor as soon as the symptoms start.  Gout that goes untreated can lead to worsening pain and joint damage.  Risk Factors:  You are more likely to develop gout if you have high levels of uric acid in your body.    Factors that increase the uric acid level in your body include:  Lifestyle factors.  Excessive alcohol use-generally more than two drinks a day for men and more than one for women increase the risk of gout.  Medical conditions.  Certain conditions make it more likely that you will develop gout.   These include hypertension, and chronic conditions such as diabetes, high levels of fat and cholesterol in the blood, and narrowing of the arteries.  Certain medications.  The uses of Thiazide diuretics- commonly used to treat hypertension and low dose aspirin can also increase uric acid levels.  Family history of gout.  If other members of your family have had gout, you are more likely to develop the disease.  Age and sex. Gout occurs more often in men than it does in women, primarily because women tend to have lower uric acid levels than men do.  Men are more likely to develop gout earlier usually between the ages of 40-50- whereas women generally develop signs and symptoms after menopause.    Tests and diagnosis:  Tests to help diagnose gout may include:  Blood test.  Your doctor may recommend a blood test to measure the uric acid level in your blood .  Blood tests can be misleading, though.  Some people have high uric acid levels but never experience gout.  And some people have signs and symptoms of gout, but don't have unusual levels of uric acid in their blood.  Joint fluid test.  Your doctor may use a needle to draw fluid from your affected joint.  When examined under the microscope, your joint fluid may reveal urate crystals.  Treatment:  Treatment for gout usually involves medications.  What medications you and your doctor choose will be   based on your current health and other medications you currently take.  Gout medications can be used to treat acute gout attacks and prevent future attacks as well as reduce your risk of complications from gout such as the development of tophi from urate Tationa Stech deposits.  Alternative medicine:   Certain foods have been studied for their potential to lower uric acid levels, including:  Coffee.  Studies have found an association between coffee drinking (regular and decaf) and lower uric acid levels.  The evidence is not enough to encourage  non-coffee drinkers to start, but it may give clues to new ways of treating gout in the future.  Vitamin C.  Supplements containing vitamin C may reduce the levels of uric acid in your blood.  However, vitamin as a treatment for gout. Don't assume that if a little vitamin C is good, than lots is better.  Megadoses of vitamin C may increase your bodies uric acid levels.  Cherries.  Cherries have been associated with lower levels of uric acid in studies, but it isn't clear if they have any effect on gout signs and symptoms.  Eating more cherries and other dar-colored fruits, such as blackberries, blueberries, purple grapes and raspberries, may be a safe way to support your gout treatment.    Lifestyle/Diet Recommendations:   Drink 8 to 16 cups ( about 2 to 4 liters) of fluid each day, with at least half being water.  Avoid alcohol  Eat a moderate amount of protein, preferably from healthy sources, such as low-fat or fat-free dairy, tofu, eggs, and nut butters.  Limit you daily intake of meat, fish, and poultry to 4 to 6 ounces.  Avoid high fat meats and desserts.  Decrease you intake of shellfish, beef, lamb, pork, eggs and cheese.  Choose a good source of vitamin C daily such as citrus fruits, strawberries, broccoli,  brussel sprouts, papaya, and cantaloupe.   Choose a good source of vitamin A every other day such as yellow fruits, or dark green/yellow vegetables.  Avoid drastic weigh reduction or fasting.  If weigh loss is desired lose it over a period of several months.  See "dietary considerations.." chart for specific food recommendations.  Dietary Considerations for people with Gout  Food with negligible purine content (0-15 mg of purine nitrogen per 100 grams food)  May use as desired except on calorie variations  Non fat milk Cocoa Cereals (except in list II) Hard candies  Buttermilk Carbonated drinks Vegetables (except in list II) Sherbet  Coffee Fruits Sugar Honey  Tea  Cottage Cheese Gelatin-jell-o Salt  Fruit juice Breads Angel food Cake   Herbs/spices Jams/Jellies Carnation Instant Breakfast    Foods that do not contain excessive purine content, but must be limited due to fat content  Cream Eggs Oil and Salad Dressing  Half and Half Peanut Butter Chocolate  Whole Milk Cakes Potato Chips  Butter Ice Cream Fried Foods  Cheese Nuts Waffles, pancakes   List II: Food with moderate purine content (50-150 mg of purine nitrogen per 100 grams of food)  Limit total amount each day to 5 oz. cooked Lean meat, other than those on list III   Poultry, other than those on list III Fish, other than those on list III   Seafood, other than those on list III  These foods may be used occasionally  Peas Lentils Bran  Spinach Oatmeal Dried Beans and Peas  Asparagus Wheat Germ Mushrooms   Additional information about meat choices  Choose fish   and poultry, particularly without skin, often.  Select lean, well trimmed cuts of meat.  Avoid all fatty meats, bacon , sausage, fried meats, fried fish, or poultry, luncheon meats, cold cuts, hot dogs, meats canned or frozen in gravy, spareribs and frozen and packaged prepared meats.   List III: Foods with HIGH purine content / Foods to AVOID (150-800 mg of purine nitrogen per 100 grams of food)  Anchovies Herring Meat Broths  Liver Mackerel Meat Extracts  Kidney Scallops Meat Drippings  Sardines Wild Game Mincemeat  Sweetbreads Goose Gravy  Heart Tongue Yeast, baker's and brewers   Commercial soups made with any of the foods listed in List II or List III  In addition avoid all alcoholic beverages 

## 2020-03-04 NOTE — Progress Notes (Signed)
[facility] [facility] [place], [place]  [address] Phone:  [phone]   Fax:  [phone]   Acute Office Visit  Subjective:    Patient ID: Daniel Haynes, male    DOB: 11/27/1950, 69 y.o.   MRN: [id number]  Chief Complaint  Patient presents with  . Back Pain    back pain in to left calf ,burning, going on  1 year, using ice and heat , otc vid c fish oil didnt help     HPI Patient is in today for back pain. He  has a past medical history of Arthritis, Gout, Hyperlipidemia, Hypertension, and Insomnia (08/2019).    Back Pain Patient presents for evaluation of low back problems. Symptoms have been present for several months and include pain in left buttock  (burning in character; 10/10 in severity). Initial inciting event: none. Symptoms are worse at nighttime. Alleviating factors identifiable by the patient are walking. Aggravating factors identifiable by the patient are lying down. Treatments initiated by the patient: none. Previous lower back problems: yes. . Previous work up: none. Previous treatments: Toradol injection and gabapentin. he has been off the gabapentin becasue he has not been seen in the last year due to the pandemeic. He denies any numbness, tingling or weakness in the legs. He has tried stretching and he rides his bike.  He admits that he is very active.  He works full-time.  He has been out of many of his medications because of the lack of follow-up.  He has been taking lisinopril and hydrochlorothiazide.  Past Medical History:  Diagnosis Date  . Arthritis    hands fingers wrists   . Gout   . Hyperlipidemia   . Hypertension   . Insomnia 08/2019    Past Surgical History:  Procedure Laterality Date  . CERVICAL DISCECTOMY  2004   [facility]    Family History  Problem Relation Age of Onset  . Cancer Mother   . Cancer Father   . Colon cancer Neg Hx   . Colon polyps Neg Hx   . Esophageal cancer Neg Hx   . Rectal cancer Neg Hx   . Stomach  cancer Neg Hx     Social History   Socioeconomic History  . Marital status: Single    Spouse name: Not on file  . Number of children: Not on file  . Years of education: Not on file  . Highest education level: Not on file  Occupational History  . Not on file  Tobacco Use  . Smoking status: Former Research scientist (life sciences)  . Smokeless tobacco: Never Used  Substance and Sexual Activity  . Alcohol use: Yes    Alcohol/week: 21.0 standard drinks    Types: 21 Shots of liquor per week    Comment: 3 a day   . Drug use: Yes    Types: Marijuana    Comment: occ use   . Sexual activity: Not on file  Other Topics Concern  . Not on file  Social History Narrative  . Not on file   Social Determinants of Health   Financial Resource Strain:   . Difficulty of Paying Living Expenses:   Food Insecurity:   . Worried About Charity fundraiser in the Last Year:   . Arboriculturist in the Last Year:   Transportation Needs:   . Film/video editor (Medical):   Marland Kitchen Lack of Transportation (Non-Medical):   Physical Activity:   . Days of Exercise  per Week:   . Minutes of Exercise per Session:   Stress:   . Feeling of Stress :   Social Connections:   . Frequency of Communication with Friends and Family:   . Frequency of Social Gatherings with Friends and Family:   . Attends Religious Services:   . Active Member of Clubs or Organizations:   . Attends Archivist Meetings:   Marland Kitchen Marital Status:   Intimate Partner Violence:   . Fear of Current or Ex-Partner:   . Emotionally Abused:   Marland Kitchen Physically Abused:   . Sexually Abused:     Outpatient Medications Prior to Visit  Medication Sig Dispense Refill  . hydrochlorothiazide (HYDRODIURIL) 25 MG tablet TAKE 1 TABLET (25 MG TOTAL) BY MOUTH DAILY. 30 tablet 3  . lisinopril (ZESTRIL) 20 MG tablet TAKE 1 TABLET (20 MG TOTAL) BY MOUTH DAILY. 30 tablet 3  . mirabegron ER (MYRBETRIQ) 25 MG TB24 tablet Take 1 tablet (25 mg total) by mouth daily. 30 tablet 2  .  Blood Pressure Monitor KIT 1 Units by Does not apply route daily. (Patient not taking: Reported on 03/04/2020) 1 each 0  . cyclobenzaprine (FLEXERIL) 10 MG tablet Take 1 tablet (10 mg total) by mouth at bedtime. (Patient not taking: Reported on 03/04/2020) 30 tablet 2  . ibuprofen (ADVIL,MOTRIN) 800 MG tablet Take 1 tablet (800 mg total) by mouth every 8 (eight) hours as needed. (Patient not taking: Reported on 11/03/2018) 30 tablet 0  . meloxicam (MOBIC) 15 MG tablet TAKE 1 TABLET BY MOUTH DAILY. (Patient not taking: Reported on 03/04/2020) 30 tablet 2  . amLODipine (NORVASC) 10 MG tablet Take 1 tablet (10 mg total) by mouth daily. (Patient not taking: Reported on 03/04/2020) 90 tablet 3  . colchicine 0.6 MG tablet Take 2 tablets once, then 0ne tablet one hour later. (Patient not taking: Reported on 09/20/2018) 3 tablet 0  . gabapentin (NEURONTIN) 300 MG capsule Take 2 capsules (600 mg total) by mouth at bedtime. 1 cap x 1 week then increase to 2 caps po qhs (Patient not taking: Reported on 03/04/2020) 60 capsule 1  . pravastatin (PRAVACHOL) 40 MG tablet Take 1 tablet (40 mg total) by mouth daily. (Patient not taking: Reported on 03/04/2020) 90 tablet 1  . traZODone (DESYREL) 50 MG tablet Take 1 tablet (50 mg total) by mouth at bedtime as needed for sleep. (Patient not taking: Reported on 03/04/2020) 30 tablet 3   No facility-administered medications prior to visit.    No Known Allergies  Review of Systems     Objective:    Physical Exam Constitutional:      Appearance: Normal appearance. He is normal weight.  HENT:     Head: Normocephalic.     Nose: Nose normal.     Mouth/Throat:     Mouth: Mucous membranes are moist.  Cardiovascular:     Rate and Rhythm: Normal rate and regular rhythm.     Pulses: Normal pulses.     Heart sounds: Normal heart sounds.     Comments: Trace to 1+ pitting edema bilateral lower extremity Pulmonary:     Effort: Pulmonary effort is normal.     Breath sounds:  Normal breath sounds.  Abdominal:     General: Abdomen is flat. Bowel sounds are normal.     Palpations: Abdomen is soft.  Musculoskeletal:        General: Normal range of motion.     Cervical back: Normal range of motion.  Right lower leg: Edema present.     Left lower leg: Edema present.  Skin:    General: Skin is warm and dry.  Neurological:     Mental Status: He is alert and oriented to person, place, and time.  Psychiatric:        Mood and Affect: Mood normal.        Behavior: Behavior normal.        Thought Content: Thought content normal.        Judgment: Judgment normal.     BP (!) 144/82 (BP Location: Right Arm, Patient Position: Sitting, Cuff Size: Normal)   Pulse 61   Ht '5\' 5"'  (1.651 m)   Wt 173 lb (78.5 kg)   SpO2 97%   BMI 28.79 kg/m  Wt Readings from Last 3 Encounters:  03/04/20 173 lb (78.5 kg)  08/25/19 173 lb 6.4 oz (78.7 kg)  05/18/19 174 lb (78.9 kg)    Health Maintenance Due  Topic Date Due  . COVID-19 Vaccine (1) Never done    There are no preventive care reminders to display for this patient.   Lab Results  Component Value Date   TSH 0.744 08/25/2019   Lab Results  Component Value Date   WBC 5.2 06/29/2017   HGB 13.7 06/29/2017   HCT 40.3 06/29/2017   MCV 87.4 06/29/2017   PLT 284 06/29/2017   Lab Results  Component Value Date   NA 139 06/29/2017   K 4.2 06/29/2017   CO2 23 06/29/2017   GLUCOSE 94 06/29/2017   BUN 17 06/29/2017   CREATININE 1.49 (H) 06/29/2017   BILITOT 0.6 06/29/2017   ALKPHOS 81 12/28/2016   AST 19 06/29/2017   ALT 17 06/29/2017   PROT 7.1 06/29/2017   ALBUMIN 4.2 12/28/2016   CALCIUM 9.2 06/29/2017   ANIONGAP 13 12/30/2015   Lab Results  Component Value Date   CHOL 250 (H) 12/28/2016   Lab Results  Component Value Date   HDL 49 12/28/2016   Lab Results  Component Value Date   LDLCALC 152 (H) 12/28/2016   Lab Results  Component Value Date   TRIG 246 (H) 12/28/2016   Lab Results   Component Value Date   CHOLHDL 5.1 (H) 12/28/2016   Lab Results  Component Value Date   HGBA1C 5.1 08/25/2019       Assessment & Plan:   Problem List Items Addressed This Visit      Unprioritized   Accelerated hypertension   Relevant Medications   hydrochlorothiazide (HYDRODIURIL) 25 MG tablet   lisinopril (ZESTRIL) 20 MG tablet   amLODipine (NORVASC) 10 MG tablet   pravastatin (PRAVACHOL) 40 MG tablet   Gout   Relevant Orders   Sedimentation rate   Uric Acid    Other Visit Diagnoses    Essential hypertension    -  Primary   Relevant Medications   hydrochlorothiazide (HYDRODIURIL) 25 MG tablet   lisinopril (ZESTRIL) 20 MG tablet   amLODipine (NORVASC) 10 MG tablet   pravastatin (PRAVACHOL) 40 MG tablet   Other Relevant Orders   POCT Urinalysis Dipstick (Completed)   Comp. Metabolic Panel (12)   Hypertension, unspecified type       Relevant Medications   hydrochlorothiazide (HYDRODIURIL) 25 MG tablet   lisinopril (ZESTRIL) 20 MG tablet   amLODipine (NORVASC) 10 MG tablet   pravastatin (PRAVACHOL) 40 MG tablet   Overactive bladder       Relevant Medications   mirabegron ER (MYRBETRIQ) 25 MG TB24  tablet   Sciatica of right side       Relevant Medications   gabapentin (NEURONTIN) 300 MG capsule   traZODone (DESYREL) 50 MG tablet   Insomnia due to medical condition       Relevant Medications   traZODone (DESYREL) 50 MG tablet   Healthcare maintenance       Relevant Orders   Magnesium   PSA   Dyslipidemia       Relevant Medications   pravastatin (PRAVACHOL) 40 MG tablet   Other Relevant Orders   Lipid panel   Leukocytes in urine       Relevant Orders   Urine Culture       Meds ordered this encounter  Medications  . hydrochlorothiazide (HYDRODIURIL) 25 MG tablet    Sig: Take 1 tablet (25 mg total) by mouth daily.    Dispense:  30 tablet    Refill:  3    Order Specific Question:   Supervising Provider    Answer:   Tresa Garter [id number]  .  lisinopril (ZESTRIL) 20 MG tablet    Sig: Take 1 tablet (20 mg total) by mouth daily.    Dispense:  30 tablet    Refill:  3    Order Specific Question:   Supervising Provider    Answer:   Tresa Garter [id number]  . mirabegron ER (MYRBETRIQ) 25 MG TB24 tablet    Sig: Take 1 tablet (25 mg total) by mouth daily.    Dispense:  30 tablet    Refill:  3    Order Specific Question:   Supervising Provider    Answer:   Tresa Garter [id number]  . amLODipine (NORVASC) 10 MG tablet    Sig: Take 1 tablet (10 mg total) by mouth daily.    Dispense:  90 tablet    Refill:  3    Order Specific Question:   Supervising Provider    Answer:   Tresa Garter [id number]  . gabapentin (NEURONTIN) 300 MG capsule    Sig: Take 1 capsule (300 mg total) by mouth 3 (three) times daily. 1 cap x 1 week then increase to 2 caps po qhs    Dispense:  90 capsule    Refill:  2    Order Specific Question:   Supervising Provider    Answer:   Tresa Garter [id number]  . pravastatin (PRAVACHOL) 40 MG tablet    Sig: Take 1 tablet (40 mg total) by mouth daily.    Dispense:  90 tablet    Refill:  1    Order Specific Question:   Supervising Provider    Answer:   Tresa Garter [id number]  . traZODone (DESYREL) 50 MG tablet    Sig: Take 1 tablet (50 mg total) by mouth at bedtime as needed for sleep.    Dispense:  30 tablet    Refill:  3    Order Specific Question:   Supervising Provider    Answer:   Tresa Garter [id number]  . colchicine 0.6 MG tablet    Sig: Take 2 tablets once, then 0ne tablet one hour later.    Dispense:  3 tablet    Refill:  0    Order Specific Question:   Supervising Provider    Answer:   Tresa Garter [[id number]]     Vevelyn Francois, NP

## 2020-03-06 LAB — URINE CULTURE

## 2020-03-11 ENCOUNTER — Other Ambulatory Visit: Payer: Medicare Other

## 2020-03-11 ENCOUNTER — Other Ambulatory Visit: Payer: Self-pay

## 2020-03-11 DIAGNOSIS — E785 Hyperlipidemia, unspecified: Secondary | ICD-10-CM

## 2020-03-11 DIAGNOSIS — Z0000 Encounter for general adult medical examination without abnormal findings: Secondary | ICD-10-CM | POA: Diagnosis not present

## 2020-03-11 DIAGNOSIS — M109 Gout, unspecified: Secondary | ICD-10-CM | POA: Diagnosis not present

## 2020-03-11 DIAGNOSIS — I10 Essential (primary) hypertension: Secondary | ICD-10-CM | POA: Diagnosis not present

## 2020-03-12 LAB — COMP. METABOLIC PANEL (12)
AST: 24 IU/L (ref 0–40)
Albumin/Globulin Ratio: 1.8 (ref 1.2–2.2)
Albumin: 4.2 g/dL (ref 3.8–4.8)
Alkaline Phosphatase: 96 IU/L (ref 48–121)
BUN/Creatinine Ratio: 16 (ref 10–24)
BUN: 26 mg/dL (ref 8–27)
Bilirubin Total: 0.4 mg/dL (ref 0.0–1.2)
Calcium: 9.4 mg/dL (ref 8.6–10.2)
Chloride: 104 mmol/L (ref 96–106)
Creatinine, Ser: 1.66 mg/dL — ABNORMAL HIGH (ref 0.76–1.27)
GFR calc Af Amer: 48 mL/min/{1.73_m2} — ABNORMAL LOW (ref >59)
GFR calc non Af Amer: 41 mL/min/{1.73_m2} — ABNORMAL LOW (ref >59)
Globulin, Total: 2.3 g/dL (ref 1.5–4.5)
Glucose: 106 mg/dL — ABNORMAL HIGH (ref 65–99)
Potassium: 3.8 mmol/L (ref 3.5–5.2)
Sodium: 141 mmol/L (ref 134–144)
Total Protein: 6.5 g/dL (ref 6.0–8.5)

## 2020-03-12 LAB — LIPID PANEL
Chol/HDL Ratio: 3.6 ratio (ref 0.0–5.0)
Cholesterol, Total: 197 mg/dL (ref 100–199)
HDL: 54 mg/dL (ref >39)
LDL Chol Calc (NIH): 116 mg/dL — ABNORMAL HIGH (ref 0–99)
Triglycerides: 155 mg/dL — ABNORMAL HIGH (ref 0–149)
VLDL Cholesterol Cal: 27 mg/dL (ref 5–40)

## 2020-03-12 LAB — URIC ACID: Uric Acid: 9.4 mg/dL — ABNORMAL HIGH (ref 3.8–8.4)

## 2020-03-12 LAB — PSA: Prostate Specific Ag, Serum: 0.4 ng/mL (ref 0.0–4.0)

## 2020-03-12 LAB — SEDIMENTATION RATE: Sed Rate: 11 mm/hr (ref 0–30)

## 2020-03-12 LAB — MAGNESIUM: Magnesium: 1.8 mg/dL (ref 1.6–2.3)

## 2020-03-13 ENCOUNTER — Telehealth: Payer: Self-pay | Admitting: Hematology

## 2020-03-13 NOTE — Telephone Encounter (Signed)
Left voice messsage for patient to return call for labs results.

## 2020-03-13 NOTE — Telephone Encounter (Signed)
-----   Message from Vevelyn Francois, NP sent at 03/13/2020  1:17 PM EDT ----- Labs overall are stable.  Serum creatinine is slightly elevated.  We will continue to monitor.  Verify if patient is using both ibuprofen and meloxicam regularly.  Thanks

## 2020-04-15 MED FILL — traZODone HCL 50 MG TABS: 50 | 30 days supply | Qty: 30 | Fill #1

## 2020-04-15 MED FILL — AMLODIPINE BESYLATE 10 MG T: 10 | 30 days supply | Qty: 30 | Fill #1

## 2020-04-23 MED FILL — GABAPENTIN 300 MG CAPSULE: 300 | 30 days supply | Qty: 90 | Fill #1

## 2020-05-14 MED FILL — AMLODIPINE BESYLATE 10 MG T: 10 | 30 days supply | Qty: 30 | Fill #2

## 2020-05-14 MED FILL — LISINOPRIL 20 MG TABLET: 20 | 30 days supply | Qty: 30 | Fill #3

## 2020-05-14 MED FILL — PRAVASTATIN NA 40 MG TAB: 40 | 30 days supply | Qty: 30 | Fill #2

## 2020-05-14 MED FILL — HYDROCHLOROTHIAZIDE 25 MG T: 25 | 30 days supply | Qty: 30 | Fill #0

## 2020-06-03 ENCOUNTER — Encounter: Payer: Self-pay | Admitting: Nurse Practitioner

## 2020-06-03 ENCOUNTER — Other Ambulatory Visit: Payer: Self-pay | Admitting: Nurse Practitioner

## 2020-06-03 ENCOUNTER — Ambulatory Visit (INDEPENDENT_AMBULATORY_CARE_PROVIDER_SITE_OTHER): Payer: Medicare Other | Admitting: Nurse Practitioner

## 2020-06-03 ENCOUNTER — Other Ambulatory Visit: Payer: Self-pay

## 2020-06-03 VITALS — BP 150/80 | HR 62 | Temp 98.0°F | Ht 65.0 in | Wt 170.4 lb

## 2020-06-03 DIAGNOSIS — N3281 Overactive bladder: Secondary | ICD-10-CM

## 2020-06-03 DIAGNOSIS — M5431 Sciatica, right side: Secondary | ICD-10-CM

## 2020-06-03 DIAGNOSIS — M109 Gout, unspecified: Secondary | ICD-10-CM

## 2020-06-03 DIAGNOSIS — I10 Essential (primary) hypertension: Secondary | ICD-10-CM | POA: Diagnosis not present

## 2020-06-03 LAB — POCT URINALYSIS DIPSTICK OB
Bilirubin, UA: NEGATIVE
Blood, UA: NEGATIVE
Glucose, UA: NEGATIVE
Ketones, UA: NEGATIVE
Nitrite, UA: NEGATIVE
POC,PROTEIN,UA: NEGATIVE
Spec Grav, UA: 1.025 (ref 1.010–1.025)
Urobilinogen, UA: 0.2 E.U./dL
pH, UA: 6.5 (ref 5.0–8.0)

## 2020-06-03 MED ORDER — IBUPROFEN 800 MG PO TABS: 800.0000 mg | ORAL_TABLET | Freq: Three times a day (TID) | ORAL | 3 refills | Status: DC | PRN

## 2020-06-03 MED ORDER — SOLIFENACIN SUCCINATE 10 MG PO TABS
10.0000 mg | ORAL_TABLET | Freq: Every day | ORAL | 11 refills | Status: DC
Start: 2020-06-03 — End: 2020-06-03

## 2020-06-03 MED FILL — IBUPROFEN 800 MG TABLET: 800 | 30 days supply | Qty: 90 | Fill #0

## 2020-06-03 NOTE — Patient Instructions (Signed)

## 2020-06-03 NOTE — Progress Notes (Signed)
[place], [place]  [address] Phone:  [phone]   Fax:  [phone]   Established Patient Office Visit  Subjective:  Patient ID: Daniel Haynes, male    DOB: January 02, 1951  Age: 69 y.o. MRN: [id number]  CC:  Chief Complaint  Patient presents with   Follow-up    sciatic nerve pain    HPI [place] Winterhalter presents for sciatic nerve pain. He  has a past medical history of Arthritis, Gout, Hyperlipidemia, Hypertension, and Insomnia (08/2019).   Hypertension Patient is here for follow-up of elevated blood pressure. He is not exercising; he currently works full-time as a Firefighter and is very active and is adherent to a low-salt diet. Blood pressure is not monitored at home. Cardiac symptoms: none. Patient denies chest pain, dyspnea, fatigue, irregular heart beat, lower extremity edema, orthopnea, palpitations and syncope. Cardiovascular risk factors: advanced age (older than 58 for men, 66 for women), dyslipidemia, hypertension and male gender. Use of agents associated with hypertension: NSAIDS. History of target organ damage: none.  Urinary Incontinence Patient complains of urinary incontinence. This has been present for several months. He leaks urine with lifting, walking. Patient describes the symptoms as a blunted sense of urge to urinate and urine leakage with coughing/heavy physical activity. Factors associated with symptoms include none known. Evaluation to date includes UA/CS: abnormal: trace leukocytes and Urodynamics:. Treatment to date includes  Mirabegron.  However this is expensive so he is no longer able to afford.  He admits that he was on a program at one time however he was turned down recently.  Past Medical History:  Diagnosis Date   Arthritis    hands fingers wrists    Gout    Hyperlipidemia    Hypertension    Insomnia 08/2019    Past Surgical History:  Procedure Laterality Date   CERVICAL DISCECTOMY   2004   [facility]    Family History  Problem Relation Age of Onset   Cancer Mother    Cancer Father    Colon cancer Neg Hx    Colon polyps Neg Hx    Esophageal cancer Neg Hx    Rectal cancer Neg Hx    Stomach cancer Neg Hx     Social History   Socioeconomic History   Marital status: Single    Spouse name: Not on file   Number of children: Not on file   Years of education: Not on file   Highest education level: Not on file  Occupational History   Not on file  Tobacco Use   Smoking status: Former Smoker   Smokeless tobacco: Never Used  Substance and Sexual Activity   Alcohol use: Yes    Alcohol/week: 21.0 standard drinks    Types: 21 Shots of liquor per week    Comment: 3 a day    Drug use: Yes    Types: Marijuana    Comment: occ use    Sexual activity: Not on file  Other Topics Concern   Not on file  Social History Narrative   Not on file   Social Determinants of Health   Financial Resource Strain:    Difficulty of Paying Living Expenses:   Food Insecurity:    Worried About Charity fundraiser in the Last Year:    Arboriculturist in the Last Year:   Transportation Needs:    Film/video editor (Medical):    Lack of Transportation (Non-Medical):  Physical Activity:    Days of Exercise per Week:    Minutes of Exercise per Session:   Stress:    Feeling of Stress :   Social Connections:    Frequency of Communication with Friends and Family:    Frequency of Social Gatherings with Friends and Family:    Attends Religious Services:    Active Member of Clubs or Organizations:    Attends Music therapist:    Marital Status:   Intimate Partner Violence:    Fear of Current or Ex-Partner:    Emotionally Abused:    Physically Abused:    Sexually Abused:     Outpatient Medications Prior to Visit  Medication Sig Dispense Refill   amLODipine (NORVASC) 10 MG tablet Take 1 tablet (10 mg total) by mouth daily. 90  tablet 3   hydrochlorothiazide (HYDRODIURIL) 25 MG tablet Take 1 tablet (25 mg total) by mouth daily. 30 tablet 3   lisinopril (ZESTRIL) 20 MG tablet Take 1 tablet (20 mg total) by mouth daily. 30 tablet 3   mirabegron ER (MYRBETRIQ) 25 MG TB24 tablet Take 1 tablet (25 mg total) by mouth daily. 30 tablet 3   pravastatin (PRAVACHOL) 40 MG tablet Take 1 tablet (40 mg total) by mouth daily. 90 tablet 1   traZODone (DESYREL) 50 MG tablet Take 1 tablet (50 mg total) by mouth at bedtime as needed for sleep. 30 tablet 3   ibuprofen (ADVIL,MOTRIN) 800 MG tablet Take 1 tablet (800 mg total) by mouth every 8 (eight) hours as needed. 30 tablet 0   Blood Pressure Monitor KIT 1 Units by Does not apply route daily. (Patient not taking: Reported on 03/04/2020) 1 each 0   gabapentin (NEURONTIN) 300 MG capsule Take 1 capsule (300 mg total) by mouth 3 (three) times daily. 90 capsule 2   colchicine 0.6 MG tablet Take 2 tablets once, then 0ne tablet one hour later. (Patient not taking: Reported on 06/03/2020) 3 tablet 0   cyclobenzaprine (FLEXERIL) 10 MG tablet Take 1 tablet (10 mg total) by mouth at bedtime. (Patient not taking: Reported on 03/04/2020) 30 tablet 2   meloxicam (MOBIC) 15 MG tablet TAKE 1 TABLET BY MOUTH DAILY. (Patient not taking: Reported on 03/04/2020) 30 tablet 2   No facility-administered medications prior to visit.    No Known Allergies  ROS Review of Systems  Constitutional: Negative.   HENT: Negative.   Eyes: Negative.   Respiratory: Negative.   Cardiovascular: Negative.   Gastrointestinal: Negative.   Endocrine: Negative.   Genitourinary: Positive for frequency.  Skin: Negative.   Allergic/Immunologic: Negative.   Neurological: Negative.       Objective:    Physical Exam Vitals reviewed.  Cardiovascular:     Rate and Rhythm: Normal rate and regular rhythm.     Pulses: Normal pulses.     Heart sounds: Normal heart sounds.  Pulmonary:     Effort: Pulmonary effort  is normal.     Breath sounds: Normal breath sounds.  Abdominal:     Tenderness: There is abdominal tenderness.  Musculoskeletal:     Cervical back: Normal range of motion.  Skin:    General: Skin is warm and dry.     Capillary Refill: Capillary refill takes less than 2 seconds.  Neurological:     General: No focal deficit present.     Mental Status: He is alert and oriented to person, place, and time.  Psychiatric:        Mood and  Affect: Mood normal.        Behavior: Behavior normal.        Thought Content: Thought content normal.        Judgment: Judgment normal.     BP (!) 150/80    Pulse 62    Temp 98 F (36.7 C)    Ht _0  (1.651 m)    Wt 170 lb 6.4 oz (77.3 kg)    SpO2 99%    BMI 28.36 kg/m  Wt Readings from Last 3 Encounters:  06/03/20 170 lb 6.4 oz (77.3 kg)  03/04/20 173 lb (78.5 kg)  08/25/19 173 lb 6.4 oz (78.7 kg)     There are no preventive care reminders to display for this patient.  There are no preventive care reminders to display for this patient.  Lab Results  Component Value Date   TSH 0.744 08/25/2019   Lab Results  Component Value Date   WBC 5.2 06/29/2017   HGB 13.7 06/29/2017   HCT 40.3 06/29/2017   MCV 87.4 06/29/2017   PLT 284 06/29/2017   Lab Results  Component Value Date   NA 141 03/11/2020   K 3.8 03/11/2020   CO2 23 06/29/2017   GLUCOSE 106 (H) 03/11/2020   BUN 26 03/11/2020   CREATININE 1.66 (H) 03/11/2020   BILITOT 0.4 03/11/2020   ALKPHOS 96 03/11/2020   AST 24 03/11/2020   ALT 17 06/29/2017   PROT 6.5 03/11/2020   ALBUMIN 4.2 03/11/2020   CALCIUM 9.4 03/11/2020   ANIONGAP 13 12/30/2015   Lab Results  Component Value Date   CHOL 197 03/11/2020   Lab Results  Component Value Date   HDL 54 03/11/2020   Lab Results  Component Value Date   LDLCALC 116 (H) 03/11/2020   Lab Results  Component Value Date   TRIG 155 (H) 03/11/2020   Lab Results  Component Value Date   CHOLHDL 3.6 03/11/2020   Lab Results    Component Value Date   HGBA1C 5.1 08/25/2019      Assessment & Plan:   Problem List Items Addressed This Visit      Other   Gout    Other Visit Diagnoses    Overactive bladder    -  Primary Trial of Vesicare 10 mg daily Maintain a healthy weight with regular low impact exercise Monitor diet and daily habits via journals .    Relevant Orders   Microalbumin / creatinine urine ratio   POC Urinalysis Dipstick OB (Completed)   Sciatica of right side     Hand written Rx for Pt referral   Relevant Medications   ibuprofen (ADVIL) 800 MG tablet   Hypertension, unspecified type     Encouraged on going compliance with current medication regimen Encouraged home monitoring and recording BP <130/80 Eating a heart-healthy diet with less salt Encouraged regular physical activity     Relevant Orders   CBC with Differential/Platelet   Comp. Metabolic Panel (12)   Sedimentation rate    Meds ordered this encounter  Medications   ibuprofen (ADVIL) 800 MG tablet    Sig: Take 1 tablet (800 mg total) by mouth every 8 (eight) hours as needed.    Dispense:  90 tablet    Refill:  3    Order Specific Question:   Supervising Provider    Answer:   Tresa Garter [[id number]]   solifenacin (VESICARE) 10 MG tablet    Sig: Take 1 tablet (10 mg total) by mouth  daily.    Dispense:  30 tablet    Refill:  11    Order Specific Question:   Supervising Provider    Answer:   Tresa Garter [[id number]]    Follow-up: Return in about 3 months (around 09/03/2020).    Vevelyn Francois, NP

## 2020-06-04 LAB — MICROALBUMIN / CREATININE URINE RATIO
Creatinine, Urine: 94.5 mg/dL
Microalb/Creat Ratio: 12 mg/g creat (ref 0–29)
Microalbumin, Urine: 11.5 ug/mL

## 2020-06-05 LAB — CBC WITH DIFFERENTIAL/PLATELET
Basophils Absolute: 0 10*3/uL (ref 0.0–0.2)
Basos: 0 %
EOS (ABSOLUTE): 0.2 10*3/uL (ref 0.0–0.4)
Eos: 4 %
Hematocrit: 37.8 % (ref 37.5–51.0)
Hemoglobin: 13.2 g/dL (ref 13.0–17.7)
Immature Grans (Abs): 0 10*3/uL (ref 0.0–0.1)
Immature Granulocytes: 0 %
Lymphocytes Absolute: 1.7 10*3/uL (ref 0.7–3.1)
Lymphs: 28 %
MCH: 31.7 pg (ref 26.6–33.0)
MCHC: 34.9 g/dL (ref 31.5–35.7)
MCV: 91 fL (ref 79–97)
Monocytes Absolute: 0.6 10*3/uL (ref 0.1–0.9)
Monocytes: 9 %
Neutrophils Absolute: 3.4 10*3/uL (ref 1.4–7.0)
Neutrophils: 59 %
Platelets: 265 10*3/uL (ref 150–450)
RBC: 4.17 x10E6/uL (ref 4.14–5.80)
RDW: 12.9 % (ref 11.6–15.4)
WBC: 5.9 10*3/uL (ref 3.4–10.8)

## 2020-06-05 LAB — SEDIMENTATION RATE: Sed Rate: 23 mm/hr (ref 0–30)

## 2020-06-05 LAB — COMP. METABOLIC PANEL (12)
AST: 22 IU/L (ref 0–40)
Albumin/Globulin Ratio: 1.6 (ref 1.2–2.2)
Albumin: 4.6 g/dL (ref 3.8–4.8)
Alkaline Phosphatase: 91 IU/L (ref 48–121)
BUN/Creatinine Ratio: 15 (ref 10–24)
BUN: 26 mg/dL (ref 8–27)
Bilirubin Total: 0.4 mg/dL (ref 0.0–1.2)
Calcium: 9.7 mg/dL (ref 8.6–10.2)
Chloride: 100 mmol/L (ref 96–106)
Creatinine, Ser: 1.76 mg/dL — ABNORMAL HIGH (ref 0.76–1.27)
GFR calc Af Amer: 45 mL/min/{1.73_m2} — ABNORMAL LOW (ref >59)
GFR calc non Af Amer: 39 mL/min/{1.73_m2} — ABNORMAL LOW (ref >59)
Globulin, Total: 2.9 g/dL (ref 1.5–4.5)
Glucose: 99 mg/dL (ref 65–99)
Potassium: 4 mmol/L (ref 3.5–5.2)
Sodium: 141 mmol/L (ref 134–144)
Total Protein: 7.5 g/dL (ref 6.0–8.5)

## 2020-06-05 MED FILL — SOLIFENACIN SUCCINATE 10 MG: 10 | 30 days supply | Qty: 30 | Fill #0

## 2020-06-05 MED FILL — GABAPENTIN 300 MG CAPSULE: 300 | 30 days supply | Qty: 90 | Fill #2

## 2020-06-06 ENCOUNTER — Encounter: Payer: Self-pay | Admitting: Nurse Practitioner

## 2020-06-06 DIAGNOSIS — N1831 Chronic kidney disease, stage 3a: Secondary | ICD-10-CM | POA: Insufficient documentation

## 2020-06-19 MED FILL — HYDROCHLOROTHIAZIDE 25 MG T: 25 | 30 days supply | Qty: 30 | Fill #1

## 2020-06-19 MED FILL — LISINOPRIL 20 MG TABLET: 20 | 30 days supply | Qty: 30 | Fill #0

## 2020-06-19 MED FILL — AMLODIPINE BESYLATE 10 MG T: 10 | 30 days supply | Qty: 30 | Fill #3

## 2020-06-19 MED FILL — PRAVASTATIN NA 40 MG TAB: 40 | 30 days supply | Qty: 30 | Fill #3

## 2020-07-03 MED FILL — SOLIFENACIN SUCCINATE 10 MG: 10 | 30 days supply | Qty: 30 | Fill #1

## 2020-07-25 ENCOUNTER — Other Ambulatory Visit: Payer: Self-pay | Admitting: Nurse Practitioner

## 2020-07-25 DIAGNOSIS — M5431 Sciatica, right side: Secondary | ICD-10-CM

## 2020-07-25 MED FILL — LISINOPRIL 20 MG TABLET: 20 | 30 days supply | Qty: 30 | Fill #1

## 2020-07-25 MED FILL — PRAVASTATIN NA 40 MG TAB: 40 | 30 days supply | Qty: 30 | Fill #4

## 2020-07-25 MED FILL — HYDROCHLOROTHIAZIDE 25 MG T: 25 | 30 days supply | Qty: 30 | Fill #2

## 2020-08-01 ENCOUNTER — Other Ambulatory Visit: Payer: Self-pay | Admitting: Nurse Practitioner

## 2020-08-02 MED FILL — GABAPENTIN 300 MG CAPSULE: 300 | 30 days supply | Qty: 90 | Fill #0

## 2020-08-09 MED FILL — GABAPENTIN 300 MG CAPSULE: 300 | 30 days supply | Qty: 90 | Fill #0

## 2020-08-12 MED FILL — AMLODIPINE BESYLATE 10 MG T: 10 | 30 days supply | Qty: 30 | Fill #4

## 2020-08-16 MED FILL — SOLIFENACIN SUCCINATE 10 MG: 10 | 30 days supply | Qty: 30 | Fill #2

## 2020-09-02 ENCOUNTER — Encounter: Payer: Self-pay | Admitting: Nurse Practitioner

## 2020-09-02 ENCOUNTER — Other Ambulatory Visit: Payer: Self-pay | Admitting: Nurse Practitioner

## 2020-09-02 ENCOUNTER — Other Ambulatory Visit: Payer: Self-pay

## 2020-09-02 ENCOUNTER — Ambulatory Visit (INDEPENDENT_AMBULATORY_CARE_PROVIDER_SITE_OTHER): Payer: Medicare Other | Admitting: Nurse Practitioner

## 2020-09-02 VITALS — BP 132/79 | HR 61 | Temp 97.5°F | Resp 16 | Ht 65.0 in | Wt 168.2 lb

## 2020-09-02 DIAGNOSIS — N3281 Overactive bladder: Secondary | ICD-10-CM | POA: Diagnosis not present

## 2020-09-02 DIAGNOSIS — E785 Hyperlipidemia, unspecified: Secondary | ICD-10-CM

## 2020-09-02 DIAGNOSIS — R350 Frequency of micturition: Secondary | ICD-10-CM | POA: Diagnosis not present

## 2020-09-02 DIAGNOSIS — N401 Enlarged prostate with lower urinary tract symptoms: Secondary | ICD-10-CM | POA: Diagnosis not present

## 2020-09-02 DIAGNOSIS — G4701 Insomnia due to medical condition: Secondary | ICD-10-CM

## 2020-09-02 DIAGNOSIS — I10 Essential (primary) hypertension: Secondary | ICD-10-CM | POA: Diagnosis not present

## 2020-09-02 DIAGNOSIS — N1831 Chronic kidney disease, stage 3a: Secondary | ICD-10-CM | POA: Diagnosis not present

## 2020-09-02 DIAGNOSIS — M5431 Sciatica, right side: Secondary | ICD-10-CM | POA: Diagnosis not present

## 2020-09-02 MED ORDER — HYDROCHLOROTHIAZIDE 25 MG PO TABS: 25.0000 mg | ORAL_TABLET | Freq: Every day | ORAL | 3 refills | Status: DC

## 2020-09-02 MED ORDER — TRAZODONE HCL 50 MG PO TABS: 50.0000 mg | ORAL_TABLET | Freq: Every evening | ORAL | 3 refills | Status: DC | PRN

## 2020-09-02 MED ORDER — LISINOPRIL 20 MG PO TABS: 20.0000 mg | ORAL_TABLET | Freq: Every day | ORAL | 3 refills | Status: DC

## 2020-09-02 MED FILL — traZODone HCL 50 MG TABS: 50 | 30 days supply | Qty: 30 | Fill #0

## 2020-09-02 MED FILL — LISINOPRIL 20 MG TABLET: 20 | 30 days supply | Qty: 30 | Fill #0

## 2020-09-02 MED FILL — HYDROCHLOROTHIAZIDE 25 MG T: 25 | 30 days supply | Qty: 30 | Fill #0

## 2020-09-02 NOTE — Patient Instructions (Signed)

## 2020-09-02 NOTE — Progress Notes (Signed)
Established Patient Office Visit  Subjective:  Patient ID: Daniel Haynes, male    DOB: [birth year]  Age: 69 y.o. MRN: [id number]  CC:  Chief Complaint  Patient presents with  . Follow-up C/o excessive urination at night (7-8 times), Vesicare not helping     HPI Daniel Haynes presents for follow. He  has a past medical history of Arthritis, Gout, Hyperlipidemia, Hypertension, and Insomnia (08/2019).   Hypertension Patient is here for follow-up of elevated blood pressure. He is exercising and is adherent to a low-salt diet. Blood pressure is well controlled at home. Cardiac symptoms: none. Patient denies chest pain, dyspnea, fatigue, irregular heart beat, lower extremity edema, palpitations and syncope. Cardiovascular risk factors: advanced age (older than 28 for men, 56 for women), dyslipidemia, hypertension and male gender. Use of agents associated with hypertension: none. History of target organ damage: none.  He his son of 6 had throat cancer. He denies depression as it relates to this   He has left hip burning. He admits that it is worse with lying down. He has some heat in the calf and ankle.   Benign Prostatic Hypertrophy Patient complains of lower urinary tract symptoms. He reports frequency.Patient complains of urinary incontinence.  He leaks urine with bending, standing, working. Patient describes the symptoms as urge to urinate with little or no warning.  He denies straining. Patient states symptoms are of severe severity. Onset of symptoms was several years ago and was gradual in onset. His AUA Symptom Score is, 25./35. manifested as irritative symptoms including frequency, urgency, nocturia. He has no personal history of prostate cancer. He reports a history of no complicating symptoms. He denies flank pain, gross hematuria, kidney stones and recurrent UTI.   Past Medical History:  Diagnosis Date  . Arthritis    hands fingers wrists   . Gout   . Hyperlipidemia   . Hypertension    . Insomnia 08/2019    Past Surgical History:  Procedure Laterality Date  . CERVICAL DISCECTOMY  2004   [facility]    Family History  Problem Relation Age of Onset  . Cancer Mother   . Cancer Father   . Colon cancer Neg Hx   . Colon polyps Neg Hx   . Esophageal cancer Neg Hx   . Rectal cancer Neg Hx   . Stomach cancer Neg Hx     Social History   Socioeconomic History  . Marital status: Single    Spouse name: Not on file  . Number of children: Not on file  . Years of education: Not on file  . Highest education level: Not on file  Occupational History  . Not on file  Tobacco Use  . Smoking status: Former Research scientist (life sciences)  . Smokeless tobacco: Never Used  Substance and Sexual Activity  . Alcohol use: Yes    Alcohol/week: 21.0 standard drinks    Types: 21 Shots of liquor per week    Comment: 3 a day   . Drug use: Yes    Types: Marijuana    Comment: occ use   . Sexual activity: Not on file  Other Topics Concern  . Not on file  Social History Narrative  . Not on file   Social Determinants of Health   Financial Resource Strain:   . Difficulty of Paying Living Expenses: Not on file  Food Insecurity:   . Worried About Charity fundraiser in the Last Year: Not on file  . Ran Out of Food in the  Last Year: Not on file  Transportation Needs:   . Lack of Transportation (Medical): Not on file  . Lack of Transportation (Non-Medical): Not on file  Physical Activity:   . Days of Exercise per Week: Not on file  . Minutes of Exercise per Session: Not on file  Stress:   . Feeling of Stress : Not on file  Social Connections:   . Frequency of Communication with Friends and Family: Not on file  . Frequency of Social Gatherings with Friends and Family: Not on file  . Attends Religious Services: Not on file  . Active Member of Clubs or Organizations: Not on file  . Attends Archivist Meetings: Not on file  . Marital Status: Not on file  Intimate Partner Violence:   . Fear of  Current or Ex-Partner: Not on file  . Emotionally Abused: Not on file  . Physically Abused: Not on file  . Sexually Abused: Not on file    Outpatient Medications Prior to Visit  Medication Sig Dispense Refill  . amLODipine (NORVASC) 10 MG tablet Take 1 tablet (10 mg total) by mouth daily. 90 tablet 3  . gabapentin (NEURONTIN) 300 MG capsule TAKE 1 CAPSULE (300 MG TOTAL) BY MOUTH 3 (THREE) TIMES DAILY. 90 capsule 2  . ibuprofen (ADVIL) 800 MG tablet Take 1 tablet (800 mg total) by mouth every 8 (eight) hours as needed. 90 tablet 3  . pravastatin (PRAVACHOL) 40 MG tablet Take 1 tablet (40 mg total) by mouth daily. 90 tablet 1  . solifenacin (VESICARE) 10 MG tablet Take 1 tablet (10 mg total) by mouth daily. 30 tablet 11  . hydrochlorothiazide (HYDRODIURIL) 25 MG tablet Take 1 tablet (25 mg total) by mouth daily. 30 tablet 3  . lisinopril (ZESTRIL) 20 MG tablet Take 1 tablet (20 mg total) by mouth daily. 30 tablet 3  . Blood Pressure Monitor KIT 1 Units by Does not apply route daily. (Patient not taking: Reported on 03/04/2020) 1 each 0  . mirabegron ER (MYRBETRIQ) 25 MG TB24 tablet Take 1 tablet (25 mg total) by mouth daily. (Patient not taking: Reported on 09/02/2020) 30 tablet 3  . traZODone (DESYREL) 50 MG tablet Take 1 tablet (50 mg total) by mouth at bedtime as needed for sleep. (Patient not taking: Reported on 09/02/2020) 30 tablet 3   No facility-administered medications prior to visit.    No Known Allergies  ROS Review of Systems  Constitutional: Negative.   Eyes: Negative.   Respiratory: Negative for shortness of breath.   Cardiovascular: Negative for chest pain.  Gastrointestinal: Negative for constipation, diarrhea and nausea.  Genitourinary: Positive for frequency.       Hesitancy and nocturia incontinence occasional   Musculoskeletal:       Right hip pain   Skin: Negative.   Allergic/Immunologic: Negative.   Neurological: Negative for dizziness and headaches.   Hematological: Negative.       Objective:    Physical Exam Constitutional:      General: He is not in acute distress.    Appearance: He is normal weight. He is not ill-appearing, toxic-appearing or diaphoretic.  HENT:     Head: Normocephalic and atraumatic.     Nose: Nose normal.  Cardiovascular:     Rate and Rhythm: Normal rate and regular rhythm.     Pulses: Normal pulses.     Heart sounds: Normal heart sounds.  Pulmonary:     Effort: Pulmonary effort is normal.     Breath sounds:  Normal breath sounds.  Abdominal:     Palpations: Abdomen is soft.  Musculoskeletal:     Cervical back: Normal range of motion.     Right lower leg: No edema.     Left lower leg: No edema.  Skin:    General: Skin is warm and dry.     Capillary Refill: Capillary refill takes less than 2 seconds.  Neurological:     General: No focal deficit present.     Mental Status: He is alert and oriented to person, place, and time.  Psychiatric:        Mood and Affect: Mood normal.        Behavior: Behavior normal.        Thought Content: Thought content normal.        Judgment: Judgment normal.     BP 132/79 (BP Location: Left Arm, Patient Position: Sitting, Cuff Size: Normal)   Pulse 61   Temp (!) 97.5 F (36.4 C) (Temporal)   Resp 16   Ht '5\' 5"'  (1.651 m)   Wt 168 lb 3.2 oz (76.3 kg)   SpO2 99%   BMI 27.99 kg/m  Wt Readings from Last 3 Encounters:  09/02/20 168 lb 3.2 oz (76.3 kg)  06/03/20 170 lb 6.4 oz (77.3 kg)  03/04/20 173 lb (78.5 kg)     There are no preventive care reminders to display for this patient.  There are no preventive care reminders to display for this patient.  Lab Results  Component Value Date   TSH 0.744 08/25/2019   Lab Results  Component Value Date   WBC 5.9 06/03/2020   HGB 13.2 06/03/2020   HCT 37.8 06/03/2020   MCV 91 06/03/2020   PLT 265 06/03/2020   Lab Results  Component Value Date   NA 144 09/02/2020   K 4.0 09/02/2020   CO2 23 06/29/2017    GLUCOSE 92 09/02/2020   BUN 24 09/02/2020   CREATININE 1.45 (H) 09/02/2020   BILITOT 0.5 09/02/2020   ALKPHOS 85 09/02/2020   AST 18 09/02/2020   ALT 17 06/29/2017   PROT 7.2 09/02/2020   ALBUMIN 4.4 09/02/2020   CALCIUM 9.3 09/02/2020   ANIONGAP 13 12/30/2015   Lab Results  Component Value Date   CHOL 197 03/11/2020   Lab Results  Component Value Date   HDL 54 03/11/2020   Lab Results  Component Value Date   LDLCALC 116 (H) 03/11/2020   Lab Results  Component Value Date   TRIG 155 (H) 03/11/2020   Lab Results  Component Value Date   CHOLHDL 3.6 03/11/2020   Lab Results  Component Value Date   HGBA1C 5.1 08/25/2019      Assessment & Plan:   Problem List Items Addressed This Visit    None    Visit Diagnoses    Essential hypertension    -  Primary Encouraged on going compliance with current medication regimen Encouraged home monitoring and recording BP <130/80 Eating a heart-healthy diet with less salt Encouraged regular physical activity  Recommend Weight loss   Relevant Medications   lisinopril (ZESTRIL) 20 MG tablet   hydrochlorothiazide (HYDRODIURIL) 25 MG tablet   Other Relevant Orders   Comp. Metabolic Panel (12) (Completed)   Overactive bladder  We will continue with Vesicare since covered by insurance will trial Flomax to see if this is effective      Dyslipidemia    Stable we will continue with pravastatin 40 mg daily   Insomnia due  to medical condition     We will see if Flomax is effective for nocturia   Relevant Medications   traZODone (DESYREL) 50 MG tablet   Sciatica of right side     We will continue with gabapentin and ibuprofen.  Will complete work-up with imaging if not effective   Relevant Medications   traZODone (DESYREL) 50 MG tablet   Benign prostatic hyperplasia with urinary frequency  Trial of tamsulosin if covered by insurance for increased urinary frequency previous treatments not effective      Relevant Medications    tamsulosin (FLOMAX) 0.4 MG CAPS capsule      Meds ordered this encounter  Medications  . lisinopril (ZESTRIL) 20 MG tablet    Sig: Take 1 tablet (20 mg total) by mouth daily.    Dispense:  90 tablet    Refill:  3    Order Specific Question:   Supervising Provider    Answer:   Tresa Garter [id number]  . hydrochlorothiazide (HYDRODIURIL) 25 MG tablet    Sig: Take 1 tablet (25 mg total) by mouth daily.    Dispense:  90 tablet    Refill:  3    Order Specific Question:   Supervising Provider    Answer:   Tresa Garter [id number]  . traZODone (DESYREL) 50 MG tablet    Sig: Take 1 tablet (50 mg total) by mouth at bedtime as needed for sleep.    Dispense:  90 tablet    Refill:  3    Order Specific Question:   Supervising Provider    Answer:   Tresa Garter [id number]  . tamsulosin (FLOMAX) 0.4 MG CAPS capsule    Sig: Take 1 capsule (0.4 mg total) by mouth daily after breakfast.    Dispense:  90 capsule    Refill:  3    Order Specific Question:   Supervising Provider    Answer:   Tresa Garter [[id number]]    Follow-up: Return in about 3 months (around 12/03/2020).    Vevelyn Francois, NP

## 2020-09-03 LAB — COMP. METABOLIC PANEL (12)
AST: 18 IU/L (ref 0–40)
Albumin/Globulin Ratio: 1.6 (ref 1.2–2.2)
Albumin: 4.4 g/dL (ref 3.8–4.8)
Alkaline Phosphatase: 85 IU/L (ref 44–121)
BUN/Creatinine Ratio: 17 (ref 10–24)
BUN: 24 mg/dL (ref 8–27)
Bilirubin Total: 0.5 mg/dL (ref 0.0–1.2)
Calcium: 9.3 mg/dL (ref 8.6–10.2)
Chloride: 105 mmol/L (ref 96–106)
Creatinine, Ser: 1.45 mg/dL — ABNORMAL HIGH (ref 0.76–1.27)
GFR calc Af Amer: 56 mL/min/{1.73_m2} — ABNORMAL LOW (ref >59)
GFR calc non Af Amer: 49 mL/min/{1.73_m2} — ABNORMAL LOW (ref >59)
Globulin, Total: 2.8 g/dL (ref 1.5–4.5)
Glucose: 92 mg/dL (ref 65–99)
Potassium: 4 mmol/L (ref 3.5–5.2)
Sodium: 144 mmol/L (ref 134–144)
Total Protein: 7.2 g/dL (ref 6.0–8.5)

## 2020-09-06 ENCOUNTER — Other Ambulatory Visit: Payer: Self-pay | Admitting: Nurse Practitioner

## 2020-09-06 MED ORDER — TAMSULOSIN HCL 0.4 MG PO CAPS: 0.4000 mg | ORAL_CAPSULE | Freq: Every day | ORAL | 3 refills | Status: DC

## 2020-09-06 MED FILL — TAMSULOSIN HCL 0.4 MG CAP: 0.4 | 30 days supply | Qty: 30 | Fill #0

## 2020-09-23 MED FILL — AMLODIPINE BESYLATE 10 MG T: 10 | 30 days supply | Qty: 30 | Fill #5

## 2020-09-23 MED FILL — SOLIFENACIN SUCCINATE 10 MG: 10 | 30 days supply | Qty: 30 | Fill #3

## 2020-09-23 MED FILL — PRAVASTATIN NA 40 MG TAB: 40 | 30 days supply | Qty: 30 | Fill #5

## 2020-10-10 MED FILL — GABAPENTIN 300 MG CAPSULE: 300 | 30 days supply | Qty: 90 | Fill #1

## 2020-10-10 MED FILL — HYDROCHLOROTHIAZIDE 25 MG T: 25 | 30 days supply | Qty: 30 | Fill #1

## 2020-10-10 MED FILL — LISINOPRIL 20 MG TABLET: 20 | 30 days supply | Qty: 30 | Fill #1

## 2020-10-10 MED FILL — traZODone HCL 50 MG TABS: 50 | 30 days supply | Qty: 30 | Fill #1

## 2020-10-10 MED FILL — TAMSULOSIN HCL 0.4 MG CAP: 0.4 | 30 days supply | Qty: 30 | Fill #1

## 2020-10-10 MED FILL — traZODone HCL 50 MG TABS: 50 | 30 days supply | Qty: 30 | Fill #2

## 2020-10-14 DIAGNOSIS — Z23 Encounter for immunization: Secondary | ICD-10-CM | POA: Diagnosis not present

## 2020-10-17 MED FILL — TAMSULOSIN HCL 0.4 MG CAP: 0.4 | 30 days supply | Qty: 30 | Fill #1

## 2020-10-17 MED FILL — LISINOPRIL 20 MG TABLET: 20 | 30 days supply | Qty: 30 | Fill #1

## 2020-10-17 MED FILL — HYDROCHLOROTHIAZIDE 25 MG T: 25 | 30 days supply | Qty: 30 | Fill #1

## 2020-10-17 MED FILL — GABAPENTIN 300 MG CAPSULE: 300 | 30 days supply | Qty: 90 | Fill #1

## 2020-10-17 MED FILL — traZODone HCL 50 MG TABS: 50 | 30 days supply | Qty: 30 | Fill #2

## 2020-10-31 MED FILL — AMLODIPINE BESYLATE 10 MG T: 10 | 30 days supply | Qty: 30 | Fill #6

## 2020-10-31 MED FILL — SOLIFENACIN SUCCINATE 10 MG: 10 | 30 days supply | Qty: 30 | Fill #4

## 2020-11-19 MED FILL — HYDROCHLOROTHIAZIDE 25 MG T: 25 | 30 days supply | Qty: 30 | Fill #2

## 2020-11-19 MED FILL — TAMSULOSIN HCL 0.4 MG CAP: 0.4 | 30 days supply | Qty: 30 | Fill #2

## 2020-11-19 MED FILL — LISINOPRIL 20 MG TABLET: 20 | 30 days supply | Qty: 30 | Fill #2

## 2020-12-02 ENCOUNTER — Ambulatory Visit: Payer: Medicare Other | Admitting: Nurse Practitioner

## 2020-12-13 MED FILL — SOLIFENACIN SUCCINATE 10 MG: 10 | 30 days supply | Qty: 30 | Fill #5

## 2020-12-13 MED FILL — AMLODIPINE BESYLATE 10 MG T: 10 | 30 days supply | Qty: 30 | Fill #7

## 2020-12-13 MED FILL — GABAPENTIN 300 MG CAPSULE: 300 | 30 days supply | Qty: 90 | Fill #2

## 2020-12-23 ENCOUNTER — Other Ambulatory Visit: Payer: Self-pay

## 2020-12-23 ENCOUNTER — Ambulatory Visit (HOSPITAL_COMMUNITY)
Admission: RE | Admit: 2020-12-23 | Discharge: 2020-12-23 | Disposition: A | Discharge status: Home / Self Care | Payer: Medicare PPO | Source: Ambulatory Visit | Attending: Nurse Practitioner | Admitting: Nurse Practitioner

## 2020-12-23 ENCOUNTER — Encounter: Payer: Self-pay | Admitting: Nurse Practitioner

## 2020-12-23 ENCOUNTER — Other Ambulatory Visit: Payer: Self-pay | Admitting: Nurse Practitioner

## 2020-12-23 ENCOUNTER — Ambulatory Visit (INDEPENDENT_AMBULATORY_CARE_PROVIDER_SITE_OTHER): Payer: Medicare PPO | Admitting: Nurse Practitioner

## 2020-12-23 VITALS — BP 149/79 | HR 71 | Ht 65.0 in | Wt 166.0 lb

## 2020-12-23 DIAGNOSIS — Z130 Encounter for screening for diseases of the blood and blood-forming organs and certain disorders involving the immune mechanism: Secondary | ICD-10-CM

## 2020-12-23 DIAGNOSIS — G8929 Other chronic pain: Secondary | ICD-10-CM

## 2020-12-23 DIAGNOSIS — M25511 Pain in right shoulder: Secondary | ICD-10-CM

## 2020-12-23 DIAGNOSIS — M791 Myalgia, unspecified site: Secondary | ICD-10-CM

## 2020-12-23 DIAGNOSIS — M19012 Primary osteoarthritis, left shoulder: Secondary | ICD-10-CM | POA: Diagnosis not present

## 2020-12-23 DIAGNOSIS — M25512 Pain in left shoulder: Secondary | ICD-10-CM

## 2020-12-23 DIAGNOSIS — M109 Gout, unspecified: Secondary | ICD-10-CM

## 2020-12-23 DIAGNOSIS — Z1321 Encounter for screening for nutritional disorder: Secondary | ICD-10-CM | POA: Diagnosis not present

## 2020-12-23 DIAGNOSIS — F4321 Adjustment disorder with depressed mood: Secondary | ICD-10-CM | POA: Diagnosis not present

## 2020-12-23 DIAGNOSIS — M19011 Primary osteoarthritis, right shoulder: Secondary | ICD-10-CM | POA: Diagnosis not present

## 2020-12-23 DIAGNOSIS — M2540 Effusion, unspecified joint: Secondary | ICD-10-CM

## 2020-12-23 DIAGNOSIS — Z634 Disappearance and death of family member: Secondary | ICD-10-CM

## 2020-12-23 MED ORDER — CYCLOBENZAPRINE HCL 5 MG PO TABS: 5.0000 mg | ORAL_TABLET | Freq: Every day | ORAL | 0 refills | Status: DC

## 2020-12-23 MED ORDER — METHYLPREDNISOLONE 4 MG PO TBPK: ORAL_TABLET | ORAL | 0 refills | Status: DC

## 2020-12-23 MED FILL — METHYLPREDNISOLONE 4 MG TAB: 4 | 6 days supply | Qty: 21 | Fill #0

## 2020-12-23 MED FILL — CYCLOBENZAPRINE 5 MG TABLET: 5 | 30 days supply | Qty: 30 | Fill #0

## 2020-12-23 NOTE — Progress Notes (Signed)
[place] [place], [place]  [address] Phone:  [phone]   Fax:  [phone]   Established Patient Office Visit  Subjective:  Patient ID: Daniel Haynes, male    DOB: 1951-09-05  Age: 70 y.o. MRN: [id number]  CC:  Chief Complaint  Patient presents with  . Hypertension    HPI Daniel Haynes presents for follow up. He  has a past medical history of Arthritis, Gout, Hyperlipidemia, Hypertension, and Insomnia (08/2019).   Shoulder Pain Patient complains of bilateral R>L shoulder pain and swelling. The symptoms began several months ago. Aggravating factors: no known event. Pain is located around the acromioclavicular [facility]) joint. Discomfort is described as tightness, aching, and swelling. Symptoms are exacerbated by lying on the shoulder. Evaluation to date: none. Therapy to date includes: rest and avoidance of offending activity. 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   He admits that his son who he was helping to care for died in 30-Oct-2023.  Past Medical History:  Diagnosis Date  . Arthritis    hands fingers wrists   . Gout   . Hyperlipidemia   . Hypertension   . Insomnia 08/2019    Past Surgical History:  Procedure Laterality Date  . CERVICAL DISCECTOMY  2004   [facility]    Family History  Problem Relation Age of Onset  . Cancer Mother   . Cancer Father   . Colon cancer Neg Hx   . Colon polyps Neg Hx   . Esophageal cancer Neg Hx   . Rectal cancer Neg Hx   . Stomach cancer Neg Hx     Social History   Socioeconomic History  . Marital status: Single    Spouse name: Not on file  . Number of children: Not on file  . Years of education: Not on file  . Highest education level: Not on file  Occupational History  . Not on file  Tobacco Use  . Smoking status: Former Research scientist (life sciences)  . Smokeless tobacco: Never Used  Substance and Sexual Activity  . Alcohol use: Yes    Alcohol/week: 21.0  standard drinks    Types: 21 Shots of liquor per week    Comment: 3 a day   . Drug use: Yes    Types: Marijuana    Comment: occ use   . Sexual activity: Not on file  Other Topics Concern  . Not on file  Social History Narrative  . Not on file   Social Determinants of Health   Financial Resource Strain: Not on file  Food Insecurity: Not on file  Transportation Needs: Not on file  Physical Activity: Not on file  Stress: Not on file  Social Connections: Not on file  Intimate Partner Violence: Not on file    Outpatient Medications Prior to Visit  Medication Sig Dispense Refill  . amLODipine (NORVASC) 10 MG tablet Take 1 tablet (10 mg total) by mouth daily. 90 tablet 3  . gabapentin (NEURONTIN) 300 MG capsule TAKE 1 CAPSULE (300 MG TOTAL) BY MOUTH 3 (THREE) TIMES DAILY. 90 capsule 2  . hydrochlorothiazide (HYDRODIURIL) 25 MG tablet Take 1 tablet (25 mg total) by mouth daily. 90 tablet 3  . ibuprofen (ADVIL) 800 MG tablet Take 1 tablet (800 mg total) by mouth every 8 (eight) hours as needed. 90 tablet 3  . lisinopril (ZESTRIL) 20 MG tablet Take 1 tablet (20 mg total) by mouth daily. 90 tablet 3  .  pravastatin (PRAVACHOL) 40 MG tablet Take 1 tablet (40 mg total) by mouth daily. 90 tablet 1  . solifenacin (VESICARE) 10 MG tablet Take 1 tablet (10 mg total) by mouth daily. 30 tablet 11  . traZODone (DESYREL) 50 MG tablet Take 1 tablet (50 mg total) by mouth at bedtime as needed for sleep. 90 tablet 3  . tamsulosin (FLOMAX) 0.4 MG CAPS capsule Take 1 capsule (0.4 mg total) by mouth daily after breakfast. 90 capsule 3   No facility-administered medications prior to visit.    No Known Allergies  ROS Review of Systems  Respiratory: Negative for shortness of breath.   Cardiovascular: Positive for leg swelling. Negative for chest pain.  Musculoskeletal: Positive for back pain, joint swelling (right > Left ) and myalgias (tightness).       Shoulders       Objective:    Physical  Exam Constitutional:      General: He is in acute distress.     Appearance: He is not ill-appearing, toxic-appearing or diaphoretic.  HENT:     Head: Normocephalic and atraumatic.  Cardiovascular:     Rate and Rhythm: Normal rate and regular rhythm.     Pulses: Normal pulses.     Heart sounds: Normal heart sounds.  Pulmonary:     Effort: Pulmonary effort is normal.     Breath sounds: Normal breath sounds.  Musculoskeletal:     Right shoulder: Swelling and tenderness present. No crepitus. Decreased range of motion. Normal strength.     Left shoulder: No tenderness. Decreased range of motion. Normal strength.     Cervical back: Normal range of motion.     Lumbar back: Tenderness present.  Skin:    General: Skin is warm and dry.     Capillary Refill: Capillary refill takes less than 2 seconds.  Neurological:     General: No focal deficit present.     Mental Status: He is alert and oriented to person, place, and time.  Psychiatric:        Mood and Affect: Mood normal.        Behavior: Behavior normal.        Thought Content: Thought content normal.        Judgment: Judgment normal.     BP (!) 149/79   Pulse 71   Ht 5\' 5"  (1.651 m)   Wt 166 lb (75.3 kg)   SpO2 100%   BMI 27.62 kg/m  Wt Readings from Last 3 Encounters:  12/23/20 166 lb (75.3 kg)  09/02/20 168 lb 3.2 oz (76.3 kg)  06/03/20 170 lb 6.4 oz (77.3 kg)     Health Maintenance Due  Topic Date Due  . COVID-19 Vaccine (3 - Booster) 04/05/2020    There are no preventive care reminders to display for this patient.  Lab Results  Component Value Date   TSH 0.744 08/25/2019   Lab Results  Component Value Date   WBC 5.9 06/03/2020   HGB 13.2 06/03/2020   HCT 37.8 06/03/2020   MCV 91 06/03/2020   PLT 265 06/03/2020   Lab Results  Component Value Date   NA 144 09/02/2020   K 4.0 09/02/2020   CO2 23 06/29/2017   GLUCOSE 92 09/02/2020   BUN 24 09/02/2020   CREATININE 1.45 (H) 09/02/2020   BILITOT 0.5  09/02/2020   ALKPHOS 85 09/02/2020   AST 18 09/02/2020   ALT 17 06/29/2017   PROT 7.2 09/02/2020   ALBUMIN 4.4 09/02/2020  CALCIUM 9.3 09/02/2020   ANIONGAP 13 12/30/2015   Lab Results  Component Value Date   CHOL 197 03/11/2020   Lab Results  Component Value Date   HDL 54 03/11/2020   Lab Results  Component Value Date   LDLCALC 116 (H) 03/11/2020   Lab Results  Component Value Date   TRIG 155 (H) 03/11/2020   Lab Results  Component Value Date   CHOLHDL 3.6 03/11/2020   Lab Results  Component Value Date   HGBA1C 5.1 08/25/2019      Assessment & Plan:   Problem List Items Addressed This Visit      Other   Gout Screening to increased pain   Relevant Orders   Arthritis Panel   Uric Acid    Other Visit Diagnoses    Muscle pain    -  Primary Trial Cyclobenzaprine 5 mg qhs prn    Relevant Orders   Comp. Metabolic Panel (12)   Vitamin B12   Magnesium   Chronic right shoulder pain     Worsening  Xray Medrol dosepak Exercise trial once pain improves if not effective will trial PT and or referral to orthopedics.  Declined the need for work excuse.    Relevant Medications   methylPREDNISolone (MEDROL) 4 MG TBPK tablet   cyclobenzaprine (FLEXERIL) 5 MG tablet   Other Relevant Orders   DG Shoulder Right   Acute pain of left shoulder       Relevant Orders   DG Shoulder Left   Joint swelling       Relevant Orders   Arthritis Panel   Encounter for vitamin deficiency screening       Relevant Orders   VITAMIN D 25 Hydroxy (Vit-D Deficiency, Fractures)   Screening for deficiency anemia       Relevant Orders   CBC with Differential/Platelet  Grief at loss of child Continue to encourage spiritual beliefs and family    Meds ordered this encounter  Medications  . methylPREDNISolone (MEDROL) 4 MG TBPK tablet    Sig: Follow package instructions.    Dispense:  21 tablet    Refill:  0    Do not add to the "Automatic Refill" notification system.    Order  Specific Question:   Supervising Provider    Answer:   Tresa Garter [id number]  . cyclobenzaprine (FLEXERIL) 5 MG tablet    Sig: Take 1 tablet (5 mg total) by mouth at bedtime.    Dispense:  30 tablet    Refill:  0    Order Specific Question:   Supervising Provider    Answer:   Tresa Garter [id number]    Follow-up: Return in about 6 weeks (around 02/03/2021) for Follow up HTN [id number].    Vevelyn Francois, NP

## 2020-12-23 NOTE — Patient Instructions (Signed)
Shoulder Exercises Ask your health care provider which exercises are safe for you. Do exercises exactly as told by your health care provider and adjust them as directed. It is normal to feel mild stretching, pulling, tightness, or discomfort as you do these exercises. Stop right away if you feel sudden pain or your pain gets worse. Do not begin these exercises until told by your health care provider. Stretching exercises External rotation and abduction This exercise is sometimes called corner stretch. This exercise rotates your arm outward (external rotation) and moves your arm out from your body (abduction). 1. Stand in a doorway with one of your feet slightly in front of the other. This is called a staggered stance. If you cannot reach your forearms to the door frame, stand facing a corner of a room. 2. Choose one of the following positions as told by your health care provider: ? Place your hands and forearms on the door frame above your head. ? Place your hands and forearms on the door frame at the height of your head. ? Place your hands on the door frame at the height of your elbows. 3. Slowly move your weight onto your front foot until you feel a stretch across your chest and in the front of your shoulders. Keep your head and chest upright and keep your abdominal muscles tight. 4. Hold for __________ seconds. 5. To release the stretch, shift your weight to your back foot. Repeat __________ times. Complete this exercise __________ times a day.   Extension, standing 1. Stand and hold a broomstick, a cane, or a similar object behind your back. ? Your hands should be a little wider than shoulder width apart. ? Your palms should face away from your back. 2. Keeping your elbows straight and your shoulder muscles relaxed, move the stick away from your body until you feel a stretch in your shoulders (extension). ? Avoid shrugging your shoulders while you move the stick. Keep your shoulder blades  tucked down toward the middle of your back. 3. Hold for __________ seconds. 4. Slowly return to the starting position. Repeat __________ times. Complete this exercise __________ times a day. Range-of-motion exercises Pendulum 1. Stand near a wall or a surface that you can hold onto for balance. 2. Bend at the waist and let your left / right arm hang straight down. Use your other arm to support you. Keep your back straight and do not lock your knees. 3. Relax your left / right arm and shoulder muscles, and move your hips and your trunk so your left / right arm swings freely. Your arm should swing because of the motion of your body, not because you are using your arm or shoulder muscles. 4. Keep moving your hips and trunk so your arm swings in the following directions, as told by your health care provider: ? Side to side. ? Forward and backward. ? In clockwise and counterclockwise circles. 5. Continue each motion for __________ seconds, or for as long as told by your health care provider. 6. Slowly return to the starting position. Repeat __________ times. Complete this exercise __________ times a day.   Shoulder flexion, standing 1. Stand and hold a broomstick, a cane, or a similar object. Place your hands a little more than shoulder width apart on the object. Your left / right hand should be palm up, and your other hand should be palm down. 2. Keep your elbow straight and your shoulder muscles relaxed. Push the stick up with your healthy   arm to raise your left / right arm in front of your body, and then over your head until you feel a stretch in your shoulder (flexion). ? Avoid shrugging your shoulder while you raise your arm. Keep your shoulder blade tucked down toward the middle of your back. 3. Hold for __________ seconds. 4. Slowly return to the starting position. Repeat __________ times. Complete this exercise __________ times a day.   Shoulder abduction, standing 1. Stand and hold a  broomstick, a cane, or a similar object. Place your hands a little more than shoulder width apart on the object. Your left / right hand should be palm up, and your other hand should be palm down. 2. Keep your elbow straight and your shoulder muscles relaxed. Push the object across your body toward your left / right side. Raise your left / right arm to the side of your body (abduction) until you feel a stretch in your shoulder. ? Do not raise your arm above shoulder height unless your health care provider tells you to do that. ? If directed, raise your arm over your head. ? Avoid shrugging your shoulder while you raise your arm. Keep your shoulder blade tucked down toward the middle of your back. 3. Hold for __________ seconds. 4. Slowly return to the starting position. Repeat __________ times. Complete this exercise __________ times a day. Internal rotation 1. Place your left / right hand behind your back, palm up. 2. Use your other hand to dangle an exercise band, a towel, or a similar object over your shoulder. Grasp the band with your left / right hand so you are holding on to both ends. 3. Gently pull up on the band until you feel a stretch in the front of your left / right shoulder. The movement of your arm toward the center of your body is called internal rotation. ? Avoid shrugging your shoulder while you raise your arm. Keep your shoulder blade tucked down toward the middle of your back. 4. Hold for __________ seconds. 5. Release the stretch by letting go of the band and lowering your hands. Repeat __________ times. Complete this exercise __________ times a day.   Strengthening exercises External rotation 1. Sit in a stable chair without armrests. 2. Secure an exercise band to a stable object at elbow height on your left / right side. 3. Place a soft object, such as a folded towel or a small pillow, between your left / right upper arm and your body to move your elbow about 4 inches (10  cm) away from your side. 4. Hold the end of the exercise band so it is tight and there is no slack. 5. Keeping your elbow pressed against the soft object, slowly move your forearm out, away from your abdomen (external rotation). Keep your body steady so only your forearm moves. 6. Hold for __________ seconds. 7. Slowly return to the starting position. Repeat __________ times. Complete this exercise __________ times a day.   Shoulder abduction 1. Sit in a stable chair without armrests, or stand up. 2. Hold a __________ weight in your left / right hand, or hold an exercise band with both hands. 3. Start with your arms straight down and your left / right palm facing in, toward your body. 4. Slowly lift your left / right hand out to your side (abduction). Do not lift your hand above shoulder height unless your health care provider tells you that this is safe. ? Keep your arms straight. ? Avoid   shrugging your shoulder while you do this movement. Keep your shoulder blade tucked down toward the middle of your back. 5. Hold for __________ seconds. 6. Slowly lower your arm, and return to the starting position. Repeat __________ times. Complete this exercise __________ times a day.   Shoulder extension 1. Sit in a stable chair without armrests, or stand up. 2. Secure an exercise band to a stable object in front of you so it is at shoulder height. 3. Hold one end of the exercise band in each hand. Your palms should face each other. 4. Straighten your elbows and lift your hands up to shoulder height. 5. Step back, away from the secured end of the exercise band, until the band is tight and there is no slack. 6. Squeeze your shoulder blades together as you pull your hands down to the sides of your thighs (extension). Stop when your hands are straight down by your sides. Do not let your hands go behind your body. 7. Hold for __________ seconds. 8. Slowly return to the starting position. Repeat __________  times. Complete this exercise __________ times a day. Shoulder row 1. Sit in a stable chair without armrests, or stand up. 2. Secure an exercise band to a stable object in front of you so it is at waist height. 3. Hold one end of the exercise band in each hand. Position your palms so that your thumbs are facing the ceiling (neutral position). 4. Bend each of your elbows to a 90-degree angle (right angle) and keep your upper arms at your sides. 5. Step back until the band is tight and there is no slack. 6. Slowly pull your elbows back behind you. 7. Hold for __________ seconds. 8. Slowly return to the starting position. Repeat __________ times. Complete this exercise __________ times a day. Shoulder press-ups 1. Sit in a stable chair that has armrests. Sit upright, with your feet flat on the floor. 2. Put your hands on the armrests so your elbows are bent and your fingers are pointing forward. Your hands should be about even with the sides of your body. 3. Push down on the armrests and use your arms to lift yourself off the chair. Straighten your elbows and lift yourself up as much as you comfortably can. ? Move your shoulder blades down, and avoid letting your shoulders move up toward your ears. ? Keep your feet on the ground. As you get stronger, your feet should support less of your body weight as you lift yourself up. 4. Hold for __________ seconds. 5. Slowly lower yourself back into the chair. Repeat __________ times. Complete this exercise __________ times a day.   Wall push-ups 1. Stand so you are facing a stable wall. Your feet should be about one arm-length away from the wall. 2. Lean forward and place your palms on the wall at shoulder height. 3. Keep your feet flat on the floor as you bend your elbows and lean forward toward the wall. 4. Hold for __________ seconds. 5. Straighten your elbows to push yourself back to the starting position. Repeat __________ times. Complete this  exercise __________ times a day.   This information is not intended to replace advice given to you by your health care provider. Make sure you discuss any questions you have with your health care provider. Document Revised: 01/27/2019 Document Reviewed: 11/04/2018 Elsevier Patient Education  2021 [facility] Ask your health care provider which exercises are safe for  you. Do exercises exactly as told by your health care provider and adjust them as directed. It is normal to feel mild stretching, pulling, tightness, or discomfort as you do these exercises. Stop right away if you feel sudden pain or your pain gets worse. Do not begin these exercises until told by your health care provider. Stretching and range-of-motion exercises These exercises warm up your muscles and joints and improve the movement and flexibility of your back. These exercises also help to relieve pain, numbness, and tingling. Lumbar rotation 1. Lie on your back on a firm surface and bend your knees. 2. Straighten your arms out to your sides so each arm forms a 90-degree angle (right angle) with a side of your body. 3. Slowly move (rotate) both of your knees to one side of your body until you feel a stretch in your lower back (lumbar). Try not to let your shoulders lift off the floor. 4. Hold this position for __________ seconds. 5. Tense your abdominal muscles and slowly move your knees back to the starting position. 6. Repeat this exercise on the other side of your body. Repeat __________ times. Complete this exercise __________ times a day.   Single knee to chest 1. Lie on your back on a firm surface with both legs straight. 2. Bend one of your knees. Use your hands to move your knee up toward your chest until you feel a gentle stretch in your lower back and buttock. ? Hold your leg in this position by holding on to the front of your knee. ? Keep your other leg as straight as  possible. 3. Hold this position for __________ seconds. 4. Slowly return to the starting position. 5. Repeat with your other leg. Repeat __________ times. Complete this exercise __________ times a day.   Prone extension on elbows 1. Lie on your abdomen on a firm surface (prone position). 2. Prop yourself up on your elbows. 3. Use your arms to help lift your chest up until you feel a gentle stretch in your abdomen and your lower back. ? This will place some of your body weight on your elbows. If this is uncomfortable, try stacking pillows under your chest. ? Your hips should stay down, against the surface that you are lying on. Keep your hip and back muscles relaxed. 4. Hold this position for __________ seconds. 5. Slowly relax your upper body and return to the starting position. Repeat __________ times. Complete this exercise __________ times a day.   Strengthening exercises These exercises build strength and endurance in your back. Endurance is the ability to use your muscles for a long time, even after they get tired. Pelvic tilt This exercise strengthens the muscles that lie deep in the abdomen. 1. Lie on your back on a firm surface. Bend your knees and keep your feet flat on the floor. 2. Tense your abdominal muscles. Tip your pelvis up toward the ceiling and flatten your lower back into the floor. ? To help with this exercise, you may place a small towel under your lower back and try to push your back into the towel. 3. Hold this position for __________ seconds. 4. Let your muscles relax completely before you repeat this exercise. Repeat __________ times. Complete this exercise __________ times a day. Alternating arm and leg raises 1. Get on your hands and knees on a firm surface. If you are on a hard floor, you may want to use padding, such as an exercise mat, to cushion your knees.  2. Line up your arms and legs. Your hands should be directly below your shoulders, and your knees  should be directly below your hips. 3. Lift your left leg behind you. At the same time, raise your right arm and straighten it in front of you. ? Do not lift your leg higher than your hip. ? Do not lift your arm higher than your shoulder. ? Keep your abdominal and back muscles tight. ? Keep your hips facing the ground. ? Do not arch your back. ? Keep your balance carefully, and do not hold your breath. 4. Hold this position for __________ seconds. 5. Slowly return to the starting position. 6. Repeat with your right leg and your left arm. Repeat __________ times. Complete this exercise __________ times a day.   Abdominal set with straight leg raise 1. Lie on your back on a firm surface. 2. Bend one of your knees and keep your other leg straight. 3. Tense your abdominal muscles and lift your straight leg up, 4-6 inches (10-15 cm) off the ground. 4. Keep your abdominal muscles tight and hold this position for __________ seconds. ? Do not hold your breath. ? Do not arch your back. Keep it flat against the ground. 5. Keep your abdominal muscles tense as you slowly lower your leg back to the starting position. 6. Repeat with your other leg. Repeat __________ times. Complete this exercise __________ times a day.   Single leg lower with bent knees 1. Lie on your back on a firm surface. 2. Tense your abdominal muscles and lift your feet off the floor, one foot at a time, so your knees and hips are bent in 90-degree angles (right angles). ? Your knees should be over your hips and your lower legs should be parallel to the floor. 3. Keeping your abdominal muscles tense and your knee bent, slowly lower one of your legs so your toe touches the ground. 4. Lift your leg back up to return to the starting position. ? Do not hold your breath. ? Do not let your back arch. Keep your back flat against the ground. 5. Repeat with your other leg. Repeat __________ times. Complete this exercise __________ times  a day. Posture and body mechanics Good posture and healthy body mechanics can help to relieve stress in your body's tissues and joints. Body mechanics refers to the movements and positions of your body while you do your daily activities. Posture is part of body mechanics. Good posture means:  Your spine is in its natural S-curve position (neutral).  Your shoulders are pulled back slightly.  Your head is not tipped forward. Follow these guidelines to improve your posture and body mechanics in your everyday activities. Standing  When standing, keep your spine neutral and your feet about hip width apart. Keep a slight bend in your knees. Your ears, shoulders, and hips should line up.  When you do a task in which you stand in one place for a long time, place one foot up on a stable object that is 2-4 inches (5-10 cm) high, such as a footstool. This helps keep your spine neutral.   Sitting  When sitting, keep your spine neutral and keep your feet flat on the floor. Use a footrest, if necessary, and keep your thighs parallel to the floor. Avoid rounding your shoulders, and avoid tilting your head forward.  When working at a desk or a computer, keep your desk at a height where your hands are slightly lower than your  elbows. Slide your chair under your desk so you are close enough to maintain good posture.  When working at a computer, place your monitor at a height where you are looking straight ahead and you do not have to tilt your head forward or downward to look at the screen.   Resting  When lying down and resting, avoid positions that are most painful for you.  If you have pain with activities such as sitting, bending, stooping, or squatting, lie in a position in which your body does not bend very much. For example, avoid curling up on your side with your arms and knees near your chest (fetal position).  If you have pain with activities such as standing for a long time or reaching with your  arms, lie with your spine in a neutral position and bend your knees slightly. Try the following positions: ? Lying on your side with a pillow between your knees. ? Lying on your back with a pillow under your knees. Lifting  When lifting objects, keep your feet at least shoulder width apart and tighten your abdominal muscles.  Bend your knees and hips and keep your spine neutral. It is important to lift using the strength of your legs, not your back. Do not lock your knees straight out.  Always ask for help to lift heavy or awkward objects.   This information is not intended to replace advice given to you by your health care provider. Make sure you discuss any questions you have with your health care provider. Document Revised: 01/27/2019 Document Reviewed: 10/27/2018 Elsevier Patient Education  [place].

## 2020-12-24 LAB — COMP. METABOLIC PANEL (12)
AST: 14 IU/L (ref 0–40)
Albumin/Globulin Ratio: 1.5 (ref 1.2–2.2)
Albumin: 4.4 g/dL (ref 3.8–4.8)
Alkaline Phosphatase: 122 IU/L — ABNORMAL HIGH (ref 44–121)
BUN/Creatinine Ratio: 16 (ref 10–24)
BUN: 28 mg/dL — ABNORMAL HIGH (ref 8–27)
Bilirubin Total: 0.2 mg/dL (ref 0.0–1.2)
Calcium: 9.7 mg/dL (ref 8.6–10.2)
Chloride: 103 mmol/L (ref 96–106)
Creatinine, Ser: 1.7 mg/dL — ABNORMAL HIGH (ref 0.76–1.27)
Globulin, Total: 2.9 g/dL (ref 1.5–4.5)
Glucose: 114 mg/dL — ABNORMAL HIGH (ref 65–99)
Potassium: 4.8 mmol/L (ref 3.5–5.2)
Sodium: 146 mmol/L — ABNORMAL HIGH (ref 134–144)
Total Protein: 7.3 g/dL (ref 6.0–8.5)
eGFR: 43 mL/min/{1.73_m2} — ABNORMAL LOW (ref >59)

## 2020-12-24 LAB — CBC WITH DIFFERENTIAL/PLATELET
Basophils Absolute: 0 10*3/uL (ref 0.0–0.2)
Basos: 0 %
EOS (ABSOLUTE): 0.2 10*3/uL (ref 0.0–0.4)
Eos: 2 %
Hematocrit: 35.8 % — ABNORMAL LOW (ref 37.5–51.0)
Hemoglobin: 12.4 g/dL — ABNORMAL LOW (ref 13.0–17.7)
Immature Grans (Abs): 0 10*3/uL (ref 0.0–0.1)
Immature Granulocytes: 0 %
Lymphocytes Absolute: 1.5 10*3/uL (ref 0.7–3.1)
Lymphs: 22 %
MCH: 30.4 pg (ref 26.6–33.0)
MCHC: 34.6 g/dL (ref 31.5–35.7)
MCV: 88 fL (ref 79–97)
Monocytes Absolute: 0.8 10*3/uL (ref 0.1–0.9)
Monocytes: 12 %
Neutrophils Absolute: 4.2 10*3/uL (ref 1.4–7.0)
Neutrophils: 64 %
Platelets: 340 10*3/uL (ref 150–450)
RBC: 4.08 x10E6/uL — ABNORMAL LOW (ref 4.14–5.80)
RDW: 11.8 % (ref 11.6–15.4)
WBC: 6.8 10*3/uL (ref 3.4–10.8)

## 2020-12-24 LAB — VITAMIN B12: Vitamin B-12: 267 pg/mL (ref 232–1245)

## 2020-12-24 LAB — ARTHRITIS PANEL
Anti Nuclear Antibody (ANA): NEGATIVE
Rheumatoid fact SerPl-aCnc: <10 IU/mL (ref <14.0)
Sed Rate: 52 mm/hr — ABNORMAL HIGH (ref 0–30)
Uric Acid: 8.2 mg/dL (ref 3.8–8.4)

## 2020-12-24 LAB — MAGNESIUM: Magnesium: 2.2 mg/dL (ref 1.6–2.3)

## 2020-12-24 LAB — VITAMIN D 25 HYDROXY (VIT D DEFICIENCY, FRACTURES): Vit D, 25-Hydroxy: 44.7 ng/mL (ref 30.0–100.0)

## 2020-12-26 ENCOUNTER — Ambulatory Visit: Payer: Medicare PPO | Admitting: Nurse Practitioner

## 2020-12-26 MED FILL — HYDROCHLOROTHIAZIDE 25 MG T: 25 | 30 days supply | Qty: 30 | Fill #3

## 2020-12-26 MED FILL — LISINOPRIL 20 MG TABLET: 20 | 30 days supply | Qty: 30 | Fill #3

## 2020-12-26 MED FILL — TAMSULOSIN HCL 0.4 MG CAP: 0.4 | 30 days supply | Qty: 30 | Fill #3

## 2020-12-27 ENCOUNTER — Telehealth: Payer: Self-pay

## 2020-12-27 NOTE — Telephone Encounter (Signed)
Attempted to reach patient regarding results and recommendations but no answer or voicemail.

## 2020-12-27 NOTE — Telephone Encounter (Signed)
-----   Message from Vevelyn Francois, NP sent at 12/25/2020  2:00 PM EST ----- Please make Daniel Haynes  That his labs are stable he does have inflammation in his joints but not indicate gout He does have inflammation in the shoulder which the left looks worse than the right but I think the right hurts worse. The steroid should help decrease the inflammation.  He can then start the exercises.  If the pain continues please make me aware so that we can get him in with orthopedist for further evaluation.  Thank you

## 2021-01-10 ENCOUNTER — Telehealth: Payer: Self-pay

## 2021-01-10 NOTE — Telephone Encounter (Signed)
Med refill on shoulder medicine that was given the last time.

## 2021-01-10 NOTE — Telephone Encounter (Signed)
Called and lvm for patient to call us back. 

## 2021-01-18 ENCOUNTER — Other Ambulatory Visit: Payer: Self-pay

## 2021-01-21 ENCOUNTER — Other Ambulatory Visit: Payer: Self-pay | Admitting: Nurse Practitioner

## 2021-01-21 DIAGNOSIS — M5431 Sciatica, right side: Secondary | ICD-10-CM

## 2021-01-21 MED FILL — Solifenacin Succinate Tab 10 MG: ORAL | 30 days supply | Qty: 30 | Fill #0 | Status: AC

## 2021-01-21 MED FILL — Amlodipine Besylate Tab 10 MG (Base Equivalent): ORAL | 30 days supply | Qty: 30 | Fill #0 | Status: AC

## 2021-01-22 ENCOUNTER — Other Ambulatory Visit: Payer: Self-pay

## 2021-01-22 MED ORDER — GABAPENTIN 300 MG PO CAPS
ORAL_CAPSULE | Freq: Three times a day (TID) | ORAL | 2 refills | Status: DC
  Filled 2021-01-22: qty 90, 30d supply, fill #0
  Filled 2021-03-10: qty 90, 30d supply, fill #1
  Filled 2021-04-14: qty 90, 30d supply, fill #2

## 2021-01-23 ENCOUNTER — Telehealth: Payer: Self-pay

## 2021-01-23 ENCOUNTER — Other Ambulatory Visit: Payer: Self-pay | Admitting: Nurse Practitioner

## 2021-01-23 ENCOUNTER — Other Ambulatory Visit: Payer: Self-pay

## 2021-01-23 MED ORDER — CYCLOBENZAPRINE HCL 5 MG PO TABS
ORAL_TABLET | Freq: Every day | ORAL | 0 refills | Status: DC
  Filled 2021-01-23: qty 30, 30d supply, fill #0

## 2021-01-23 NOTE — Telephone Encounter (Signed)
Refill sent.

## 2021-01-23 NOTE — Telephone Encounter (Signed)
muscle relaxer for his pain

## 2021-01-24 ENCOUNTER — Other Ambulatory Visit: Payer: Self-pay

## 2021-01-30 MED FILL — Tamsulosin HCl Cap 0.4 MG: ORAL | Qty: 90 | Fill #0 | Status: CN

## 2021-01-30 MED FILL — Lisinopril Tab 20 MG: ORAL | Qty: 90 | Fill #0 | Status: CN

## 2021-01-30 MED FILL — Hydrochlorothiazide Tab 25 MG: ORAL | Qty: 90 | Fill #0 | Status: CN

## 2021-02-03 ENCOUNTER — Other Ambulatory Visit: Payer: Self-pay

## 2021-02-03 ENCOUNTER — Encounter: Payer: Self-pay | Admitting: Nurse Practitioner

## 2021-02-03 ENCOUNTER — Ambulatory Visit: Payer: Medicare PPO | Admitting: Nurse Practitioner

## 2021-02-03 VITALS — BP 121/70 | HR 67 | Temp 97.0°F | Ht 65.0 in | Wt 164.0 lb

## 2021-02-03 DIAGNOSIS — I10 Essential (primary) hypertension: Secondary | ICD-10-CM

## 2021-02-03 DIAGNOSIS — M79641 Pain in right hand: Secondary | ICD-10-CM

## 2021-02-03 DIAGNOSIS — G8929 Other chronic pain: Secondary | ICD-10-CM

## 2021-02-03 DIAGNOSIS — M5431 Sciatica, right side: Secondary | ICD-10-CM | POA: Diagnosis not present

## 2021-02-03 DIAGNOSIS — M25512 Pain in left shoulder: Secondary | ICD-10-CM

## 2021-02-03 DIAGNOSIS — M79642 Pain in left hand: Secondary | ICD-10-CM | POA: Diagnosis not present

## 2021-02-03 DIAGNOSIS — M25511 Pain in right shoulder: Secondary | ICD-10-CM | POA: Diagnosis not present

## 2021-02-03 DIAGNOSIS — N1831 Chronic kidney disease, stage 3a: Secondary | ICD-10-CM | POA: Diagnosis not present

## 2021-02-03 MED ORDER — COLCHICINE 0.6 MG PO TABS
ORAL_TABLET | ORAL | 1 refills | Status: DC
  Filled 2021-02-03: qty 30, 30d supply, fill #0

## 2021-02-03 MED ORDER — HYDROCHLOROTHIAZIDE 25 MG PO TABS
ORAL_TABLET | Freq: Every day | ORAL | 3 refills | Status: DC
  Filled 2021-02-03: qty 30, 30d supply, fill #0
  Filled 2021-03-10: qty 30, 30d supply, fill #1
  Filled 2021-04-14: qty 30, 30d supply, fill #2
  Filled 2021-05-26: qty 30, 30d supply, fill #3
  Filled 2021-06-23: qty 30, 30d supply, fill #4

## 2021-02-03 MED ORDER — AMLODIPINE BESYLATE 10 MG PO TABS
ORAL_TABLET | Freq: Every day | ORAL | 3 refills | Status: DC
  Filled 2021-02-03: qty 90, fill #0
  Filled 2021-02-26: qty 30, 30d supply, fill #0
  Filled 2021-04-09: qty 30, 30d supply, fill #1

## 2021-02-03 MED ORDER — METHOCARBAMOL 750 MG PO TABS
750.0000 mg | ORAL_TABLET | Freq: Three times a day (TID) | ORAL | 5 refills | Status: AC | PRN
  Filled 2021-02-03: qty 30, 10d supply, fill #0
  Filled 2021-02-17 – 2021-02-25 (×2): qty 30, 10d supply, fill #1
  Filled 2021-03-24: qty 30, 10d supply, fill #2
  Filled 2021-04-14: qty 30, 10d supply, fill #3
  Filled 2021-05-19: qty 30, 10d supply, fill #4

## 2021-02-03 MED ORDER — LISINOPRIL 20 MG PO TABS
ORAL_TABLET | Freq: Every day | ORAL | 3 refills | Status: DC
  Filled 2021-02-03: qty 30, 30d supply, fill #0
  Filled 2021-03-10: qty 30, 30d supply, fill #1
  Filled 2021-04-14: qty 30, 30d supply, fill #2
  Filled 2021-05-26: qty 30, 30d supply, fill #3

## 2021-02-03 MED ORDER — PRAVASTATIN SODIUM 40 MG PO TABS
ORAL_TABLET | Freq: Every day | ORAL | 3 refills | Status: DC
  Filled 2021-02-03: qty 30, 30d supply, fill #0
  Filled 2021-03-10: qty 30, 30d supply, fill #1
  Filled 2021-04-14: qty 30, 30d supply, fill #2
  Filled 2021-05-26: qty 30, 30d supply, fill #3

## 2021-02-03 NOTE — Patient Instructions (Signed)
Gout  Gout is painful swelling of your joints. Gout is a type of arthritis. It is caused by having too much uric acid in your body. Uric acid is a chemical that is made when your body breaks down substances called purines. If your body has too much uric acid, sharp crystals can form and build up in your joints. This causes pain and swelling. Gout attacks can happen quickly and be very painful (acute gout). Over time, the attacks can affect more joints and happen more often (chronic gout). What are the causes?  Too much uric acid in your blood. This can happen because: ? Your kidneys do not remove enough uric acid from your blood. ? Your body makes too much uric acid. ? You eat too many foods that are high in purines. These foods include organ meats, some seafood, and beer.  Trauma or stress. What increases the risk?  Having a family history of gout.  Being male and middle-aged.  Being male and having gone through menopause.  Being very overweight (obese).  Drinking alcohol, especially beer.  Not having enough water in the body (being dehydrated).  Losing weight too quickly.  Having an organ transplant.  Having lead poisoning.  Taking certain medicines.  Having kidney disease.  Having a skin condition called psoriasis. What are the signs or symptoms? An attack of acute gout usually happens in just one joint. The most common place is the big toe. Attacks often start at night. Other joints that may be affected include joints of the feet, ankle, knee, fingers, wrist, or elbow. Symptoms of an attack may include:  Very bad pain.  Warmth.  Swelling.  Stiffness.  Shiny, red, or purple skin.  Tenderness. The affected joint may be very painful to touch.  Chills and fever. Chronic gout may cause symptoms more often. More joints may be involved. You may also have white or yellow lumps (tophi) on your hands or feet or in other areas near your joints.   How is this  treated?  Treatment for this condition has two phases: treating an acute attack and preventing future attacks.  Acute gout treatment may include: ? NSAIDs. ? Steroids. These are taken by mouth or injected into a joint. ? Colchicine. This medicine relieves pain and swelling. It can be given by mouth or through an IV tube.  Preventive treatment may include: ? Taking small doses of NSAIDs or colchicine daily. ? Using a medicine that reduces uric acid levels in your blood. ? Making changes to your diet. You may need to see a food expert (dietitian) about what to eat and drink to prevent gout. Follow these instructions at home: During a gout attack  If told, put ice on the painful area: ? Put ice in a plastic bag. ? Place a towel between your skin and the bag. ? Leave the ice on for 20 minutes, 2-3 times a day.  Raise (elevate) the painful joint above the level of your heart as often as you can.  Rest the joint as much as possible. If the joint is in your leg, you may be given crutches.  Follow instructions from your doctor about what you cannot eat or drink.   Avoiding future gout attacks  Eat a low-purine diet. Avoid foods and drinks such as: ? Liver. ? Kidney. ? Anchovies. ? Asparagus. ? Herring. ? Mushrooms. ? Mussels. ? Beer.  Stay at a healthy weight. If you want to lose weight, talk with your doctor. Do   not lose weight too fast.  Start or continue an exercise plan as told by your doctor. Eating and drinking  Drink enough fluids to keep your pee (urine) pale yellow.  If you drink alcohol: ? Limit how much you use to:  0-1 drink a day for women.  0-2 drinks a day for men. ? Be aware of how much alcohol is in your drink. In the [place] one drink equals one 12 oz bottle of beer (355 mL), one 5 oz glass of wine (148 mL), or one 1 oz glass of hard liquor (44 mL). General instructions  Take over-the-counter and prescription medicines only as told by your doctor.  Do  not drive or use heavy machinery while taking prescription pain medicine.  Return to your normal activities as told by your doctor. Ask your doctor what activities are safe for you.  Keep all follow-up visits as told by your doctor. This is important. Contact a doctor if:  You have another gout attack.  You still have symptoms of a gout attack after 10 days of treatment.  You have problems (side effects) because of your medicines.  You have chills or a fever.  You have burning pain when you pee (urinate).  You have pain in your lower back or belly. Get help right away if:  You have very bad pain.  Your pain cannot be controlled.  You cannot pee. Summary  Gout is painful swelling of the joints.  The most common site of pain is the big toe, but it can affect other joints.  Medicines and avoiding some foods can help to prevent and treat gout attacks. This information is not intended to replace advice given to you by your health care provider. Make sure you discuss any questions you have with your health care provider. Document Revised: 04/27/2018 Document Reviewed: 04/27/2018 Elsevier Patient Education  2021 Elsevier Inc.  

## 2021-02-03 NOTE — Progress Notes (Signed)
Daniel Haynes, [place]  [address] Phone:  [phone]   Fax:  [phone]   Established Patient Office Visit  Subjective:  Patient ID: Daniel Haynes, male    DOB: [birth year]  Age: 70 y.o. MRN: [id number]  CC:  Chief Complaint  Patient presents with  . Shoulder Pain    Pain in both shoulder and hands and taking otc medication not helping, having swelling     HPI Daniel Haynes presents for hand pain. He  has a past medical history of Arthritis, Gout, Hyperlipidemia, Hypertension, and Insomnia (08/2019).   Gout Patient here for evaluation of acute gouty arthritis. The patient reports onset of an acute gout attack involving the hands beginning 1 week, and being treated with OTC NSAIDs. Attacks occur primarily in the fingers. Patient reports his chronic pain is worse, his joint stiffness is worse and his joint swelling is worse. 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 The patient is avoiding high purine foods and reports consuming 0 alcoholic drinks. He continues to have shoulder pain with the inability to lift his right arm above shoulder level. He had X-rays  Mild-to-moderate osteoarthrosis. Left greater tuberosity irregularity is suspicious for supraspinatus tendinopathy.  He was taking flexeril. He admits that the had taken a tablet from his family member. He was not sure if this was effective.    Denies headache, dizziness, visual changes, shortness of breath, dyspnea on exertion, chest pain, nausea or vomiting .  Past Medical History:  Diagnosis Date  . Arthritis    hands fingers wrists   . Gout   . Hyperlipidemia   . Hypertension   . Insomnia 08/2019    Past Surgical History:  Procedure Laterality Date  . CERVICAL DISCECTOMY  2004   [facility]    Family History  Problem Relation Age of Onset  . Cancer Mother   . Cancer Father   . Colon cancer Neg Hx   .  Colon polyps Neg Hx   . Esophageal cancer Neg Hx   . Rectal cancer Neg Hx   . Stomach cancer Neg Hx     Social History   Socioeconomic History  . Marital status: Single    Spouse name: Not on file  . Number of children: Not on file  . Years of education: Not on file  . Highest education level: Not on file  Occupational History  . Not on file  Tobacco Use  . Smoking status: Former Research scientist (life sciences)  . Smokeless tobacco: Never Used  Substance and Sexual Activity  . Alcohol use: Yes    Alcohol/week: 21.0 standard drinks    Types: 21 Shots of liquor per week    Comment: 3 a day   . Drug use: Yes    Types: Marijuana    Comment: occ use   . Sexual activity: Not on file  Other Topics Concern  . Not on file  Social History Narrative  . Not on file   Social Determinants of Health   Financial Resource Strain: Not on file  Food Insecurity: Not on file  Transportation Needs: Not on file  Physical Activity: Not on file  Stress: Not on file  Social Connections: Not on file  Intimate Partner Violence: Not on file    Outpatient Medications Prior to Visit  Medication Sig Dispense Refill  . gabapentin (NEURONTIN) 300 MG capsule TAKE 1 CAPSULE (300 MG TOTAL)  BY MOUTH 3 (THREE) TIMES DAILY. 90 capsule 2  . solifenacin (VESICARE) 10 MG tablet TAKE 1 TABLET (10 MG TOTAL) BY MOUTH DAILY. 30 tablet 11  . traZODone (DESYREL) 50 MG tablet TAKE 1 TABLET (50 MG TOTAL) BY MOUTH AT BEDTIME AS NEEDED FOR SLEEP. 90 tablet 3  . amLODipine (NORVASC) 10 MG tablet TAKE 1 TABLET (10 MG TOTAL) BY MOUTH DAILY. 90 tablet 3  . cyclobenzaprine (FLEXERIL) 5 MG tablet TAKE 1 TABLET (5 MG TOTAL) BY MOUTH AT BEDTIME. 30 tablet 0  . hydrochlorothiazide (HYDRODIURIL) 25 MG tablet TAKE 1 TABLET (25 MG TOTAL) BY MOUTH DAILY. 90 tablet 3  . ibuprofen (ADVIL) 800 MG tablet Take 1 tablet (800 mg total) by mouth every 8 (eight) hours as needed. 90 tablet 3  . lisinopril (ZESTRIL) 20 MG tablet TAKE 1 TABLET (20 MG TOTAL) BY  MOUTH DAILY. 90 tablet 3  . pravastatin (PRAVACHOL) 40 MG tablet TAKE 1 TABLET (40 MG TOTAL) BY MOUTH DAILY. 90 tablet 1  . tamsulosin (FLOMAX) 0.4 MG CAPS capsule TAKE 1 CAPSULE (0.4 MG TOTAL) BY MOUTH DAILY AFTER BREAKFAST. (Patient not taking: Reported on 02/03/2021) 90 capsule 3   No facility-administered medications prior to visit.    No Known Allergies  ROS Review of Systems    Objective:    Physical Exam Constitutional:      Appearance: He is not ill-appearing, toxic-appearing or diaphoretic.  HENT:     Head: Normocephalic and atraumatic.     Nose: Nose normal.     Mouth/Throat:     Mouth: Mucous membranes are moist.  Cardiovascular:     Rate and Rhythm: Normal rate and regular rhythm.     Pulses: Normal pulses.     Heart sounds: Normal heart sounds.  Pulmonary:     Effort: Pulmonary effort is normal.     Breath sounds: Normal breath sounds.  Musculoskeletal:        General: Swelling present.     Cervical back: Normal range of motion.     Right lower leg: Edema present.     Left lower leg: Edema present.  Skin:    General: Skin is warm and dry.     Capillary Refill: Capillary refill takes less than 2 seconds.  Neurological:     General: No focal deficit present.     Mental Status: He is alert and oriented to person, place, and time.  Psychiatric:        Mood and Affect: Mood normal.        Behavior: Behavior normal.        Thought Content: Thought content normal.        Judgment: Judgment normal.     BP 121/70 (BP Location: Left Arm, Patient Position: Sitting, Cuff Size: Normal)   Pulse 67   Temp (!) 97 F (36.1 C) (Temporal)   Ht 5\' 5"  (1.651 m)   Wt 164 lb (74.4 kg)   SpO2 96%   BMI 27.29 kg/m  Wt Readings from Last 3 Encounters:  02/03/21 164 lb (74.4 kg)  12/23/20 166 lb (75.3 kg)  09/02/20 168 lb 3.2 oz (76.3 kg)     Health Maintenance Due  Topic Date Due  . COVID-19 Vaccine (3 - Booster) 04/05/2020    There are no preventive care  reminders to display for this patient.  Lab Results  Component Value Date   TSH 0.744 08/25/2019   Lab Results  Component Value Date   WBC 6.8 12/23/2020  HGB 12.4 (L) 12/23/2020   HCT 35.8 (L) 12/23/2020   MCV 88 12/23/2020   PLT 340 12/23/2020   Lab Results  Component Value Date   NA 146 (H) 12/23/2020   K 4.8 12/23/2020   CO2 23 06/29/2017   GLUCOSE 114 (H) 12/23/2020   BUN 28 (H) 12/23/2020   CREATININE 1.70 (H) 12/23/2020   BILITOT 0.2 12/23/2020   ALKPHOS 122 (H) 12/23/2020   AST 14 12/23/2020   ALT 17 06/29/2017   PROT 7.3 12/23/2020   ALBUMIN 4.4 12/23/2020   CALCIUM 9.7 12/23/2020   ANIONGAP 13 12/30/2015   Lab Results  Component Value Date   CHOL 197 03/11/2020   Lab Results  Component Value Date   HDL 54 03/11/2020   Lab Results  Component Value Date   LDLCALC 116 (H) 03/11/2020   Lab Results  Component Value Date   TRIG 155 (H) 03/11/2020   Lab Results  Component Value Date   CHOLHDL 3.6 03/11/2020   Lab Results  Component Value Date   HGBA1C 5.1 08/25/2019      Assessment & Plan:   Problem List Items Addressed This Visit      Genitourinary   Chronic kidney disease (CKD) stage G3a/A1, moderately decreased glomerular filtration rate (GFR) between 45-59 mL/min/1.73 square meter and albuminuria creatinine ratio less than 30 mg/g ([place]) Persistent unsure why consult was not completed; Daniel Haynes states that he has been seen by urology. Explained to patient that this was not the same .   Relevant Orders   Comp. Metabolic Panel (12)    Other Visit Diagnoses    Chronic right shoulder pain    -   Declined ortho/Daniel Haynes referral again will call back   Relevant Medications   methocarbamol (ROBAXIN) 750 MG tablet   Acute pain of left shoulder       Sciatica of right side       Relevant Medications   methocarbamol (ROBAXIN) 750 MG tablet   Hypertension, unspecified type    Controlled on current regimen Encouraged on going compliance with current  medication regimen Encouraged home monitoring and recording BP <130/80 Eating a heart-healthy diet with less salt Encouraged regular physical activity     Relevant Medications   amLODipine (NORVASC) 10 MG tablet   hydrochlorothiazide (HYDRODIURIL) 25 MG tablet   lisinopril (ZESTRIL) 20 MG tablet   pravastatin (PRAVACHOL) 40 MG tablet   Other Relevant Orders   Comp. Metabolic Panel (12)   Essential hypertension       Relevant Medications   amLODipine (NORVASC) 10 MG tablet   hydrochlorothiazide (HYDRODIURIL) 25 MG tablet   lisinopril (ZESTRIL) 20 MG tablet   pravastatin (PRAVACHOL) 40 MG tablet   Pain in both hands     Labs pending Hx of Inflammation and Gout will treat empirically    Relevant Orders   Uric Acid   Sedimentation rate      Meds ordered this encounter  Medications  . colchicine 0.6 MG tablet    Sig: 1.2 mg at the first sign of flare, followed by 0.6 mg after 1 hour    Dispense:  30 tablet    Refill:  1    Order Specific Question:   Supervising Provider    Answer:   Tresa Garter [id number]  . methocarbamol (ROBAXIN) 750 MG tablet    Sig: Take 1 tablet (750 mg total) by mouth every 8 (eight) hours as needed for up to 10 days for muscle spasms.  Dispense:  30 tablet    Refill:  5    Order Specific Question:   Supervising Provider    Answer:   Tresa Garter [id number]  . amLODipine (NORVASC) 10 MG tablet    Sig: TAKE 1 TABLET (10 MG TOTAL) BY MOUTH DAILY.    Dispense:  90 tablet    Refill:  3    Order Specific Question:   Supervising Provider    Answer:   Tresa Garter [id number]  . hydrochlorothiazide (HYDRODIURIL) 25 MG tablet    Sig: TAKE 1 TABLET (25 MG TOTAL) BY MOUTH DAILY.    Dispense:  90 tablet    Refill:  3    Order Specific Question:   Supervising Provider    Answer:   Tresa Garter [id number]  . lisinopril (ZESTRIL) 20 MG tablet    Sig: TAKE 1 TABLET (20 MG TOTAL) BY MOUTH DAILY.    Dispense:  90 tablet    Refill:   3    Order Specific Question:   Supervising Provider    Answer:   Tresa Garter [id number]  . pravastatin (PRAVACHOL) 40 MG tablet    Sig: TAKE 1 TABLET (40 MG TOTAL) BY MOUTH DAILY.    Dispense:  90 tablet    Refill:  3    Order Specific Question:   Supervising Provider    Answer:   Tresa Garter [id number]    Follow-up: Return in about 3 months (around 05/05/2021).    Vevelyn Francois, NP

## 2021-02-04 LAB — COMP. METABOLIC PANEL (12)
AST: 12 IU/L (ref 0–40)
Albumin/Globulin Ratio: 1.4 (ref 1.2–2.2)
Albumin: 3.9 g/dL (ref 3.8–4.8)
Alkaline Phosphatase: 139 IU/L — ABNORMAL HIGH (ref 44–121)
BUN/Creatinine Ratio: 19 (ref 10–24)
BUN: 38 mg/dL — ABNORMAL HIGH (ref 8–27)
Bilirubin Total: <0.2 mg/dL (ref 0.0–1.2)
Calcium: 9.2 mg/dL (ref 8.6–10.2)
Chloride: 101 mmol/L (ref 96–106)
Creatinine, Ser: 1.96 mg/dL — ABNORMAL HIGH (ref 0.76–1.27)
Globulin, Total: 2.8 g/dL (ref 1.5–4.5)
Glucose: 125 mg/dL — ABNORMAL HIGH (ref 65–99)
Potassium: 4.2 mmol/L (ref 3.5–5.2)
Sodium: 139 mmol/L (ref 134–144)
Total Protein: 6.7 g/dL (ref 6.0–8.5)
eGFR: 36 mL/min/{1.73_m2} — ABNORMAL LOW (ref >59)

## 2021-02-04 LAB — SEDIMENTATION RATE: Sed Rate: 70 mm/hr — ABNORMAL HIGH (ref 0–30)

## 2021-02-04 LAB — URIC ACID: Uric Acid: 8.7 mg/dL — ABNORMAL HIGH (ref 3.8–8.4)

## 2021-02-10 ENCOUNTER — Other Ambulatory Visit: Payer: Self-pay | Admitting: Nurse Practitioner

## 2021-02-10 DIAGNOSIS — N1831 Chronic kidney disease, stage 3a: Secondary | ICD-10-CM

## 2021-02-10 NOTE — Progress Notes (Signed)
   [facility] [facility] [place], [place]  [address] Phone:  [phone]   Fax:  [phone] Assessment  Primary Diagnosis & Pertinent Problem List: The encounter diagnosis was Chronic kidney disease (CKD) stage G3a/A1, moderately decreased glomerular filtration rate (GFR) between 45-59 mL/min/1.73 square meter and albuminuria creatinine ratio less than 30 mg/g ([place]).  Visit Diagnosis: 1. Chronic kidney disease (CKD) stage G3a/A1, moderately decreased glomerular filtration rate (GFR) between 45-59 mL/min/1.73 square meter and albuminuria creatinine ratio less than 30 mg/g ([facility])

## 2021-02-18 ENCOUNTER — Telehealth: Payer: Self-pay

## 2021-02-18 ENCOUNTER — Other Ambulatory Visit: Payer: Self-pay

## 2021-02-18 NOTE — Telephone Encounter (Signed)
Pt came into office and said pharmacy reject his medication   methocarbamol

## 2021-02-19 NOTE — Telephone Encounter (Signed)
Tried to pharmacy not able get any one on the phone, per pt chart he has 4 refills.

## 2021-02-20 ENCOUNTER — Other Ambulatory Visit: Payer: Self-pay

## 2021-02-21 ENCOUNTER — Other Ambulatory Visit: Payer: Self-pay

## 2021-02-24 ENCOUNTER — Telehealth: Payer: Self-pay | Admitting: Nurse Practitioner

## 2021-02-24 ENCOUNTER — Other Ambulatory Visit: Payer: Self-pay

## 2021-02-24 ENCOUNTER — Other Ambulatory Visit: Payer: Self-pay | Admitting: Nurse Practitioner

## 2021-02-24 DIAGNOSIS — R350 Frequency of micturition: Secondary | ICD-10-CM

## 2021-02-24 DIAGNOSIS — N401 Enlarged prostate with lower urinary tract symptoms: Secondary | ICD-10-CM

## 2021-02-24 MED ORDER — TAMSULOSIN HCL 0.4 MG PO CAPS
ORAL_CAPSULE | ORAL | 3 refills | Status: DC
  Filled 2021-02-24: qty 30, 30d supply, fill #0
  Filled 2021-03-25: qty 30, 30d supply, fill #1
  Filled 2021-05-19: qty 30, 30d supply, fill #2
  Filled 2021-06-23: qty 30, 30d supply, fill #3

## 2021-02-24 MED FILL — Solifenacin Succinate Tab 10 MG: ORAL | 30 days supply | Qty: 30 | Fill #1 | Status: AC

## 2021-02-24 NOTE — Telephone Encounter (Signed)
Patient is requesting refill on Tamulosin 0.4mg . I do not see this med on his list, but he was reading it from a medication bottle and stated that he saw Crystal recently and got refills approved.

## 2021-02-25 ENCOUNTER — Other Ambulatory Visit: Payer: Self-pay

## 2021-02-26 ENCOUNTER — Other Ambulatory Visit: Payer: Self-pay

## 2021-03-10 ENCOUNTER — Other Ambulatory Visit: Payer: Self-pay

## 2021-03-10 ENCOUNTER — Telehealth: Payer: Self-pay | Admitting: Nurse Practitioner

## 2021-03-10 ENCOUNTER — Other Ambulatory Visit: Payer: Self-pay | Admitting: Nurse Practitioner

## 2021-03-10 MED ORDER — COLCHICINE 0.6 MG PO TABS
ORAL_TABLET | ORAL | 1 refills | Status: DC
  Filled 2021-03-10: qty 30, 30d supply, fill #0
  Filled 2021-03-24: qty 30, 10d supply, fill #1

## 2021-03-10 NOTE — Telephone Encounter (Signed)
Patient need a refill on Colchicine 0.6mg  tablets.

## 2021-03-10 NOTE — Telephone Encounter (Signed)
Medication refilled

## 2021-03-24 ENCOUNTER — Other Ambulatory Visit: Payer: Self-pay

## 2021-03-25 ENCOUNTER — Other Ambulatory Visit: Payer: Self-pay

## 2021-03-26 ENCOUNTER — Other Ambulatory Visit: Payer: Self-pay

## 2021-03-31 ENCOUNTER — Other Ambulatory Visit: Payer: Self-pay

## 2021-04-01 MED FILL — Solifenacin Succinate Tab 10 MG: ORAL | 30 days supply | Qty: 30 | Fill #2 | Status: AC

## 2021-04-02 ENCOUNTER — Other Ambulatory Visit: Payer: Self-pay

## 2021-04-08 ENCOUNTER — Other Ambulatory Visit: Payer: Self-pay

## 2021-04-08 DIAGNOSIS — Z20822 Contact with and (suspected) exposure to covid-19: Secondary | ICD-10-CM | POA: Diagnosis not present

## 2021-04-09 ENCOUNTER — Other Ambulatory Visit (HOSPITAL_COMMUNITY): Payer: Self-pay

## 2021-04-09 ENCOUNTER — Telehealth: Payer: Self-pay

## 2021-04-09 ENCOUNTER — Other Ambulatory Visit: Payer: Self-pay | Admitting: Nurse Practitioner

## 2021-04-09 ENCOUNTER — Other Ambulatory Visit: Payer: Self-pay

## 2021-04-09 MED ORDER — COLCHICINE 0.6 MG PO TABS
ORAL_TABLET | ORAL | 1 refills | Status: DC
  Filled 2021-04-09: qty 30, 10d supply, fill #0

## 2021-04-11 NOTE — Telephone Encounter (Signed)
Error

## 2021-04-14 ENCOUNTER — Other Ambulatory Visit (HOSPITAL_COMMUNITY): Payer: Self-pay

## 2021-04-14 ENCOUNTER — Other Ambulatory Visit: Payer: Self-pay

## 2021-04-16 ENCOUNTER — Other Ambulatory Visit: Payer: Self-pay

## 2021-04-18 ENCOUNTER — Other Ambulatory Visit: Payer: Self-pay

## 2021-05-05 ENCOUNTER — Other Ambulatory Visit: Payer: Self-pay

## 2021-05-05 ENCOUNTER — Ambulatory Visit (HOSPITAL_COMMUNITY)
Admission: RE | Admit: 2021-05-05 | Discharge: 2021-05-05 | Disposition: A | Discharge status: Home / Self Care | Payer: Medicare PPO | Source: Ambulatory Visit | Attending: Nurse Practitioner | Admitting: Nurse Practitioner

## 2021-05-05 ENCOUNTER — Ambulatory Visit (INDEPENDENT_AMBULATORY_CARE_PROVIDER_SITE_OTHER): Payer: Medicare PPO | Admitting: Nurse Practitioner

## 2021-05-05 ENCOUNTER — Encounter: Payer: Self-pay | Admitting: Nurse Practitioner

## 2021-05-05 VITALS — BP 155/78 | HR 65 | Temp 99.9°F | Ht 66.0 in | Wt 152.4 lb

## 2021-05-05 DIAGNOSIS — R202 Paresthesia of skin: Secondary | ICD-10-CM | POA: Diagnosis not present

## 2021-05-05 DIAGNOSIS — M79641 Pain in right hand: Secondary | ICD-10-CM

## 2021-05-05 DIAGNOSIS — G8929 Other chronic pain: Secondary | ICD-10-CM | POA: Diagnosis not present

## 2021-05-05 DIAGNOSIS — Z9889 Other specified postprocedural states: Secondary | ICD-10-CM | POA: Diagnosis not present

## 2021-05-05 DIAGNOSIS — R350 Frequency of micturition: Secondary | ICD-10-CM | POA: Diagnosis not present

## 2021-05-05 DIAGNOSIS — I10 Essential (primary) hypertension: Secondary | ICD-10-CM

## 2021-05-05 DIAGNOSIS — M79642 Pain in left hand: Secondary | ICD-10-CM | POA: Insufficient documentation

## 2021-05-05 DIAGNOSIS — M19041 Primary osteoarthritis, right hand: Secondary | ICD-10-CM | POA: Diagnosis not present

## 2021-05-05 DIAGNOSIS — M25511 Pain in right shoulder: Secondary | ICD-10-CM | POA: Diagnosis not present

## 2021-05-05 DIAGNOSIS — M542 Cervicalgia: Secondary | ICD-10-CM | POA: Diagnosis not present

## 2021-05-05 DIAGNOSIS — N401 Enlarged prostate with lower urinary tract symptoms: Secondary | ICD-10-CM | POA: Diagnosis not present

## 2021-05-05 DIAGNOSIS — E785 Hyperlipidemia, unspecified: Secondary | ICD-10-CM | POA: Diagnosis not present

## 2021-05-05 MED ORDER — FINASTERIDE 5 MG PO TABS
5.0000 mg | ORAL_TABLET | Freq: Every day | ORAL | 11 refills | Status: DC
  Filled 2021-05-05: qty 30, 30d supply, fill #0
  Filled 2021-06-23: qty 30, 30d supply, fill #1

## 2021-05-05 MED ORDER — NITROFURANTOIN MONOHYD MACRO 100 MG PO CAPS
100.0000 mg | ORAL_CAPSULE | Freq: Two times a day (BID) | ORAL | 0 refills | Status: AC
  Filled 2021-05-05: qty 14, 7d supply, fill #0

## 2021-05-05 NOTE — Progress Notes (Signed)
[address] [place], [place]  [address] Phone:  [phone]   Fax:  [phone]   Established Patient Office Visit  Subjective:  Patient ID: Daniel Haynes, male    DOB: August 08, 1951  Age: 70 y.o. MRN: [id number]  CC:  Chief Complaint  Patient presents with   Shoulder Pain    HPI Daniel Haynes presents for follow-up. He  has a past medical history of Arthritis, Gout, Hyperlipidemia, Hypertension, and Insomnia (08/2019).   Mr. Kinn continues to have ongoing shoulder pain that radiates down to his hands.  He admits that the right is greater than the left.  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  He is very concerned about his pain.  He is currently on gabapentin 300 mg along with muscle relaxants which is not effective for his pain.  He was previously recommended to start physical therapy however he declined.   He has hypertension is currently on amlodipine 10 mg, hydrochlorothiazide 25 mg and lisinopril 20 mg.  He is compliant with his medication.  He reports a significant change in his eating habits which has yielded a 9 pound weight loss.  He also suffered from increased swelling to bilateral lower extremities.  This has also improved. He feels like his gout has been controlled with the change in diet.  He has a history of overactive bladder and has been treated with Vesicare 10 mg he is also on tamsulosin 0.4 mg he does not feel like this is effective.  He has been seen by urology but did not like the examination procedures at that time.  He would like to try something different for his urinary frequency. Past Medical History:  Diagnosis Date   Arthritis    hands fingers wrists    Gout    Hyperlipidemia    Hypertension    Insomnia 08/2019    Past Surgical History:  Procedure Laterality Date   CERVICAL DISCECTOMY  2004   [facility]    Family History  Problem Relation Age of Onset   Cancer Mother    Cancer Father    Colon cancer Neg Hx     Colon polyps Neg Hx    Esophageal cancer Neg Hx    Rectal cancer Neg Hx    Stomach cancer Neg Hx     Social History   Socioeconomic History   Marital status: Single    Spouse name: Not on file   Number of children: Not on file   Years of education: Not on file   Highest education level: Not on file  Occupational History   Not on file  Tobacco Use   Smoking status: Former   Smokeless tobacco: Never  Substance and Sexual Activity   Alcohol use: Yes    Alcohol/week: 21.0 standard drinks    Types: 21 Shots of liquor per week    Comment: 3 a day    Drug use: Yes    Types: Marijuana    Comment: occ use    Sexual activity: Not on file  Other Topics Concern   Not on file  Social History Narrative   Not on file   Social Determinants of Health   Financial Resource Strain: Not on file  Food Insecurity: Not on file  Transportation Needs: Not on file  Physical Activity: Not on file  Stress: Not on file  Social Connections: Not on file  Intimate Partner Violence: Not on file    Outpatient  Medications Prior to Visit  Medication Sig Dispense Refill   amLODipine (NORVASC) 10 MG tablet TAKE 1 TABLET (10 MG TOTAL) BY MOUTH DAILY. 90 tablet 3   colchicine 0.6 MG tablet TAKE 2 TBLETS (1.2MG) BY MOUTH AT FIRST SIGN OF FLARE,FOLLOWED BY 1 TABLET (0.6MG) AFTER 1 HOUR 30 tablet 1   gabapentin (NEURONTIN) 300 MG capsule TAKE 1 CAPSULE (300 MG TOTAL) BY MOUTH 3 (THREE) TIMES DAILY. 90 capsule 2   hydrochlorothiazide (HYDRODIURIL) 25 MG tablet TAKE 1 TABLET (25 MG TOTAL) BY MOUTH DAILY. 90 tablet 3   lisinopril (ZESTRIL) 20 MG tablet TAKE 1 TABLET (20 MG TOTAL) BY MOUTH DAILY. 90 tablet 3   pravastatin (PRAVACHOL) 40 MG tablet TAKE 1 TABLET (40 MG TOTAL) BY MOUTH DAILY. 90 tablet 3   solifenacin (VESICARE) 10 MG tablet TAKE 1 TABLET (10 MG TOTAL) BY MOUTH DAILY. 30 tablet 11   tamsulosin (FLOMAX) 0.4 MG CAPS capsule TAKE 1 CAPSULE (0.4 MG TOTAL) BY MOUTH DAILY AFTER BREAKFAST. 90 capsule  3   traZODone (DESYREL) 50 MG tablet TAKE 1 TABLET (50 MG TOTAL) BY MOUTH AT BEDTIME AS NEEDED FOR SLEEP. 90 tablet 3   No facility-administered medications prior to visit.    No Known Allergies  ROS Review of Systems  Constitutional:  Positive for unexpected weight change (10 pounds).  Genitourinary:        Flow varies Nocturia Frequency Urgency  Small stream     Objective:    Physical Exam Constitutional:      General: He is not in acute distress.    Appearance: He is normal weight. He is not ill-appearing, toxic-appearing or diaphoretic.  HENT:     Head: Normocephalic and atraumatic.  Cardiovascular:     Rate and Rhythm: Normal rate and regular rhythm.     Pulses: Normal pulses.     Heart sounds: Normal heart sounds.  Pulmonary:     Effort: Pulmonary effort is normal.     Breath sounds: Normal breath sounds.  Abdominal:     Palpations: Abdomen is soft.  Musculoskeletal:     Cervical back: Normal range of motion.     Comments: Bilateral braces to hands and wrist no visible swelling tenderness  Skin:    General: Skin is warm.     Capillary Refill: Capillary refill takes less than 2 seconds.  Neurological:     General: No focal deficit present.     Mental Status: He is alert.    BP (!) 155/78 (BP Location: Right Arm, Patient Position: Sitting, Cuff Size: Normal)   Pulse 65   Temp 99.9 F (37.7 C)   Ht '5\' 6"'  (1.676 m)   Wt 152 lb 6.4 oz (69.1 kg)   SpO2 98%   BMI 24.60 kg/m  Wt Readings from Last 3 Encounters:  05/05/21 152 lb 6.4 oz (69.1 kg)  02/03/21 164 lb (74.4 kg)  12/23/20 166 lb (75.3 kg)     Health Maintenance Due  Topic Date Due   Zoster Vaccines- Shingrix (1 of 2) Never done   COVID-19 Vaccine (3 - Booster) 03/05/2020    There are no preventive care reminders to display for this patient.  Lab Results  Component Value Date   TSH 0.744 08/25/2019   Lab Results  Component Value Date   WBC 6.8 12/23/2020   HGB 12.4 (L) 12/23/2020    HCT 35.8 (L) 12/23/2020   MCV 88 12/23/2020   PLT 340 12/23/2020   Lab Results  Component Value  Date   NA 139 02/03/2021   K 4.2 02/03/2021   CO2 23 06/29/2017   GLUCOSE 125 (H) 02/03/2021   BUN 38 (H) 02/03/2021   CREATININE 1.96 (H) 02/03/2021   BILITOT <0.2 02/03/2021   ALKPHOS 139 (H) 02/03/2021   AST 12 02/03/2021   ALT 17 06/29/2017   PROT 6.7 02/03/2021   ALBUMIN 3.9 02/03/2021   CALCIUM 9.2 02/03/2021   ANIONGAP 13 12/30/2015   EGFR 36 (L) 02/03/2021  +  Lab Results  Component Value Date   HGBA1C 5.1 08/25/2019      Assessment & Plan:   Problem List Items Addressed This Visit   None Visit Diagnoses     Chronic right shoulder pain    -  Primary Worsening Referral to orthopedist for further evaluation and treatment options   Relevant Orders   AMB referral to orthopedics   Arthritis Panel   Hypertension, unspecified type     Persistent however stable peripheral edema resolved Encouraged on going compliance with current medication regimen Encouraged home monitoring and recording BP Eating a heart-healthy diet with less salt Encouraged regular physical activity     Relevant Orders   Comp. Metabolic Panel (12)   Pain in both hands     Persistent x-rays pending   Relevant Orders   DG Hand Complete Left    DG Hand Complete Right    Arthritis Panel   Benign prostatic hyperplasia with urinary frequency     Worsening evaluation with urinalysis   Relevant Orders   CBC with Differential/Platelet   POCT urinalysis dipstick   Urine Culture    PSA   Ambulatory referral to Urology   Paresthesia     Worsening evaluation of cervical spine for possible reasons History of carpal tunnel   Relevant Orders   DG Cervical Spine Complete     TSH   Arthritis Panel   Magnesium   Hyperlipidemia, unspecified hyperlipidemia type     Stable labs pending   Relevant Orders   Lipid panel   History of cervical spinal surgery       Relevant Orders   AMB referral to  orthopedics       Meds ordered this encounter  Medications   finasteride (PROSCAR) 5 MG tablet    Sig: Take 1 tablet (5 mg total) by mouth daily.    Dispense:  30 tablet    Refill:  11    Order Specific Question:   Supervising Provider    Answer:   Tresa Garter [[id number]]   nitrofurantoin, macrocrystal-monohydrate, (MACROBID) 100 MG capsule    Sig: Take 1 capsule (100 mg total) by mouth 2 (two) times daily for 7 days.    Dispense:  14 capsule    Refill:  0    Order Specific Question:   Supervising Provider    Answer:   Tresa Garter [id number]     Follow-up: Return in about 4 weeks (around 06/02/2021) for visit [id number].    Vevelyn Francois, NP

## 2021-05-05 NOTE — Patient Instructions (Addendum)
Cervical Radiculopathy  Cervical radiculopathy means that a nerve in the neck (a cervical nerve) is pinched or bruised. This can happen because of an injury to the cervical spine (vertebrae) in the neck, or as a normal part of getting older. This can cause pain or loss of feeling (numbness) that runs from your neck all the way down to your arm and fingers. Often, this condition gets better with rest. Treatment may be needed if the conditiondoes not get better. What are the causes? A neck injury. A bulging disk in your spine. Muscle movements that you cannot control (muscle spasms). Tight muscles in your neck due to overuse. Arthritis. Breakdown in the bones and joints of the spine (spondylosis) due to getting older. Bone spurs that form near the nerves in the neck. What are the signs or symptoms? Pain. The pain may: Run from the neck to the arm and hand. Be very bad or irritating. Be worse when you move your neck. Loss of feeling or tingling in your arm or hand. Weakness in your arm or hand, in very bad cases. How is this treated? In many cases, treatment is not needed for this condition. With rest, the condition often gets better over time. If treatment is needed, options may include: Wearing a soft neck collar (cervical collar) for short periods of time, as told by your doctor. Doing exercises (physical therapy) to strengthen your neck muscles. Taking medicines. Having shots (injections) in your spine, in very bad cases. Having surgery. This may be needed if other treatments do not help. The type of surgery that is used depends on the cause of your condition. Follow these instructions at home: If you have a soft neck collar: Wear it as told by your doctor. Remove it only as told by your doctor. Ask your doctor if you can remove the collar for cleaning and bathing. If you are allowed to remove the collar for cleaning or bathing: Follow instructions from your doctor about how to remove  the collar safely. Clean the collar by wiping it with mild soap and water and drying it completely. Take out any removable pads in the collar every 1-2 days. Wash them by hand with soap and water. Let them air-dry completely before you put them back in the collar. Check your skin under the collar for redness or sores. If you see any, tell your doctor. Managing pain     Take over-the-counter and prescription medicines only as told by your doctor. If told, put ice on the painful area. If you have a soft neck collar, remove it as told by your doctor. Put ice in a plastic bag. Place a towel between your skin and the bag. Leave the ice on for 20 minutes, 2-3 times a day. If using ice does not help, you can try using heat. Use the heat source that your doctor recommends, such as a moist heat pack or a heating pad. Place a towel between your skin and the heat source. Leave the heat on for 20-30 minutes. Remove the heat if your skin turns bright red. This is very important if you are unable to feel pain, heat, or cold. You may have a greater risk of getting burned. You may try a gentle neck and shoulder rub (massage). Activity Rest as needed. Return to your normal activities as told by your doctor. Ask your doctor what activities are safe for you. Do exercises as told by your doctor or physical therapist. Do not lift anything that   is heavier than 10 lb (4.5 kg) until your doctor tells you that it is safe. General instructions Use a flat pillow when you sleep. Do not drive while wearing a soft neck collar. If you do not have a soft neck collar, ask your doctor if it is safe to drive while your neck heals. Ask your doctor if the medicine prescribed to you requires you to avoid driving or using heavy machinery. Do not use any products that contain nicotine or tobacco, such as cigarettes, e-cigarettes, and chewing tobacco. These can delay healing. If you need help quitting, ask your doctor. Keep all  follow-up visits as told by your doctor. This is important. Contact a doctor if: Your condition does not get better with treatment. Get help right away if: Your pain gets worse and is not helped with medicine. You lose feeling or feel weak in your hand, arm, face, or leg. You have a high fever. You have a stiff neck. You cannot control when you poop or pee (have incontinence). You have trouble with walking, balance, or talking. Summary Cervical radiculopathy means that a nerve in the neck is pinched or bruised. A nerve can get pinched from a bulging disk, arthritis, an injury to the neck, or other causes. Symptoms include pain, tingling, or loss of feeling that goes from the neck into the arm or hand. Weakness in your arm or hand can happen in very bad cases. Treatment may include resting, wearing a soft neck collar, and doing exercises. You might need to take medicines for pain. In very bad cases, shots or surgery may be needed. This information is not intended to replace advice given to you by your health care provider. Make sure you discuss any questions you have with your healthcare provider. Document Revised: 08/26/2018 Document Reviewed: 08/26/2018 Elsevier Patient Education  2022 [employer]. Shoulder Impingement Syndrome  Shoulder impingement syndrome is a condition that causes pain when connective tissues (tendons) surrounding the shoulder joint become pinched. These tendons are part of the group of muscles and tissues that help to stabilize the shoulder (rotator cuff). Beneath the rotator cuff is a fluid-filled sac (bursa) that allows the muscles and tendons to glide smoothly. The bursa may become swollen or irritated (bursitis). Bursitis, swelling in the rotator cuff tendons, or both conditions can decrease how much space is under a bone in the shoulder joint (acromion), resulting in impingement. What are the causes? Shoulder impingement syndrome may be caused by bursitis or  swelling of the rotator cuff tendons, which may result from: Repetitive overhead arm movements. Falling onto the shoulder. Weakness in the shoulder muscles. What increases the risk? You may be more likely to develop this condition if you: Play sports that involve throwing, such as baseball. Participate in sports such as tennis, volleyball, and swimming. Work as a Curator, Games developer, or Architect. Some people are also more likely to develop impingement syndrome because of theshape of their acromion bone. What are the signs or symptoms? The main symptom of this condition is pain on the front or side of the shoulder. The pain may: Get worse when lifting or raising the arm. Get worse at night. Wake you up from sleeping. Feel sharp when the shoulder is moved and then fade to an ache. Other symptoms may include: Tenderness. Stiffness. Inability to raise the arm above shoulder level or behind the body. Weakness. How is this diagnosed? This condition may be diagnosed based on: Your symptoms and medical history. A physical exam. Imaging  tests, such as: X-rays. MRI. Ultrasound. How is this treated? This condition may be treated by: Resting your shoulder and avoiding all activities that cause pain or put stress on the shoulder. Icing your shoulder. NSAIDs to help reduce pain and swelling. One or more injections of medicines to numb the area and reduce inflammation. Physical therapy. Surgery. This may be needed if nonsurgical treatments have not helped. Surgery may involve repairing the rotator cuff, reshaping the acromion, or removing the bursa. Follow these instructions at home: Managing pain, stiffness, and swelling  If directed, put ice on the injured area. Put ice in a plastic bag. Place a towel between your skin and the bag. Leave the ice on for 20 minutes, 2-3 times a day.  Activity Rest and return to your normal activities as told by your health care provider. Ask your  health care provider what activities are safe for you. Do exercises as told by your health care provider. General instructions Do not use any products that contain nicotine or tobacco, such as cigarettes, e-cigarettes, and chewing tobacco. These can delay healing. If you need help quitting, ask your health care provider. Ask your health care provider when it is safe for you to drive. Take over-the-counter and prescription medicines only as told by your health care provider. Keep all follow-up visits as told by your health care provider. This is important. How is this prevented? Give your body time to rest between periods of activity. Be safe and responsible while being active. This will help you avoid falls. Maintain physical fitness, including strength and flexibility. Contact a health care provider if: Your symptoms have not improved after 1-2 months of treatment and rest. You cannot lift your arm away from your body. Summary Shoulder impingement syndrome is a condition that causes pain when connective tissues (tendons) surrounding the shoulder joint become pinched. The main symptom of this condition is pain on the front or side of the shoulder. This condition is usually treated with rest, ice, and pain medicines as needed. This information is not intended to replace advice given to you by your health care provider. Make sure you discuss any questions you have with your healthcare provider. Document Revised: 01/27/2019 Document Reviewed: 03/30/2018 Elsevier Patient Education  2022 [employer].

## 2021-05-06 ENCOUNTER — Other Ambulatory Visit: Payer: Self-pay

## 2021-05-08 LAB — POCT URINALYSIS DIPSTICK
Bilirubin, UA: NEGATIVE
Blood, UA: NEGATIVE
Glucose, UA: NEGATIVE
Ketones, UA: NEGATIVE
Nitrite, UA: NEGATIVE
Protein, UA: NEGATIVE
Spec Grav, UA: 1.025 (ref 1.010–1.025)
Urobilinogen, UA: 0.2 E.U./dL
pH, UA: 7 (ref 5.0–8.0)

## 2021-05-11 LAB — URINE CULTURE

## 2021-05-14 DIAGNOSIS — Z20822 Contact with and (suspected) exposure to covid-19: Secondary | ICD-10-CM | POA: Diagnosis not present

## 2021-05-19 ENCOUNTER — Other Ambulatory Visit: Payer: Self-pay

## 2021-05-19 MED FILL — Solifenacin Succinate Tab 10 MG: ORAL | 30 days supply | Qty: 30 | Fill #3 | Status: AC

## 2021-05-20 ENCOUNTER — Other Ambulatory Visit: Payer: Self-pay

## 2021-05-21 DIAGNOSIS — Z20828 Contact with and (suspected) exposure to other viral communicable diseases: Secondary | ICD-10-CM | POA: Diagnosis not present

## 2021-05-21 DIAGNOSIS — Z20822 Contact with and (suspected) exposure to covid-19: Secondary | ICD-10-CM | POA: Diagnosis not present

## 2021-05-21 IMAGING — DX DG SHOULDER 2+V*L*
3 series · 3 of 3 positions shown · non-contrast
Comparison: None.

CLINICAL DATA: shoulder pain

EXAM:
LEFT SHOULDER - 2+ VIEW; RIGHT SHOULDER - 2+ VIEW

[shoulder grashey]
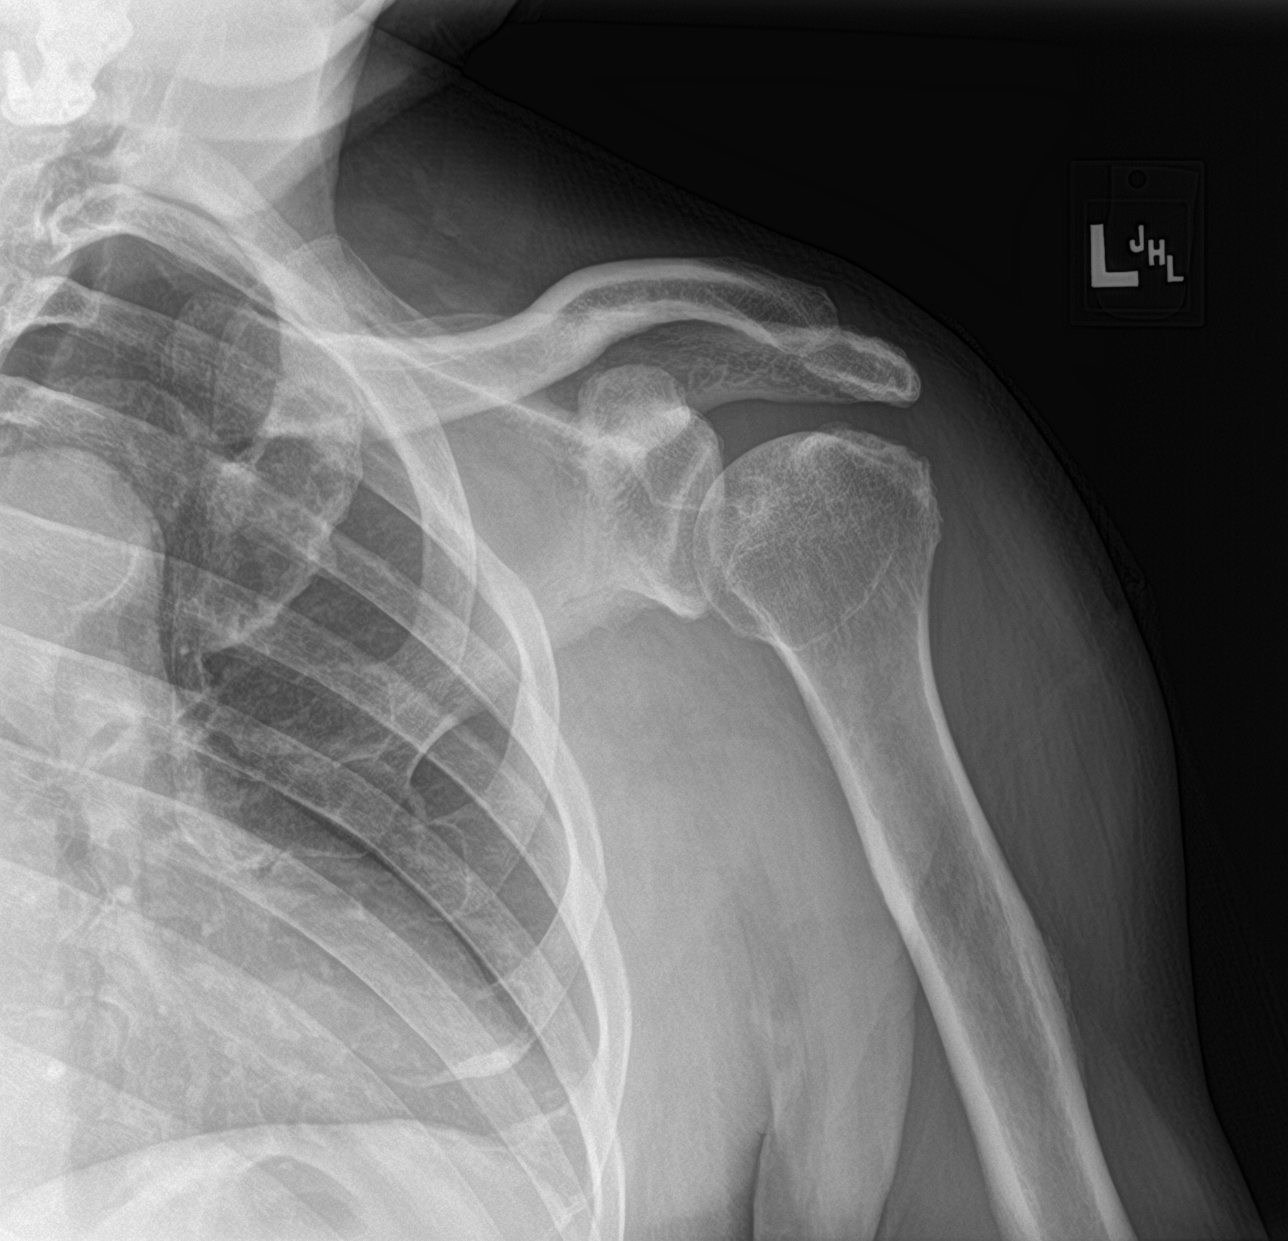

[shoulder y view]
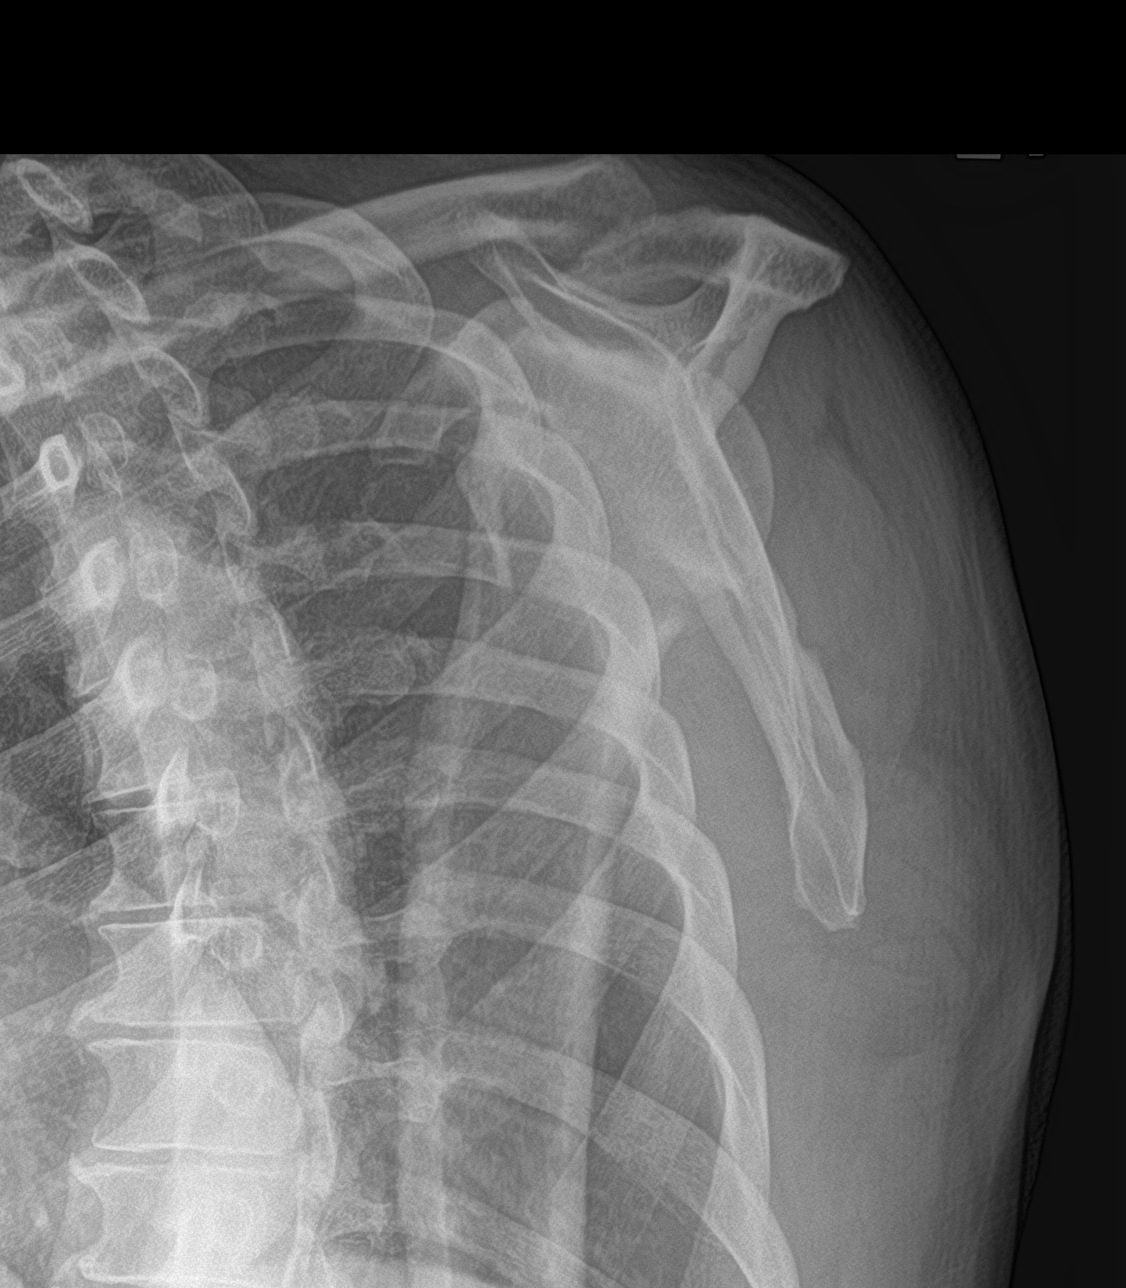

[shoulder axillary]
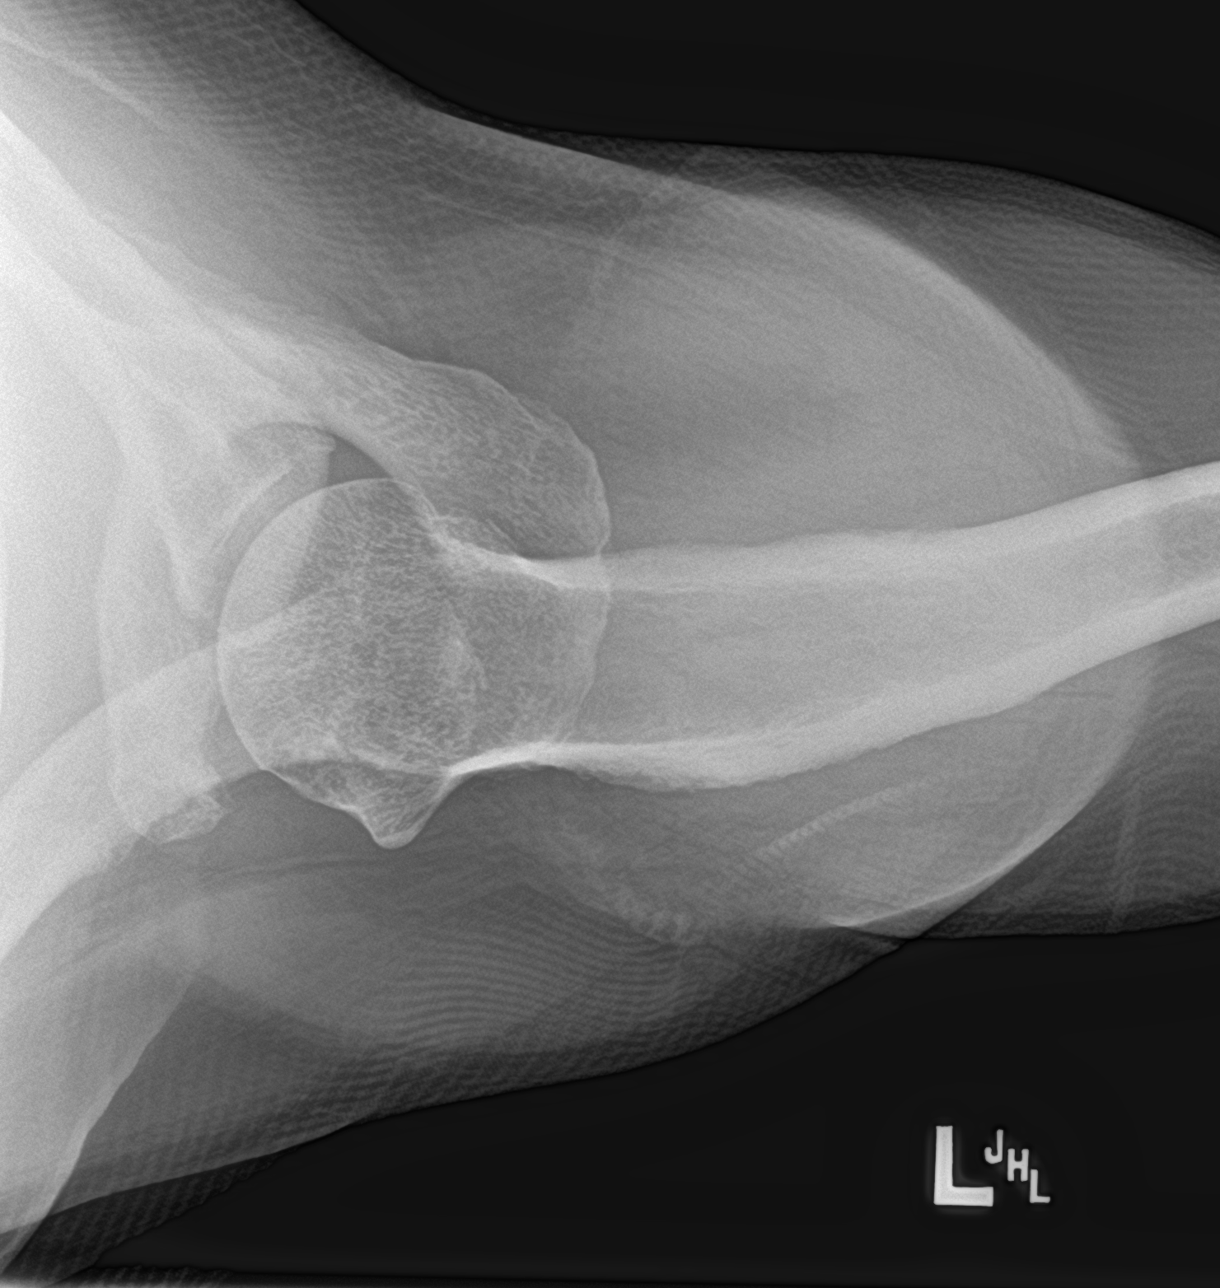

[3 of 3 positions shown; findings below may reference images not displayed]

FINDINGS: Left shoulder: Normal alignment. No fracture or focal osseous
lesion. Greater tuberosity prominence and irregularity. Glenohumeral
and acromioclavicular osteophytosis with mild joint space loss. Soft
tissues within normal limits.

Right shoulder: Normal alignment. Glenohumeral and acromioclavicular
joint space loss and osteophytosis. No fracture or focal osseous
lesion. Soft tissues within normal limits.
IMPRESSION: No acute osseous abnormality.  Mild-to-moderate osteoarthrosis.

Left greater tuberosity irregularity is suspicious for supraspinatus
tendinopathy.

## 2021-05-21 IMAGING — DX DG SHOULDER 2+V*R*
3 series · 3 of 3 positions shown · non-contrast
Comparison: None.

CLINICAL DATA: shoulder pain

EXAM:
LEFT SHOULDER - 2+ VIEW; RIGHT SHOULDER - 2+ VIEW

[shoulder grashey]
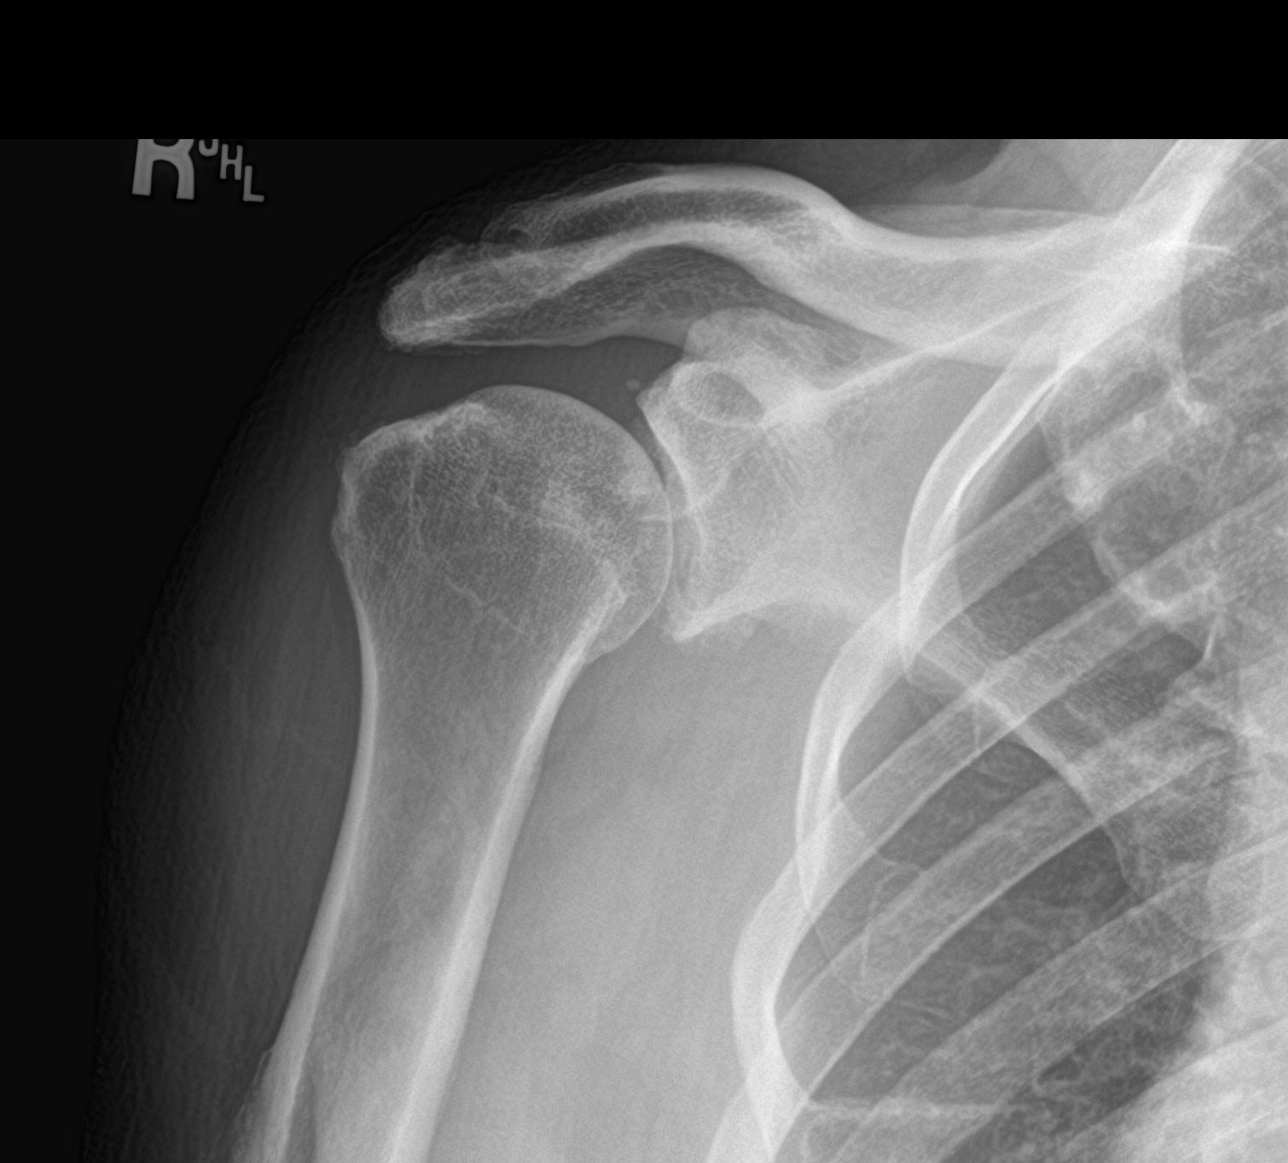

[shoulder y view]
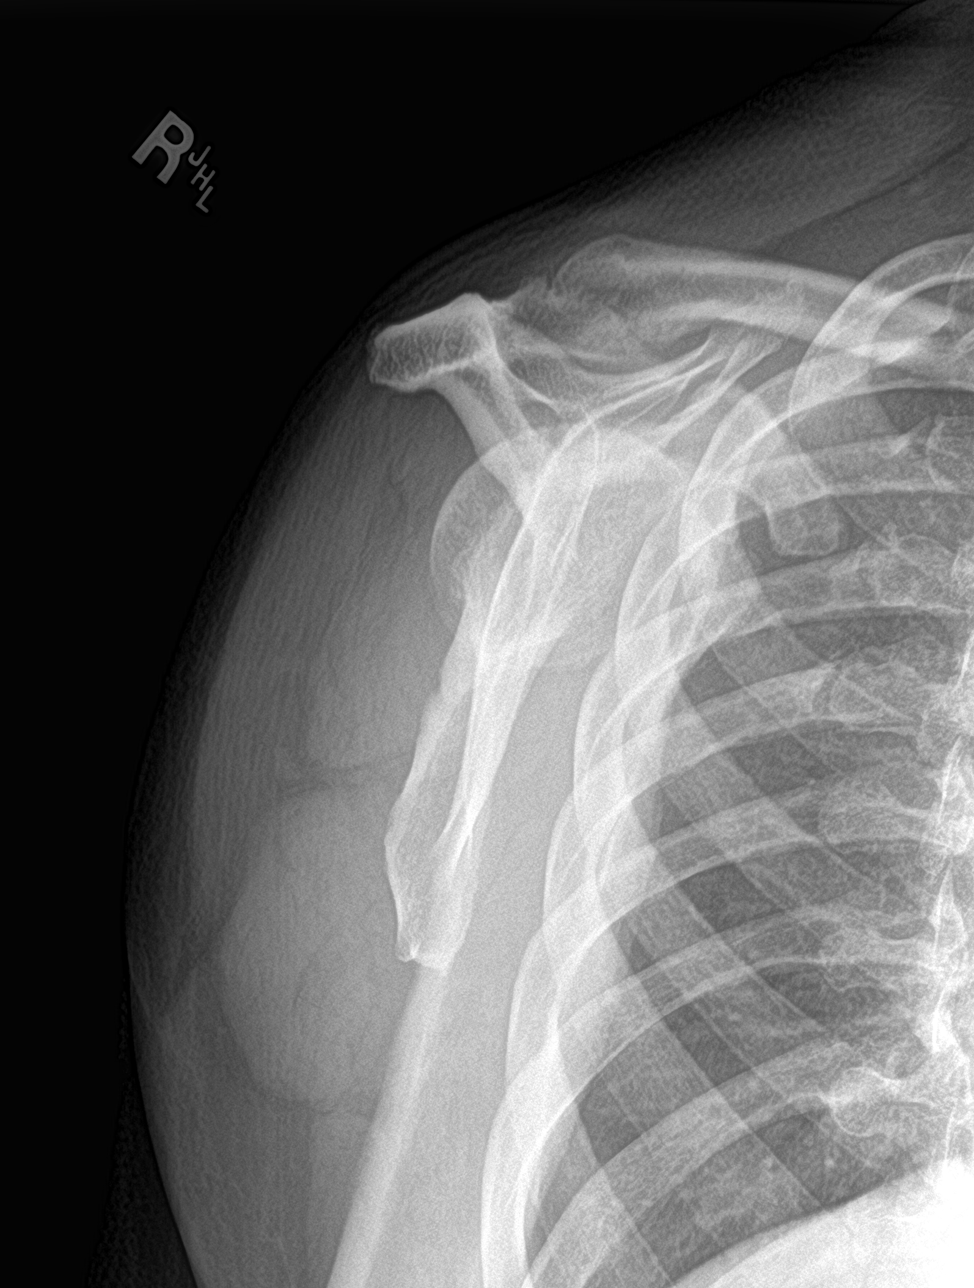

[shoulder axillary]
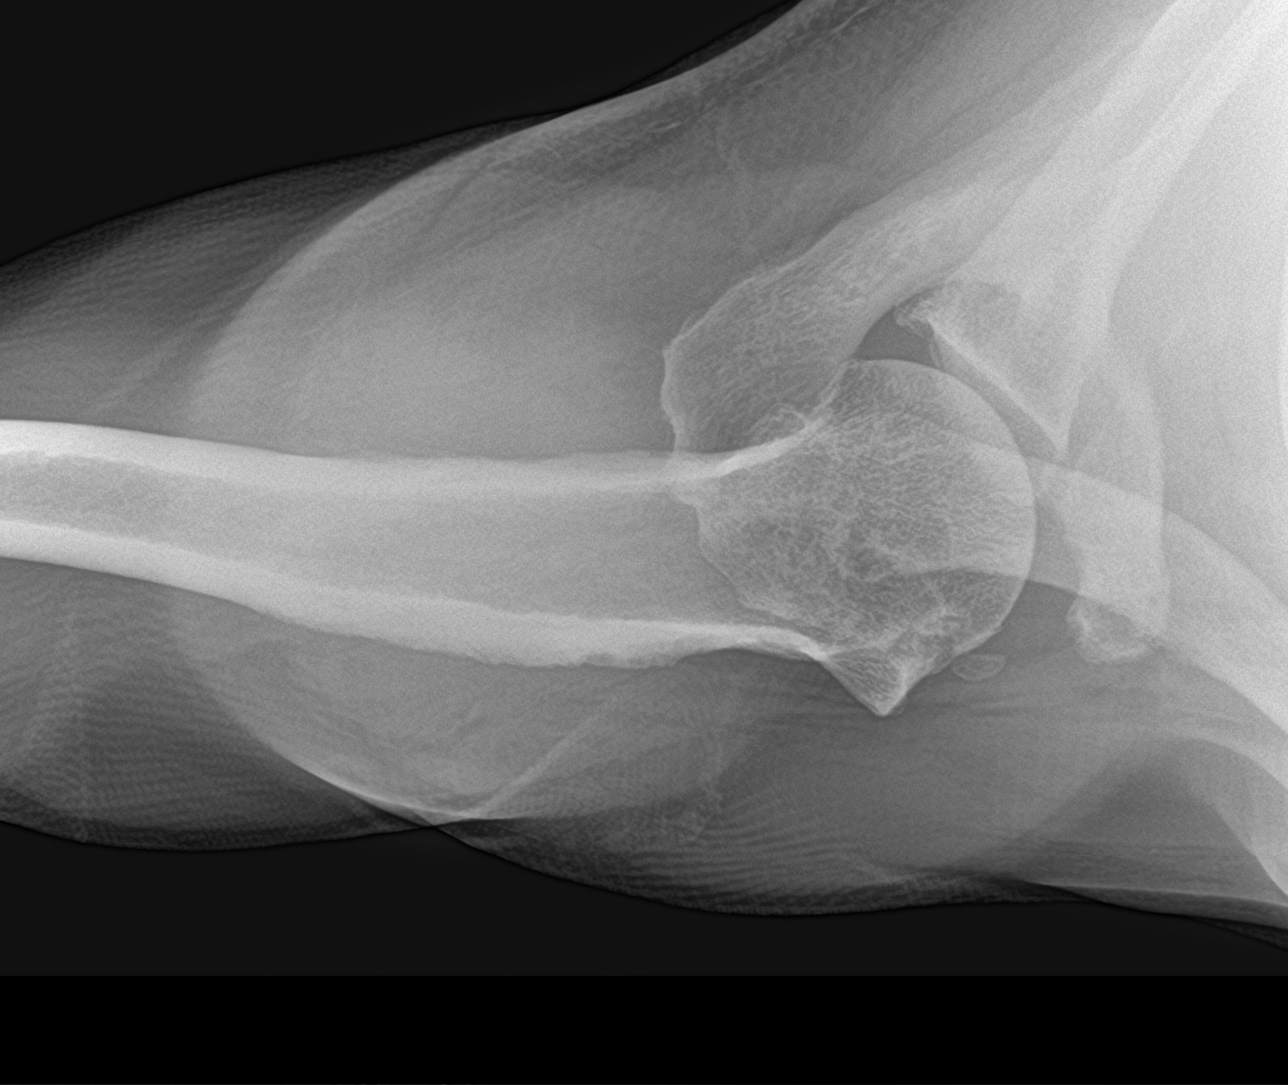

[3 of 3 positions shown; findings below may reference images not displayed]

FINDINGS: Left shoulder: Normal alignment. No fracture or focal osseous
lesion. Greater tuberosity prominence and irregularity. Glenohumeral
and acromioclavicular osteophytosis with mild joint space loss. Soft
tissues within normal limits.

Right shoulder: Normal alignment. Glenohumeral and acromioclavicular
joint space loss and osteophytosis. No fracture or focal osseous
lesion. Soft tissues within normal limits.
IMPRESSION: No acute osseous abnormality.  Mild-to-moderate osteoarthrosis.

Left greater tuberosity irregularity is suspicious for supraspinatus
tendinopathy.

## 2021-05-27 ENCOUNTER — Other Ambulatory Visit: Payer: Self-pay

## 2021-06-02 ENCOUNTER — Other Ambulatory Visit: Payer: Self-pay

## 2021-06-02 ENCOUNTER — Ambulatory Visit: Payer: Medicare PPO | Admitting: Orthopaedic Surgery

## 2021-06-02 ENCOUNTER — Ambulatory Visit: Payer: Medicare PPO | Admitting: Nurse Practitioner

## 2021-06-02 DIAGNOSIS — M25511 Pain in right shoulder: Secondary | ICD-10-CM | POA: Diagnosis not present

## 2021-06-02 DIAGNOSIS — G8929 Other chronic pain: Secondary | ICD-10-CM | POA: Diagnosis not present

## 2021-06-02 DIAGNOSIS — G5693 Unspecified mononeuropathy of bilateral upper limbs: Secondary | ICD-10-CM | POA: Diagnosis not present

## 2021-06-02 MED ORDER — MELOXICAM 7.5 MG PO TABS
7.5000 mg | ORAL_TABLET | Freq: Every day | ORAL | 1 refills | Status: DC | PRN
  Filled 2021-06-02: qty 30, 30d supply, fill #0

## 2021-06-02 MED ORDER — METHYLPREDNISOLONE 4 MG PO TABS
ORAL_TABLET | ORAL | 0 refills | Status: DC
  Filled 2021-06-02: qty 21, 6d supply, fill #0

## 2021-06-02 MED ORDER — METHYLPREDNISOLONE ACETATE 40 MG/ML IJ SUSP
40.0000 mg | INTRAMUSCULAR | Status: AC | PRN
  Administered 2021-06-02: 40 mg via INTRA_ARTICULAR

## 2021-06-02 MED ORDER — LIDOCAINE HCL 1 % IJ SOLN
3.0000 mL | INTRAMUSCULAR | Status: AC | PRN
  Administered 2021-06-02: 3 mL

## 2021-06-02 NOTE — Progress Notes (Signed)
Office Visit Note   Patient: Daniel Haynes           Date of Birth: 1950/11/16           MRN: [id number] Visit Date: 06/02/2021              Requested by: Vevelyn Francois, NP [address] [place],  [place] [address] PCP: Vevelyn Francois, NP   Assessment & Plan: Visit Diagnoses:  1. Bilateral neuropathy of upper extremities   2. Chronic right shoulder pain     Plan: I did recommend a steroid injection in the right shoulder subacromial space because this is the more symptomatic shoulder.  We need to send her to outpatient physical therapy to work on bilateral upper extremity range of motion and strengthening especially of the shoulders with any modalities to decrease his pain per the therapist discretion.  We will also need to obtain nerve conduction studies of the bilateral upper extremities to rule out carpal tunnel syndrome and to assess if there is any degree of nerve compression from the neck.  We will try a 6-day steroid taper can once a day 7.5 mg meloxicam.  He is already on Neurontin and methocarbamol.  All questions and concerns were answered addressed.  I will see him back in 6 weeks.  Follow-Up Instructions: Return in about 6 weeks (around 07/14/2021).   Orders:  Orders Placed This Encounter  Procedures   Large Joint Inj   Meds ordered this encounter  Medications   methylPREDNISolone (MEDROL) 4 MG tablet    Sig: Medrol dose pack. Take as instructed    Dispense:  21 tablet    Refill:  0   meloxicam (MOBIC) 7.5 MG tablet    Sig: Take 1 tablet (7.5 mg total) by mouth daily as needed for pain.    Dispense:  30 tablet    Refill:  1      Procedures: Large Joint Inj: R subacromial bursa on 06/02/2021 8:36 AM Indications: pain and diagnostic evaluation Details: 22 G 1.5 in needle  Arthrogram: No  Medications: 3 mL lidocaine 1 %; 40 mg methylPREDNISolone acetate 40 MG/ML Outcome: tolerated well, no immediate complications Procedure, treatment alternatives, risks and  benefits explained, specific risks discussed. Consent was given by the patient. Immediately prior to procedure a time out was called to verify the correct patient, procedure, equipment, support staff and site/side marked as required. Patient was prepped and draped in the usual sterile fashion.      Clinical Data: No additional findings.   Subjective: Chief Complaint  Patient presents with   Right Shoulder - Pain  The patient comes in today as a referral [facility] to evaluate and treat bilateral shoulder pain but he is also having bilateral hand stiffness with numbness and tingling.  He is 70 years old.  He does have chronic renal disease.  He is not a diabetic.  He had x-rays of his neck and his shoulders done earlier this year.  He wears splints on both his hands.  He does have a flareup of gout at times he states.  He says he cannot raise his arms above his head.  He is also lost 40 pounds in about 2 months.  This is being followed by [facility].  He has never had surgery on his shoulders.  He does have a history of cervical spine surgery from with an anterior cervical discectomy and fusion from  C3-C6.  HPI  Review of Systems There is currently listed no headache, chest pain, shortness of breath, fever, chills, nausea, vomiting  Objective: Vital Signs: There were no vitals taken for this visit.  Physical Exam He is alert and orient x3 and in no acute distress Ortho Exam He does have limited lateral rotation and bending of the cervical spine as well as flexion and extension.  He has weak grip strength bilaterally.  He is having difficulty with abduction past 90 degrees of both shoulders and there is definitely some grinding at the [facility] joint of both shoulders but not the glenohumeral joint.  Both shoulders are well located. Specialty Comments:  No specialty comments available.  Imaging: No results found. 3 views of both  shoulders were independent reviewed from earlier this year and showed no acute findings.  There is some mild arthritic changes.  Both shoulders are well located.  X-rays of the cervical spine show an anterior cervical discectomy and fusion between C3 and C6.  PMFS History: Patient Active Problem List   Diagnosis Date Noted   Chronic kidney disease (CKD) stage G3a/A1, moderately decreased glomerular filtration rate (GFR) between 45-59 mL/min/1.73 square meter and albuminuria creatinine ratio less than 30 mg/g ([place]) 06/06/2020   Urinary frequency 07/31/2017   Accelerated hypertension 02/17/2016   Frequency of urination 02/17/2016   Gout 02/17/2016   Past Medical History:  Diagnosis Date   Arthritis    hands fingers wrists    Gout    Hyperlipidemia    Hypertension    Insomnia 08/2019    Family History  Problem Relation Age of Onset   Cancer Mother    Cancer Father    Colon cancer Neg Hx    Colon polyps Neg Hx    Esophageal cancer Neg Hx    Rectal cancer Neg Hx    Stomach cancer Neg Hx     Past Surgical History:  Procedure Laterality Date   CERVICAL DISCECTOMY  2004   [facility]   Social History   Occupational History   Not on file  Tobacco Use   Smoking status: Former   Smokeless tobacco: Never  Substance and Sexual Activity   Alcohol use: Yes    Alcohol/week: 21.0 standard drinks    Types: 21 Shots of liquor per week    Comment: 3 a day    Drug use: Yes    Types: Marijuana    Comment: occ use    Sexual activity: Not on file

## 2021-06-03 ENCOUNTER — Other Ambulatory Visit: Payer: Self-pay

## 2021-06-16 ENCOUNTER — Encounter: Payer: Self-pay | Admitting: Physical Therapy

## 2021-06-16 ENCOUNTER — Ambulatory Visit (INDEPENDENT_AMBULATORY_CARE_PROVIDER_SITE_OTHER): Payer: Medicare PPO | Admitting: Physical Therapy

## 2021-06-16 ENCOUNTER — Other Ambulatory Visit: Payer: Self-pay

## 2021-06-16 DIAGNOSIS — M6281 Muscle weakness (generalized): Secondary | ICD-10-CM | POA: Diagnosis not present

## 2021-06-16 DIAGNOSIS — R293 Abnormal posture: Secondary | ICD-10-CM

## 2021-06-16 DIAGNOSIS — M25511 Pain in right shoulder: Secondary | ICD-10-CM

## 2021-06-16 DIAGNOSIS — M25512 Pain in left shoulder: Secondary | ICD-10-CM

## 2021-06-16 DIAGNOSIS — R29818 Other symptoms and signs involving the nervous system: Secondary | ICD-10-CM

## 2021-06-16 NOTE — Patient Instructions (Signed)
Access Code: [email or web address] URL: [email or web address] Date: 06/16/2021 Prepared by: Faustino Congress  Exercises Towel Roll Squeeze - 3 x daily - 7 x weekly - 15 reps - 1 sets - 5 sec hold Thumb Opposition - 2 x daily - 7 x weekly - 1-2 sets - 10 reps Supine Shoulder Flexion with Dowel - 2 x daily - 7 x weekly - 10 reps - 1 sets - 3-5 sec hold Supine Shoulder External Rotation AAROM with Dowel - 2 x daily - 7 x weekly - 1 sets - 10 reps Standing Shoulder Abduction AAROM with Dowel - 2 x daily - 7 x weekly - 1 sets - 10 reps

## 2021-06-16 NOTE — Therapy (Addendum)
[facility] Physical Therapy [address] [place], [place], [address] Phone: [phone]   Fax:  [phone]  Physical Therapy Evaluation  Patient Details  Name: Daniel Haynes MRN: [id number] Date of Birth: September 17, 1951 Referring Provider (PT): Mcarthur Rossetti, MD   Encounter Date: 06/16/2021  Referring diagnosis? G56.93, M25.511,G89.29 Treatment diagnosis? (if different than referring diagnosis) M25.511, M25.512, R29.3, M62.81, R29.818 What was this (referring dx) caused by? '[]'$  Surgery '[]'$  Fall '[x]'$  Ongoing issue '[x]'$  Arthritis '[]'$  Other: ____________  Laterality: '[]'$  Rt '[]'$  Lt '[x]'$  Both  Check all possible CPT codes:      '[]'$  97110 (Therapeutic Exercise)  '[]'$  92507 (SLP Treatment)  '[]'$  97112 (Neuro Re-ed)   '[]'$  92526 (Swallowing Treatment)   '[]'$  97116 (Gait Training)   '[]'$  [id number] (Cognitive Training, 1st 15 minutes) '[]'$  97140 (Manual Therapy)   '[]'$  40981 (Cognitive Training, each add'l 15 minutes)  '[]'$  97530 (Therapeutic Activities)  '[]'$  Other, List CPT Code ____________    '[]'$  [id number] (Self Care)       '[x]'$  All codes above (97110 - [id number])  '[x]'$  97012 (Mechanical Traction)  '[x]'$  97014 (E-stim Unattended)  '[]'$  97032 (E-stim manual)  '[]'$  97033 (Ionto)  '[]'$  97035 (Ultrasound)  '[]'$  97760 (Orthotic Fit) '[x]'$  97750 (Physical Performance Training) '[]'$  [id number] (Aquatic Therapy) '[]'$  97034 (Contrast Bath) '[]'$  [id number] (Paraffin) '[]'$  97597 (Wound Care 1st 20 sq cm) '[]'$  97598 (Wound Care each add'l 20 sq cm) '[]'$  97016 (Vasopneumatic Device) '[]'$  [id number] Comptroller) '[]'$  [id number] (Prosthetic Training)    PT End of Session - 06/16/21 1114     Visit Number 1    Number of Visits 6    Date for PT Re-Evaluation 07/28/21    Authorization Type [employer] $10 copay    Progress Note Due on Visit 10    PT Start Time 1021    PT Stop Time 1057    PT Time Calculation (min) 36 min    Activity Tolerance Patient tolerated treatment well    Behavior During Therapy [facility] for tasks assessed/performed              Past Medical History:  Diagnosis Date   Arthritis    hands fingers wrists    Gout    Hyperlipidemia    Hypertension    Insomnia 08/2019    Past Surgical History:  Procedure Laterality Date   CERVICAL DISCECTOMY  2004   [facility]    There were no vitals filed for this visit.    Subjective Assessment - 06/16/21 1023     Subjective Pt is a 70 y/o male who presents to [facility] for bil shoulder pain and decreased function x 3 months without known injury.  He had Rt shoulder cortisone injection and feels he's 100% better now.  He does reports some occasional soreness at night.  He also reports swelling, pain, and tingling in bil hands as well - and has NCV/EMG ordered but not yet scheduled.    Pertinent History ACDF C3-6, OA, gout, CKD stage 3, HTN    Patient Stated Goals "I don't know.  I just came because they told me to come."    Currently in Pain? Yes    Pain Score 0-No pain   up to 7-8/10   Pain Location Shoulder    Pain Orientation Right;Left    Pain Descriptors / Indicators Tightness;Tingling;Aching    Pain Type Chronic pain    Pain Radiating Towards bil hands, Rt >Lt    Pain Onset More than a month ago  Pain Frequency Intermittent    Aggravating Factors  occupation (driving, manipulation of w/cs)    Pain Relieving Factors injection, sit with hand pronated and fingers stretched, heat                [facility] PT Assessment - 06/16/21 1030       Assessment   Medical Diagnosis G56.93 (ICD-10-CM) - Bilateral neuropathy of upper extremities  M25.511,G89.29 (ICD-10-CM) - Chronic right shoulder pain    Referring Provider (PT) Mcarthur Rossetti, MD    Onset Date/Surgical Date --   3 months   Hand Dominance Right    Next MD Visit 07/14/21    Prior Therapy none      Precautions   Precautions None      Restrictions   Weight Bearing Restrictions No      Balance Screen   Has the patient fallen in the past 6 months No    Has the patient had a decrease in  activity level because of a fear of falling?  No    Is the patient reluctant to leave their home because of a fear of falling?  No      Home Environment   Living Environment Private residence    Living Arrangements Children   independent adult son   Type of [place] One level    Additional Comments no difficulty with ADLs - does report some difficulty with fine motor tasks (buttons, etc)      Prior Function   Level of Independence Independent    Vocation Part time employment    Vocation Requirements medical transportation - driving, operating lift with buttons, push w/c    Leisure watching TV, road bike; has gym membership but hasn't been recently      Cognition   Overall Cognitive Status Within Functional Limits for tasks assessed      Observation/Other Assessments   Focus on Therapeutic Outcomes (FOTO)  did not complete      Posture/Postural Control   Posture/Postural Control Postural limitations    Postural Limitations Rounded Shoulders;Forward head      ROM / Strength   AROM / PROM / Strength AROM;Strength      AROM   Overall AROM Comments bil wrist flex and extension limited 50-60% with hard end feels noted    AROM Assessment Site Shoulder    Right/Left Shoulder Right;Left    Right Shoulder Flexion 115 Degrees    Right Shoulder ABduction 104 Degrees    Right Shoulder Internal Rotation --   FIR to Rt iliac crest   Right Shoulder External Rotation --   FER limited to occiput   Left Shoulder Flexion 115 Degrees    Left Shoulder ABduction 104 Degrees    Left Shoulder Internal Rotation --   FIR to L3/4   Left Shoulder External Rotation --   FER limited to occiput     Strength   Overall Strength Comments bil shoulders 3-/5    Strength Assessment Site Hand;Shoulder    Right/Left Forearm Right;Left    Right Forearm Pronation 5/5    Right Forearm Supination 5/5    Left Forearm Pronation 5/5    Left Forearm Supination 5/5    Right/Left Wrist  Right;Left    Right Wrist Flexion 5/5    Right Wrist Extension 5/5    Left Wrist Flexion 5/5    Left Wrist Extension 5/5    Right/Left hand Right;Left    Right Hand Grip (lbs) 39.4  avg    Left Hand Grip (lbs) 47.1 avg      Palpation   Palpation comment carpal compression test reproduced symptoms                        Objective measurements completed on examination: See above findings.       [facility] Adult PT Treatment/Exercise - 06/16/21 1030       Exercises   Exercises Other Exercises    Other Exercises  see pt instructions - pt performed 2-5 reps of each exercise with cues for technique                    PT Education - 06/16/21 1049     Education Details HEP    Person(s) Educated Patient    Methods Explanation;Demonstration;Handout    Comprehension Verbalized understanding;Returned demonstration;Need further instruction              PT Short Term Goals - 06/16/21 1122       PT SHORT TERM GOAL #1   Title independent with HEP    Time 3    Period Weeks    Status New    Target Date 07/07/21               PT Long Term Goals - 06/16/21 1122       PT LONG TERM GOAL #1   Title independent with final HEP    Time 6    Period Weeks    Status New    Target Date 07/28/21      PT LONG TERM GOAL #2   Title bil grip strength improved 10# for improved function    Time 6    Period Weeks    Status New    Target Date 07/28/21      PT LONG TERM GOAL #3   Title bil shoulder strength improved to at least 4/5 for improved function    Time 6    Period Weeks    Status New    Target Date 07/28/21      PT LONG TERM GOAL #4   Title bil shoulder flexion improved to at least 130 deg for improved function    Time 6    Period Weeks    Status New    Target Date 07/28/21      PT LONG TERM GOAL #5   Title report pain < 4/10 with sleep and daily activities for improved function    Time 6    Period Weeks    Status New    Target Date  07/28/21                    Plan - 06/16/21 1115     Clinical Impression Statement Pt is a 70 y/o male who presents to [place] for 3 month hx of bil shoulder pain as well as wrist pain and neuropathy without known injury, Rt worse than Lt.  Pt with significant improvement in Rt shoulder symptoms following injection.  He continues to demonstrate decreased strength and ROM as well as pain and neuropathy symptoms affecting functional mobility.  Pt will benefit from PT to address deficits listed.    Personal Factors and Comorbidities Comorbidity 3+    Comorbidities ACDF C3-6, OA, gout, CKD stage 3, HTN    Examination-Activity Limitations Reach Overhead;Sleep;Lift;Carry;Dressing;Hygiene/Grooming    Examination-Participation Restrictions Community Activity;Occupation;Driving;Meal Prep;Cleaning    Stability/Clinical Decision Making Evolving/Moderate complexity  Clinical Decision Making Moderate    Rehab Potential Fair    PT Frequency 1x / week    PT Duration 6 weeks    PT Treatment/Interventions ADLs/Self Care Home Management;Cryotherapy;Electrical Stimulation;Moist Heat;Traction;Functional mobility training;Therapeutic activities;Therapeutic exercise;Neuromuscular re-education;Patient/family education;Manual techniques;Passive range of motion;Dry needling;Taping;Vasopneumatic Device    PT Next Visit Plan review HEP and continue with strengthening/ROM exercises as able, ?try traction, wrist stretches    PT Home Exercise Plan Access Code: [email or web address]    Consulted and Agree with Plan of Care Patient             Patient will benefit from skilled therapeutic intervention in order to improve the following deficits and impairments:  Pain, Postural dysfunction, Increased fascial restricitons, Decreased range of motion, Decreased strength, Impaired UE functional use, Impaired sensation  Visit Diagnosis: Acute pain of right shoulder - Plan: PT plan of care cert/re-cert  Acute pain of left  shoulder - Plan: PT plan of care cert/re-cert  Abnormal posture - Plan: PT plan of care cert/re-cert  Muscle weakness (generalized) - Plan: PT plan of care cert/re-cert  Other symptoms and signs involving the nervous system - Plan: PT plan of care cert/re-cert     Problem List Patient Active Problem List   Diagnosis Date Noted   Chronic kidney disease (CKD) stage G3a/A1, moderately decreased glomerular filtration rate (GFR) between 45-59 mL/min/1.73 square meter and albuminuria creatinine ratio less than 30 mg/g ([facility]) 06/06/2020   Urinary frequency 07/31/2017   Accelerated hypertension 02/17/2016   Frequency of urination 02/17/2016   Gout 02/17/2016      Laureen Abrahams, PT, DPT 06/16/21 11:27 AM     [facility] [address] [place], [place], [address] Phone: [phone]   Fax:  [phone]  Name: Aroldo Rokusek MRN: [id number] Date of Birth: [birth year]

## 2021-06-24 ENCOUNTER — Other Ambulatory Visit: Payer: Self-pay

## 2021-06-25 ENCOUNTER — Other Ambulatory Visit: Payer: Self-pay

## 2021-06-30 ENCOUNTER — Ambulatory Visit: Payer: Medicare PPO | Admitting: Physical Therapy

## 2021-06-30 ENCOUNTER — Other Ambulatory Visit: Payer: Self-pay

## 2021-06-30 DIAGNOSIS — M25512 Pain in left shoulder: Secondary | ICD-10-CM

## 2021-06-30 DIAGNOSIS — M6281 Muscle weakness (generalized): Secondary | ICD-10-CM | POA: Diagnosis not present

## 2021-06-30 DIAGNOSIS — R293 Abnormal posture: Secondary | ICD-10-CM

## 2021-06-30 DIAGNOSIS — M25511 Pain in right shoulder: Secondary | ICD-10-CM | POA: Diagnosis not present

## 2021-06-30 NOTE — Therapy (Signed)
[facility] Physical Therapy [address] [place], [place], [address] Phone: [phone]   Fax:  [phone]  Physical Therapy Treatment  Patient Details  Name: Daniel Haynes MRN: [id number] Date of Birth: 03-11-1951 Referring Provider (PT): Mcarthur Rossetti, MD   Encounter Date: 06/30/2021   PT End of Session - 06/30/21 1025     Visit Number 2    Number of Visits 6    Date for PT Re-Evaluation 07/28/21    Authorization Type [employer] $10 copay    Progress Note Due on Visit 10    PT Start Time 0930    PT Stop Time 1012    PT Time Calculation (min) 42 min    Activity Tolerance Patient tolerated treatment well    Behavior During Therapy [facility] for tasks assessed/performed             Past Medical History:  Diagnosis Date   Arthritis    hands fingers wrists    Gout    Hyperlipidemia    Hypertension    Insomnia 08/2019    Past Surgical History:  Procedure Laterality Date   CERVICAL DISCECTOMY  2004   [facility]    There were no vitals filed for this visit.   Subjective Assessment - 06/30/21 0944     Subjective relays his shoulders feel better but his hand pain is bad at night with numbness and tingling in his fingers. He wants to know if there is any cream or anything he can use to help this.    Pertinent History ACDF C3-6, OA, gout, CKD stage 3, HTN    Patient Stated Goals "I don't know.  I just came because they told me to come."    Pain Onset More than a month ago               [facility] Adult PT Treatment/Exercise - 06/30/21 0001       Exercises   Exercises Shoulder      Shoulder Exercises: Seated   Other Seated Exercises griping green digi grip 25 X 2 bilat, red therabar X 20 ea for wrist pronation,supination, extension, flexion strengthening bilat    Other Seated Exercises carpal tunnel tendon glides 5 positions X 5 ea bilat      Shoulder Exercises: Pulleys   Flexion 2 minutes    ABduction 2 minutes      Shoulder Exercises:  ROM/Strengthening   UBE (Upper Arm Bike) 2.5 min fwd, 2.5 min retro L4      Manual Therapy   Manual therapy comments gentle cervical traction and suboccipital release 8 min total                       PT Short Term Goals - 06/16/21 1122       PT SHORT TERM GOAL #1   Title independent with HEP    Time 3    Period Weeks    Status New    Target Date 07/07/21               PT Long Term Goals - 06/16/21 1122       PT LONG TERM GOAL #1   Title independent with final HEP    Time 6    Period Weeks    Status New    Target Date 07/28/21      PT LONG TERM GOAL #2   Title bil grip strength improved 10# for improved function    Time 6  Period Weeks    Status New    Target Date 07/28/21      PT LONG TERM GOAL #3   Title bil shoulder strength improved to at least 4/5 for improved function    Time 6    Period Weeks    Status New    Target Date 07/28/21      PT LONG TERM GOAL #4   Title bil shoulder flexion improved to at least 130 deg for improved function    Time 6    Period Weeks    Status New    Target Date 07/28/21      PT LONG TERM GOAL #5   Title report pain < 4/10 with sleep and daily activities for improved function    Time 6    Period Weeks    Status New    Target Date 07/28/21                   Plan - 06/30/21 0948     Clinical Impression Statement I provided him with samples of biofreeze to try at home to see if this relieves any of his pain he has at night. I also trialed carpal tunnel tendon glides and gentle cervical traction today to see if this helps relieve any of the N/T in his hands/fingers. We will assess his response to this next visit.    Personal Factors and Comorbidities Comorbidity 3+    Comorbidities ACDF C3-6, OA, gout, CKD stage 3, HTN    Examination-Activity Limitations Reach Overhead;Sleep;Lift;Carry;Dressing;Hygiene/Grooming    Examination-Participation Restrictions Community Activity;Occupation;Driving;Meal  Prep;Cleaning    Stability/Clinical Decision Making Evolving/Moderate complexity    Rehab Potential Fair    PT Frequency 1x / week    PT Duration 6 weeks    PT Treatment/Interventions ADLs/Self Care Home Management;Cryotherapy;Electrical Stimulation;Moist Heat;Traction;Functional mobility training;Therapeutic activities;Therapeutic exercise;Neuromuscular re-education;Patient/family education;Manual techniques;Passive range of motion;Dry needling;Taping;Vasopneumatic Device    PT Next Visit Plan wrist strength, shoulder ROM, how was traction and tendon glides, and traction?    PT Home Exercise Plan Access Code: [email or web address], added Carpal tunnel tendon glide    Consulted and Agree with Plan of Care Patient             Patient will benefit from skilled therapeutic intervention in order to improve the following deficits and impairments:  Pain, Postural dysfunction, Increased fascial restricitons, Decreased range of motion, Decreased strength, Impaired UE functional use, Impaired sensation  Visit Diagnosis: Acute pain of right shoulder  Acute pain of left shoulder  Abnormal posture  Muscle weakness (generalized)     Problem List Patient Active Problem List   Diagnosis Date Noted   Chronic kidney disease (CKD) stage G3a/A1, moderately decreased glomerular filtration rate (GFR) between 45-59 mL/min/1.73 square meter and albuminuria creatinine ratio less than 30 mg/g ([facility]) 06/06/2020   Urinary frequency 07/31/2017   Accelerated hypertension 02/17/2016   Frequency of urination 02/17/2016   Gout 02/17/2016    Debbe Odea, PT,DPT 06/30/2021, 10:28 AM  [facility] Physical Therapy [address] [place], [place], [address] Phone: [phone]   Fax:  [phone]  Name: Daniel Haynes MRN: [id number] Date of Birth: 19-Jul-1951

## 2021-07-04 ENCOUNTER — Telehealth: Payer: Self-pay

## 2021-07-04 NOTE — Telephone Encounter (Signed)
Called pt to schedule AWV, no answer, LVMTRC. 

## 2021-07-07 ENCOUNTER — Telehealth: Payer: Self-pay | Admitting: Physical Therapy

## 2021-07-07 ENCOUNTER — Encounter: Payer: Medicare PPO | Admitting: Physical Therapy

## 2021-07-07 NOTE — Telephone Encounter (Signed)
Pt no show for PT appointment today. They were contacted and informed of this via voicemail. They were provided the date and time of their next appointment on voicemail. They were instructed to call us to let us know if they cannot make their appointment.  Elsie Ra, PT, DPT 07/07/21 9:35 AM

## 2021-07-11 ENCOUNTER — Telehealth: Payer: Self-pay

## 2021-07-11 ENCOUNTER — Other Ambulatory Visit: Payer: Self-pay | Admitting: Nurse Practitioner

## 2021-07-11 ENCOUNTER — Telehealth: Payer: Self-pay | Admitting: Nurse Practitioner

## 2021-07-11 DIAGNOSIS — M5431 Sciatica, right side: Secondary | ICD-10-CM

## 2021-07-11 NOTE — Telephone Encounter (Signed)
All med's need to refilled

## 2021-07-11 NOTE — Telephone Encounter (Signed)
LVM for pt to rtn my call to [phone] to schedule AWV. Please schedule this appt if pt calls the office.   Thanks

## 2021-07-11 NOTE — Telephone Encounter (Signed)
Left voicemail to call back to clarify refills.

## 2021-07-11 NOTE — Telephone Encounter (Signed)
He has refills on everything except gabapentin and I am not even sure if he is taking this.  Clarify

## 2021-07-14 ENCOUNTER — Ambulatory Visit: Payer: Medicare PPO | Admitting: Orthopaedic Surgery

## 2021-07-14 ENCOUNTER — Encounter: Payer: Medicare PPO | Admitting: Physical Therapy

## 2021-07-21 ENCOUNTER — Other Ambulatory Visit: Payer: Self-pay

## 2021-07-21 ENCOUNTER — Encounter: Payer: Self-pay | Admitting: Physician Assistant

## 2021-07-21 ENCOUNTER — Ambulatory Visit (INDEPENDENT_AMBULATORY_CARE_PROVIDER_SITE_OTHER): Payer: Medicare PPO | Admitting: Physician Assistant

## 2021-07-21 ENCOUNTER — Ambulatory Visit (INDEPENDENT_AMBULATORY_CARE_PROVIDER_SITE_OTHER): Payer: Medicare PPO | Admitting: Physical Therapy

## 2021-07-21 DIAGNOSIS — G5693 Unspecified mononeuropathy of bilateral upper limbs: Secondary | ICD-10-CM

## 2021-07-21 DIAGNOSIS — M25512 Pain in left shoulder: Secondary | ICD-10-CM

## 2021-07-21 DIAGNOSIS — M6281 Muscle weakness (generalized): Secondary | ICD-10-CM | POA: Diagnosis not present

## 2021-07-21 DIAGNOSIS — R293 Abnormal posture: Secondary | ICD-10-CM | POA: Diagnosis not present

## 2021-07-21 DIAGNOSIS — G8929 Other chronic pain: Secondary | ICD-10-CM

## 2021-07-21 DIAGNOSIS — R29818 Other symptoms and signs involving the nervous system: Secondary | ICD-10-CM

## 2021-07-21 DIAGNOSIS — M25511 Pain in right shoulder: Secondary | ICD-10-CM

## 2021-07-21 NOTE — Therapy (Addendum)
[facility] Physical Therapy [address] [place], [place], [address] Phone: [phone]   Fax:  [phone]  Physical Therapy Treatment/Discharge addendum PHYSICAL THERAPY DISCHARGE SUMMARY  Visits from Start of Care: 3  Current functional level related to goals / functional outcomes: See below   Remaining deficits: See below   Education / Equipment: HEP  Plan: Patient goals were not partially met. Patient is being discharged due to not returning since last visit  Elsie Ra, PT, DPT 10/09/21 3:09 PM     Patient Details  Name: Daniel Haynes MRN: [id number] Date of Birth: [birth year] Referring Provider (PT): Mcarthur Rossetti, MD   Encounter Date: 07/21/2021   PT End of Session - 07/21/21 0824     Visit Number 3    Number of Visits 6    Date for PT Re-Evaluation 07/28/21    Authorization Type [employer] $10 copay    Progress Note Due on Visit 10    PT Start Time 0806    PT Stop Time 0845    PT Time Calculation (min) 39 min    Activity Tolerance Patient tolerated treatment well    Behavior During Therapy [facility] for tasks assessed/performed             Past Medical History:  Diagnosis Date   Arthritis    hands fingers wrists    Gout    Hyperlipidemia    Hypertension    Insomnia 08/2019    Past Surgical History:  Procedure Laterality Date   CERVICAL DISCECTOMY  2004   [facility]    There were no vitals filed for this visit.   Subjective Assessment - 07/21/21 0812     Subjective relays he feels pretty good today.He still has some numbness in his fingers but this has improved overall and the exercises are helping.    Pertinent History ACDF C3-6, OA, gout, CKD stage 3, HTN    Patient Stated Goals "I don't know.  I just came because they told me to come."    Pain Onset More than a month ago                [facility] PT Assessment - 07/21/21 0001       Assessment   Medical Diagnosis G56.93 (ICD-10-CM) - Bilateral neuropathy of  upper extremities  M25.511,G89.29 (ICD-10-CM) - Chronic right shoulder pain    Referring Provider (PT) Mcarthur Rossetti, MD      AROM   Right Shoulder Flexion 120 Degrees    Right Shoulder ABduction 115 Degrees    Left Shoulder Flexion 120 Degrees    Left Shoulder ABduction 115 Degrees      Strength   Overall Strength Comments bilat shoulder strength 4+/5 grossly    Right Hand Grip (lbs) 52 lb    Left Hand Grip (lbs) 53 lb                           [facility] Adult PT Treatment/Exercise - 07/21/21 0001       Shoulder Exercises: Seated   Other Seated Exercises griping blue digi grip 25 X 2 bilat      Shoulder Exercises: Pulleys   Flexion 2 minutes    ABduction 2 minutes      Modalities   Modalities Traction      Traction   Type of Traction Cervical    Min (lbs) 10    Max (lbs) 15    Hold Time 60  Rest Time 20    Time 15 min 4 step program                       PT Short Term Goals - 07/21/21 0825       PT SHORT TERM GOAL #1   Title independent with HEP    Baseline now met 10/3    Time 3    Period Weeks    Status Achieved    Target Date 07/07/21               PT Long Term Goals - 07/21/21 0826       PT LONG TERM GOAL #1   Title independent with final HEP    Baseline met for initial HEP but will likely progress this    Time 6    Period Weeks    Status On-going      PT LONG TERM GOAL #2   Title bil grip strength improved 10# for improved function    Baseline met for left hand, Rt hand has improved but not quite 10#    Time 6    Period Weeks    Status On-going      PT LONG TERM GOAL #3   Title bil shoulder strength improved to at least 4/5 for improved function    Baseline Met 10/3    Time 6    Period Weeks    Status Achieved      PT LONG TERM GOAL #4   Title bil shoulder flexion improved to at least 130 deg for improved function    Baseline 120 deg now on 10/3    Time 6    Period Weeks    Status On-going       PT LONG TERM GOAL #5   Title report pain < 4/10 with sleep and daily activities for improved function    Baseline met somedays with this    Time 6    Period Weeks    Status On-going                   Plan - 07/21/21 8270     Clinical Impression Statement He has made good overall progess with PT despite not coming in very often. This was his 3rd overall visit but now he has overall less pain and numbness/tingling in his hands and has improved his overall grip and shoulder strength. He has also improved his shoulder AROM as well. He does still have signs of carpal tunnel syndrome and he was been doing his exercises with this which appear to be helping. At this point I would recommend to continue PT for a few more visits as he still has mild deficits in the areas listed. He will follow up with MD today and then if he decides he wants more PT he can set this up then.    Personal Factors and Comorbidities Comorbidity 3+    Comorbidities ACDF C3-6, OA, gout, CKD stage 3, HTN    Examination-Activity Limitations Reach Overhead;Sleep;Lift;Carry;Dressing;Hygiene/Grooming    Examination-Participation Restrictions Community Activity;Occupation;Driving;Meal Prep;Cleaning    Stability/Clinical Decision Making Evolving/Moderate complexity    Rehab Potential Fair    PT Frequency 1x / week    PT Duration 6 weeks    PT Treatment/Interventions ADLs/Self Care Home Management;Cryotherapy;Electrical Stimulation;Moist Heat;Traction;Functional mobility training;Therapeutic activities;Therapeutic exercise;Neuromuscular re-education;Patient/family education;Manual techniques;Passive range of motion;Dry needling;Taping;Vasopneumatic Device    PT Next Visit Plan wrist strength, shoulder ROM, how was mechanical traction?  PT Home Exercise Plan Access Code: [email or web address], added Carpal tunnel tendon glide    Consulted and Agree with Plan of Care Patient             Patient will benefit from skilled  therapeutic intervention in order to improve the following deficits and impairments:  Pain, Postural dysfunction, Increased fascial restricitons, Decreased range of motion, Decreased strength, Impaired UE functional use, Impaired sensation  Visit Diagnosis: Acute pain of right shoulder  Acute pain of left shoulder  Abnormal posture  Muscle weakness (generalized)  Other symptoms and signs involving the nervous system     Problem List Patient Active Problem List   Diagnosis Date Noted   Chronic kidney disease (CKD) stage G3a/A1, moderately decreased glomerular filtration rate (GFR) between 45-59 mL/min/1.73 square meter and albuminuria creatinine ratio less than 30 mg/g (HCC) 06/06/2020   Urinary frequency 07/31/2017   Accelerated hypertension 02/17/2016   Frequency of urination 02/17/2016   Gout 02/17/2016    Debbe Odea, PT,DPT 07/21/2021, 8:48 AM  [facility] Physical Therapy [address] [place], [place], [address] Phone: [phone]   Fax:  [phone]  Name: Daniel Haynes MRN: [id number] Date of Birth: Jun 17, 1951

## 2021-07-21 NOTE — Progress Notes (Signed)
HPI: [name] returns today for follow-up of bilateral hand numbness tingling that also bilateral shoulder pain.  He feels that physical therapy helped with his shoulders and also the injection of the right shoulder subacromial help with the pain.  He is having no problems with the shoulders at this point time.  Regards to numbness tingling both hands still having numbness tingling both hands.  His hands do tingle and particularly at the thumbs bilaterally.  He has recently been taken off his Neurontin due to the fact that he did not feel it was helping much.  He is asking about lidocaine injections both hands for the numbness tingling.  He did not undergo EMG nerve conduction studies as of yet for the upper extremities.  Review of systems: See HPI otherwise negative  Physical exam: General well-developed well-nourished male no acute distress mood affect appropriate  Bilateral shoulders: 5-5 strength external/internal rotation against resistance empty can test is negative bilaterally impingement testing is negative bilaterally. Bilateral hands subjective full sensation light touch throughout both hands except for the right thumb which he has subjective decreased sensation.  Positive Tinel's over the median nerve at the wrist bilaterally.  Negative Phalen's bilaterally.  Impression: Bilateral shoulder pain improved Bilateral hand neuropathy  Plan: We will set him up for EMG nerve conduction studies of upper extremities have him back once these are available.  In regards to the shoulders he will continue his home exercise program as taught by therapy.  Questions were encouraged and answered at length today.  He was scheduled for EMG nerve conduction studies today while in the office.  These will be performed on October 17.

## 2021-07-23 ENCOUNTER — Telehealth: Payer: Self-pay

## 2021-07-23 NOTE — Telephone Encounter (Signed)
Left voicemail to call back to clarify refills.

## 2021-07-23 NOTE — Telephone Encounter (Signed)
All med's to be refill   [facility]

## 2021-07-25 NOTE — Telephone Encounter (Signed)
Left voice mail to call back 

## 2021-07-28 ENCOUNTER — Ambulatory Visit: Payer: Medicare PPO | Admitting: Nurse Practitioner

## 2021-07-28 ENCOUNTER — Other Ambulatory Visit: Payer: Self-pay

## 2021-07-28 ENCOUNTER — Encounter: Payer: Self-pay | Admitting: Nurse Practitioner

## 2021-07-28 VITALS — BP 178/87 | HR 51 | Temp 97.0°F | Ht 66.0 in | Wt 153.0 lb

## 2021-07-28 DIAGNOSIS — E785 Hyperlipidemia, unspecified: Secondary | ICD-10-CM | POA: Diagnosis not present

## 2021-07-28 DIAGNOSIS — N1831 Chronic kidney disease, stage 3a: Secondary | ICD-10-CM | POA: Diagnosis not present

## 2021-07-28 DIAGNOSIS — M109 Gout, unspecified: Secondary | ICD-10-CM

## 2021-07-28 DIAGNOSIS — R351 Nocturia: Secondary | ICD-10-CM

## 2021-07-28 DIAGNOSIS — N401 Enlarged prostate with lower urinary tract symptoms: Secondary | ICD-10-CM | POA: Diagnosis not present

## 2021-07-28 DIAGNOSIS — I10 Essential (primary) hypertension: Secondary | ICD-10-CM | POA: Diagnosis not present

## 2021-07-28 DIAGNOSIS — D508 Other iron deficiency anemias: Secondary | ICD-10-CM | POA: Diagnosis not present

## 2021-07-28 DIAGNOSIS — Z23 Encounter for immunization: Secondary | ICD-10-CM | POA: Diagnosis not present

## 2021-07-28 DIAGNOSIS — R350 Frequency of micturition: Secondary | ICD-10-CM

## 2021-07-28 DIAGNOSIS — G4701 Insomnia due to medical condition: Secondary | ICD-10-CM

## 2021-07-28 MED ORDER — HYDROCHLOROTHIAZIDE 25 MG PO TABS
ORAL_TABLET | Freq: Every day | ORAL | 3 refills | Status: DC
  Filled 2021-07-28: qty 30, 30d supply, fill #0
  Filled 2021-09-03: qty 30, 30d supply, fill #1
  Filled 2021-10-08: qty 30, 30d supply, fill #2

## 2021-07-28 MED ORDER — AMLODIPINE BESYLATE 10 MG PO TABS
ORAL_TABLET | Freq: Every day | ORAL | 3 refills | Status: DC
  Filled 2021-07-28: qty 30, 30d supply, fill #0
  Filled 2021-09-16: qty 30, 30d supply, fill #1
  Filled 2021-10-23: qty 30, 30d supply, fill #2
  Filled 2021-10-23: qty 30, 30d supply, fill #0
  Filled 2021-12-04: qty 30, 30d supply, fill #1
  Filled 2022-01-19: qty 30, 30d supply, fill #2
  Filled 2022-03-06: qty 30, 30d supply, fill #3
  Filled 2022-05-05: qty 30, 30d supply, fill #4
  Filled 2022-06-26: qty 30, 30d supply, fill #5

## 2021-07-28 MED ORDER — LISINOPRIL 20 MG PO TABS
ORAL_TABLET | Freq: Every day | ORAL | 3 refills | Status: DC
  Filled 2021-07-28: qty 30, 30d supply, fill #0
  Filled 2021-09-03: qty 30, 30d supply, fill #1
  Filled 2021-09-08: qty 30, 30d supply, fill #2

## 2021-07-28 MED ORDER — COLCHICINE 0.6 MG PO TABS
ORAL_TABLET | ORAL | 1 refills | Status: DC
  Filled 2021-07-28: qty 30, 10d supply, fill #0

## 2021-07-28 MED ORDER — TRAZODONE HCL 50 MG PO TABS
ORAL_TABLET | Freq: Every evening | ORAL | 3 refills | Status: DC | PRN
  Filled 2021-07-28: qty 30, 30d supply, fill #0

## 2021-07-28 MED ORDER — TAMSULOSIN HCL 0.4 MG PO CAPS
ORAL_CAPSULE | ORAL | 3 refills | Status: DC
  Filled 2021-07-28: qty 30, 30d supply, fill #0
  Filled 2021-10-01: qty 30, 30d supply, fill #1

## 2021-07-28 MED ORDER — FINASTERIDE 5 MG PO TABS
5.0000 mg | ORAL_TABLET | Freq: Every day | ORAL | 11 refills | Status: DC
Start: 2021-07-28 — End: 2022-11-18
  Filled 2021-07-28: qty 30, 30d supply, fill #0
  Filled 2021-09-08: qty 30, 30d supply, fill #1
  Filled 2021-10-23: qty 30, 30d supply, fill #2
  Filled 2021-10-23: qty 30, 30d supply, fill #0
  Filled 2021-12-04: qty 30, 30d supply, fill #1
  Filled 2022-01-19: qty 30, 30d supply, fill #2
  Filled 2022-03-06: qty 30, 30d supply, fill #3
  Filled 2022-05-05: qty 30, 30d supply, fill #4
  Filled 2022-06-26: qty 30, 30d supply, fill #5

## 2021-07-28 MED ORDER — PRAVASTATIN SODIUM 40 MG PO TABS
ORAL_TABLET | Freq: Every day | ORAL | 3 refills | Status: DC
Start: 2021-07-28 — End: 2022-08-19
  Filled 2021-07-28: qty 30, 30d supply, fill #0
  Filled 2021-09-08: qty 30, 30d supply, fill #1
  Filled 2021-10-23: qty 30, 30d supply, fill #0
  Filled 2021-10-23: qty 30, 30d supply, fill #2
  Filled 2021-12-04: qty 30, 30d supply, fill #1
  Filled 2022-01-19: qty 30, 30d supply, fill #2
  Filled 2022-03-06: qty 30, 30d supply, fill #3
  Filled 2022-05-05: qty 30, 30d supply, fill #4
  Filled 2022-06-26: qty 30, 30d supply, fill #5

## 2021-07-28 NOTE — Progress Notes (Signed)
Daniel Haynes, [place]  [address] Phone:  [phone]   Fax:  [phone]   Established Patient Office Visit  Subjective:  Patient ID: Daniel Haynes, male    DOB: 27-Jul-1951  Age: 70 y.o. MRN: [id number]  CC:  Chief Complaint  Patient presents with   Hypertension    Follow up;hypertension Pt states that he lost all his medications and is needing    HPI Daniel Haynes presents for follow up. He  has a past medical history of Arthritis, Gout, Hyperlipidemia, Hypertension, and Insomnia (08/2019).   He went on a trip and lost his medication. He has been out of medication for 2 weeks.  Loss of medications includes his amlodipine 10 mg, hydrochlorothiazide 25 mg along with the lisinopril 20 mg.  His blood pressure today is elevated 178/87.  Denies headache, dizziness, visual changes, shortness of breath, dyspnea on exertion, chest pain, nausea, vomiting.  He reports that his weight is getting stable and his appetite is better.   He is having a flareup right hand pain due to being off of his meloxicam. He reports following up with orthopedics.  He has completely therapy. He is awaiting an nerve conduction study.   He is also having a gout flare in his right foot and ankle.   He reports continued nocturia 6-7 time per night.  He has tried finasteride 5 mg and tamsulosin 0.4 mg.  He denies much of a change.  He was referred to urology in the past however is seeking a second opinion.  This visit did not as desired.  He does continue to work full-time.  He does use the trazodone 50 mg nightly as needed.  He feels like this is effective for insomnia.  His depression has improved.  He has a new grandchild in the home since his last visit. Past Medical History:  Diagnosis Date   Arthritis    hands fingers wrists    Gout    Hyperlipidemia    Hypertension    Insomnia 08/2019    Past Surgical History:  Procedure Laterality Date   CERVICAL DISCECTOMY   2004   [facility]    Family History  Problem Relation Age of Onset   Cancer Mother    Cancer Father    Colon cancer Neg Hx    Colon polyps Neg Hx    Esophageal cancer Neg Hx    Rectal cancer Neg Hx    Stomach cancer Neg Hx     Social History   Socioeconomic History   Marital status: Single    Spouse name: Not on file   Number of children: Not on file   Years of education: Not on file   Highest education level: Not on file  Occupational History   Not on file  Tobacco Use   Smoking status: Former   Smokeless tobacco: Never  Vaping Use   Vaping Use: Never used  Substance and Sexual Activity   Alcohol use: Yes    Alcohol/week: 21.0 standard drinks    Types: 21 Shots of liquor per week    Comment: 3 a day    Drug use: Yes    Types: Marijuana    Comment: occ use    Sexual activity: Not Currently  Other Topics Concern   Not on file  Social History Narrative   Not on file   Social Determinants of Health   Financial Resource Strain: Not on file  Food Insecurity: Not  on file  Transportation Needs: Not on file  Physical Activity: Not on file  Stress: Not on file  Social Connections: Not on file  Intimate Partner Violence: Not on file    Outpatient Medications Prior to Visit  Medication Sig Dispense Refill   meloxicam (MOBIC) 7.5 MG tablet Take 1 tablet (7.5 mg total) by mouth daily as needed for pain. 30 tablet 1   methylPREDNISolone (MEDROL) 4 MG tablet take 6 tablets by mouth day 1, 5 tabs day 2, 4 tabs day 3, 3 tabs day 4, 2 tabs day 5 and 1 tab day 6  as directed  21 tablet 0   amLODipine (NORVASC) 10 MG tablet TAKE 1 TABLET (10 MG TOTAL) BY MOUTH DAILY. 90 tablet 3   colchicine 0.6 MG tablet TAKE 2 TBLETS (1.2MG) BY MOUTH AT FIRST SIGN OF FLARE,FOLLOWED BY 1 TABLET (0.6MG) AFTER 1 HOUR 30 tablet 1   finasteride (PROSCAR) 5 MG tablet Take 1 tablet (5 mg total) by mouth daily. 30 tablet 11   hydrochlorothiazide (HYDRODIURIL) 25 MG tablet TAKE 1 TABLET (25 MG TOTAL) BY  MOUTH DAILY. 90 tablet 3   lisinopril (ZESTRIL) 20 MG tablet TAKE 1 TABLET (20 MG TOTAL) BY MOUTH DAILY. 90 tablet 3   pravastatin (PRAVACHOL) 40 MG tablet TAKE 1 TABLET (40 MG TOTAL) BY MOUTH DAILY. 90 tablet 3   tamsulosin (FLOMAX) 0.4 MG CAPS capsule TAKE 1 CAPSULE (0.4 MG TOTAL) BY MOUTH DAILY AFTER BREAKFAST. 90 capsule 3   traZODone (DESYREL) 50 MG tablet TAKE 1 TABLET (50 MG TOTAL) BY MOUTH AT BEDTIME AS NEEDED FOR SLEEP. 90 tablet 3   No facility-administered medications prior to visit.    No Known Allergies  ROS Review of Systems    Objective:    Physical Exam Constitutional:      General: He is not in acute distress.    Appearance: He is normal weight. He is not ill-appearing, toxic-appearing or diaphoretic.  HENT:     Head: Normocephalic and atraumatic.     Nose: Nose normal.     Mouth/Throat:     Mouth: Mucous membranes are moist.  Cardiovascular:     Rate and Rhythm: Normal rate and regular rhythm.     Pulses: Normal pulses.     Heart sounds: Normal heart sounds.  Pulmonary:     Effort: Pulmonary effort is normal.     Breath sounds: Normal breath sounds.  Abdominal:     Palpations: Abdomen is soft.  Musculoskeletal:        General: Swelling present.     Cervical back: Normal range of motion.     Right lower leg: No edema.     Left lower leg: No edema.     Comments: Shiftiness in his hand  Skin:    General: Skin is warm and dry.     Capillary Refill: Capillary refill takes less than 2 seconds.  Neurological:     General: No focal deficit present.     Mental Status: He is alert and oriented to person, place, and time.  Psychiatric:        Mood and Affect: Mood normal.        Behavior: Behavior normal.        Thought Content: Thought content normal.        Judgment: Judgment normal.    BP (!) 178/87   Pulse (!) 51   Temp (!) 97 F (36.1 C)   Ht '5\' 6"'  (1.676 m)  Wt 153 lb (69.4 kg)   SpO2 100%   BMI 24.69 kg/m  Wt Readings from Last 3  Encounters:  07/28/21 153 lb (69.4 kg)  05/05/21 152 lb 6.4 oz (69.1 kg)  02/03/21 164 lb (74.4 kg)     There are no preventive care reminders to display for this patient.   There are no preventive care reminders to display for this patient.  Lab Results  Component Value Date   TSH 0.744 08/25/2019   Lab Results  Component Value Date   WBC 6.8 12/23/2020   HGB 12.4 (L) 12/23/2020   HCT 35.8 (L) 12/23/2020   MCV 88 12/23/2020   PLT 340 12/23/2020   Lab Results  Component Value Date   NA 139 02/03/2021   K 4.2 02/03/2021   CO2 23 06/29/2017   GLUCOSE 125 (H) 02/03/2021   BUN 38 (H) 02/03/2021   CREATININE 1.96 (H) 02/03/2021   BILITOT <0.2 02/03/2021   ALKPHOS 139 (H) 02/03/2021   AST 12 02/03/2021   ALT 17 06/29/2017   PROT 6.7 02/03/2021   ALBUMIN 3.9 02/03/2021   CALCIUM 9.2 02/03/2021   ANIONGAP 13 12/30/2015   EGFR 36 (L) 02/03/2021   Lab Results  Component Value Date   CHOL 197 03/11/2020   Lab Results  Component Value Date   HDL 54 03/11/2020   Lab Results  Component Value Date   LDLCALC 116 (H) 03/11/2020   Lab Results  Component Value Date   TRIG 155 (H) 03/11/2020   Lab Results  Component Value Date   CHOLHDL 3.6 03/11/2020   Lab Results  Component Value Date   HGBA1C 5.1 08/25/2019      Assessment & Plan:   Problem List Items Addressed This Visit       Genitourinary   Chronic kidney disease (CKD) stage G3a/A1, moderately decreased glomerular filtration rate (GFR) between 45-59 mL/min/1.73 square meter and albuminuria creatinine ratio less than 30 mg/g (HCC) Persistent follows with nephrology     Other   Gout Persistent Mainly due to being out of medication secondary to loss   Other Visit Diagnoses     Hypertension, unspecified type    -  Primary Persistent Slight elevation due to being out of this medication Encouraged on going compliance with current medication regimen Encouraged home monitoring and recording BP  <130/80 Eating a heart-healthy diet with less salt Encouraged regular physical activity     Relevant Medications   amLODipine (NORVASC) 10 MG tablet   hydrochlorothiazide (HYDRODIURIL) 25 MG tablet   lisinopril (ZESTRIL) 20 MG tablet   pravastatin (PRAVACHOL) 40 MG tablet   Hyperlipidemia, unspecified hyperlipidemia type       Relevant Medications   amLODipine (NORVASC) 10 MG tablet   hydrochlorothiazide (HYDRODIURIL) 25 MG tablet   lisinopril (ZESTRIL) 20 MG tablet   pravastatin (PRAVACHOL) 40 MG tablet   Benign prostatic hyperplasia with urinary frequency       Relevant Medications   tamsulosin (FLOMAX) 0.4 MG CAPS capsule   Other Relevant Orders   Ambulatory referral to Urology   Other iron deficiency anemia       Relevant Orders   CBC with Differential/Platelet   Nocturia associated with benign prostatic hyperplasia    Worsening Despite current therapy patient rotates between finasteride 5 mg and tamsulosin 0.4 mg   Relevant Orders   Ambulatory referral to Urology   Essential hypertension       Relevant Medications   amLODipine (NORVASC) 10 MG tablet  hydrochlorothiazide (HYDRODIURIL) 25 MG tablet   lisinopril (ZESTRIL) 20 MG tablet   pravastatin (PRAVACHOL) 40 MG tablet   Insomnia due to medical condition     Stable   Relevant Medications   traZODone (DESYREL) 50 MG tablet       Meds ordered this encounter  Medications   amLODipine (NORVASC) 10 MG tablet    Sig: TAKE 1 TABLET (10 MG TOTAL) BY MOUTH DAILY.    Dispense:  90 tablet    Refill:  3    Order Specific Question:   Supervising Provider    Answer:   Tresa Garter [[id number]]   colchicine 0.6 MG tablet    Sig: TAKE 2 TBLETS (1.2MG) BY MOUTH AT FIRST SIGN OF FLARE,FOLLOWED BY 1 TABLET (0.6MG) AFTER 1 HOUR    Dispense:  30 tablet    Refill:  1    Order Specific Question:   Supervising Provider    Answer:   Tresa Garter [[id number]]   finasteride (PROSCAR) 5 MG tablet    Sig: Take 1 tablet (5  mg total) by mouth daily.    Dispense:  30 tablet    Refill:  11    Order Specific Question:   Supervising Provider    Answer:   Tresa Garter [[id number]]   hydrochlorothiazide (HYDRODIURIL) 25 MG tablet    Sig: TAKE 1 TABLET (25 MG TOTAL) BY MOUTH DAILY.    Dispense:  90 tablet    Refill:  3    Order Specific Question:   Supervising Provider    Answer:   Tresa Garter [[id number]]   lisinopril (ZESTRIL) 20 MG tablet    Sig: TAKE 1 TABLET (20 MG TOTAL) BY MOUTH DAILY.    Dispense:  90 tablet    Refill:  3    Order Specific Question:   Supervising Provider    Answer:   Tresa Garter [[id number]]   pravastatin (PRAVACHOL) 40 MG tablet    Sig: TAKE 1 TABLET (40 MG TOTAL) BY MOUTH DAILY.    Dispense:  90 tablet    Refill:  3    Order Specific Question:   Supervising Provider    Answer:   Tresa Garter [id number]   tamsulosin (FLOMAX) 0.4 MG CAPS capsule    Sig: TAKE 1 CAPSULE (0.4 MG TOTAL) BY MOUTH DAILY AFTER BREAKFAST.    Dispense:  90 capsule    Refill:  3    Order Specific Question:   Supervising Provider    Answer:   Tresa Garter [[id number]]   traZODone (DESYREL) 50 MG tablet    Sig: TAKE 1 TABLET (50 MG TOTAL) BY MOUTH AT BEDTIME AS NEEDED FOR SLEEP.    Dispense:  90 tablet    Refill:  3    Order Specific Question:   Supervising Provider    Answer:   Tresa Garter [id number]    Follow-up: Return for Follow up HTN [id number].    Vevelyn Francois, NP

## 2021-07-28 NOTE — Patient Instructions (Addendum)
Managing Your Hypertension Hypertension, also called high blood pressure, is when the force of the blood pressing against the walls of the arteries is too strong. Arteries are blood vessels that carry blood from your heart throughout your body. Hypertension forces the heart to work harder to pump blood and may cause the arteries to become narrow or stiff. Understanding blood pressure readings Your personal target blood pressure may vary depending on your medical conditions, your age, and other factors. A blood pressure reading includes a higher number over a lower number. Ideally, your blood pressure should be below 120/80. You should know that: The first, or top, number is called the systolic pressure. It is a measure of the pressure in your arteries as your heart beats. The second, or bottom number, is called the diastolic pressure. It is a measure of the pressure in your arteries as the heart relaxes. Blood pressure is classified into four stages. Based on your blood pressure reading, your health care provider may use the following stages to determine what type of treatment you need, if any. Systolic pressure and diastolic pressure are measured in a unit called mmHg. Normal Systolic pressure: below 120. Diastolic pressure: below 80. Elevated Systolic pressure: 120-129. Diastolic pressure: below 80. Hypertension stage 1 Systolic pressure: 130-139. Diastolic pressure: 80-89. Hypertension stage 2 Systolic pressure: 140 or above. Diastolic pressure: 90 or above. How can this condition affect me? Managing your hypertension is an important responsibility. Over time, hypertension can damage the arteries and decrease blood flow to important parts of the body, including the brain, heart, and kidneys. Having untreated or uncontrolled hypertension can lead to: A heart attack. A stroke. A weakened blood vessel (aneurysm). Heart failure. Kidney damage. Eye damage. Metabolic syndrome. Memory and  concentration problems. Vascular dementia. What actions can I take to manage this condition? Hypertension can be managed by making lifestyle changes and possibly by taking medicines. Your health care provider will help you make a plan to bring your blood pressure within a normal range. Nutrition  Eat a diet that is high in fiber and potassium, and low in salt (sodium), added sugar, and fat. An example eating plan is called the Dietary Approaches to Stop Hypertension (DASH) diet. To eat this way: Eat plenty of fresh fruits and vegetables. Try to fill one-half of your plate at each meal with fruits and vegetables. Eat whole grains, such as whole-wheat pasta, brown rice, or whole-grain bread. Fill about one-fourth of your plate with whole grains. Eat low-fat dairy products. Avoid fatty cuts of meat, processed or cured meats, and poultry with skin. Fill about one-fourth of your plate with lean proteins such as fish, chicken without skin, beans, eggs, and tofu. Avoid pre-made and processed foods. These tend to be higher in sodium, added sugar, and fat. Reduce your daily sodium intake. Most people with hypertension should eat less than 1,500 mg of sodium a day. Lifestyle  Work with your health care provider to maintain a healthy body weight or to lose weight. Ask what an ideal weight is for you. Get at least 30 minutes of exercise that causes your heart to beat faster (aerobic exercise) most days of the week. Activities may include walking, swimming, or biking. Include exercise to strengthen your muscles (resistance exercise), such as weight lifting, as part of your weekly exercise routine. Try to do these types of exercises for 30 minutes at least 3 days a week. Do not use any products that contain nicotine or tobacco, such as cigarettes, e-cigarettes,   and chewing tobacco. If you need help quitting, ask your health care provider. Control any long-term (chronic) conditions you have, such as high  cholesterol or diabetes. Identify your sources of stress and find ways to manage stress. This may include meditation, deep breathing, or making time for fun activities. Alcohol use Do not drink alcohol if: Your health care provider tells you not to drink. You are pregnant, may be pregnant, or are planning to become pregnant. If you drink alcohol: Limit how much you use to: 0-1 drink a day for women. 0-2 drinks a day for men. Be aware of how much alcohol is in your drink. In the [place] one drink equals one 12 oz bottle of beer (355 mL), one 5 oz glass of wine (148 mL), or one 1 oz glass of hard liquor (44 mL). Medicines Your health care provider may prescribe medicine if lifestyle changes are not enough to get your blood pressure under control and if: Your systolic blood pressure is 130 or higher. Your diastolic blood pressure is 80 or higher. Take medicines only as told by your health care provider. Follow the directions carefully. Blood pressure medicines must be taken as told by your health care provider. The medicine does not work as well when you skip doses. Skipping doses also puts you at risk for problems. Monitoring Before you monitor your blood pressure: Do not smoke, drink caffeinated beverages, or exercise within 30 minutes before taking a measurement. Use the bathroom and empty your bladder (urinate). Sit quietly for at least 5 minutes before taking measurements. Monitor your blood pressure at home as told by your health care provider. To do this: Sit with your back straight and supported. Place your feet flat on the floor. Do not cross your legs. Support your arm on a flat surface, such as a table. Make sure your upper arm is at heart level. Each time you measure, take two or three readings one minute apart and record the results. You may also need to have your blood pressure checked regularly by your health care provider. General information Talk with your health care  provider about your diet, exercise habits, and other lifestyle factors that may be contributing to hypertension. Review all the medicines you take with your health care provider because there may be side effects or interactions. Keep all visits as told by your health care provider. Your health care provider can help you create and adjust your plan for managing your high blood pressure. Where to find more information National Heart, Lung, and Blood Institute: www.nhlbi.nih.gov American Heart Association: www.heart.org Contact a health care provider if: You think you are having a reaction to medicines you have taken. You have repeated (recurrent) headaches. You feel dizzy. You have swelling in your ankles. You have trouble with your vision. Get help right away if: You develop a severe headache or confusion. You have unusual weakness or numbness, or you feel faint. You have severe pain in your chest or abdomen. You vomit repeatedly. You have trouble breathing. These symptoms may represent a serious problem that is an emergency. Do not wait to see if the symptoms will go away. Get medical help right away. Call your local emergency services (911 in the [place] Do not drive yourself to the hospital. Summary Hypertension is when the force of blood pumping through your arteries is too strong. If this condition is not controlled, it may put you at risk for serious complications. Your personal target blood pressure may vary depending on   your medical conditions, your age, and other factors. For most people, a normal blood pressure is less than 120/80. Hypertension is managed by lifestyle changes, medicines, or both. Lifestyle changes to help manage hypertension include losing weight, eating a healthy, low-sodium diet, exercising more, stopping smoking, and limiting alcohol. This information is not intended to replace advice given to you by your health care provider. Make sure you discuss any questions  you have with your health care provider. Document Revised: 11/10/2019 Document Reviewed: 09/05/2019 Elsevier Patient Education  2022 [place].  Benign Prostatic Hyperplasia Benign prostatic hyperplasia (BPH) is an enlarged prostate gland that is caused by the normal aging process and not by cancer. The prostate is a walnut-sized gland that is involved in the production of semen. It is located in front of the rectum and below the bladder. The bladder stores urine and the urethra is the tube that carries the urine out of the body. The prostate may get bigger as a man gets older. An enlarged prostate can press on the urethra. This can make it harder to pass urine. The build-up of urine in the bladder can cause infection. Back pressure and infection may progress to bladder damage and kidney (renal) failure. What are the causes? This condition is part of a normal aging process. However, not all men develop problems from this condition. If the prostate enlarges away from the urethra, urine flow will not be blocked. If it enlarges toward the urethra and compresses it, there will be problems passing urine. What increases the risk? This condition is more likely to develop in men over the age of 66 years. What are the signs or symptoms? Symptoms of this condition include: Getting up often during the night to urinate. Needing to urinate frequently during the day. Difficulty starting urine flow. Decrease in size and strength of your urine stream. Leaking (dribbling) after urinating. Inability to pass urine. This needs immediate treatment. Inability to completely empty your bladder. Pain when you pass urine. This is more common if there is also an infection. Urinary tract infection (UTI). How is this diagnosed? This condition is diagnosed based on your medical history, a physical exam, and your symptoms. Tests will also be done, such as: A post-void bladder scan. This measures any amount of urine  that may remain in your bladder after you finish urinating. A digital rectal exam. In a rectal exam, your health care provider checks your prostate by putting a lubricated, gloved finger into your rectum to feel the back of your prostate gland. This exam detects the size of your gland and any abnormal lumps or growths. An exam of your urine (urinalysis). A prostate specific antigen (PSA) screening. This is a blood test used to screen for prostate cancer. An ultrasound. This test uses sound waves to electronically produce a picture of your prostate gland. Your health care provider may refer you to a specialist in kidney and prostate diseases (urologist). How is this treated? Once symptoms begin, your health care provider will monitor your condition (active surveillance or watchful waiting). Treatment for this condition will depend on the severity of your condition. Treatment may include: Observation and yearly exams. This may be the only treatment needed if your condition and symptoms are mild. Medicines to relieve your symptoms, including: Medicines to shrink the prostate. Medicines to relax the muscle of the prostate. Surgery in severe cases. Surgery may include: Prostatectomy. In this procedure, the prostate tissue is removed completely through an open incision or with a  laparoscope or robotics. Transurethral resection of the prostate (TURP). In this procedure, a tool is inserted through the opening at the tip of the penis (urethra). It is used to cut away tissue of the inner core of the prostate. The pieces are removed through the same opening of the penis. This removes the blockage. Transurethral incision (TUIP). In this procedure, small cuts are made in the prostate. This lessens the prostate's pressure on the urethra. Transurethral microwave thermotherapy (TUMT). This procedure uses microwaves to create heat. The heat destroys and removes a small amount of prostate tissue. Transurethral needle  ablation (TUNA). This procedure uses radio frequencies to destroy and remove a small amount of prostate tissue. Interstitial laser coagulation ([place]). This procedure uses a laser to destroy and remove a small amount of prostate tissue. Transurethral electrovaporization (TUVP). This procedure uses electrodes to destroy and remove a small amount of prostate tissue. Prostatic urethral lift. This procedure inserts an implant to push the lobes of the prostate away from the urethra. Follow these instructions at home: Take over-the-counter and prescription medicines only as told by your health care provider. Monitor your symptoms for any changes. Contact your health care provider with any changes. Avoid drinking large amounts of liquid before going to bed or out in public. Avoid or reduce how much caffeine or alcohol you drink. Give yourself time when you urinate. Keep all follow-up visits as told by your health care provider. This is important. Contact a health care provider if: You have unexplained back pain. Your symptoms do not get better with treatment. You develop side effects from the medicine you are taking. Your urine becomes very dark or has a bad smell. Your lower abdomen becomes distended and you have trouble passing your urine. Get help right away if: You have a fever or chills. You suddenly cannot urinate. You feel lightheaded, or very dizzy, or you faint. There are large amounts of blood or clots in the urine. Your urinary problems become hard to manage. You develop moderate to severe low back or flank pain. The flank is the side of your body between the ribs and the hip. These symptoms may represent a serious problem that is an emergency. Do not wait to see if the symptoms will go away. Get medical help right away. Call your local emergency services (911 in the [place] Do not drive yourself to the hospital. Summary Benign prostatic hyperplasia (BPH) is an enlarged prostate that is  caused by the normal aging process and not by cancer. An enlarged prostate can press on the urethra. This can make it hard to pass urine. This condition is part of a normal aging process and is more likely to develop in men over the age of 61 years. Get help right away if you suddenly cannot urinate. This information is not intended to replace advice given to you by your health care provider. Make sure you discuss any questions you have with your health care provider. Document Revised: 01/15/2021 Document Reviewed: 06/13/2020 Elsevier Patient Education  [place].

## 2021-07-29 ENCOUNTER — Telehealth: Payer: Self-pay

## 2021-07-29 ENCOUNTER — Other Ambulatory Visit: Payer: Self-pay

## 2021-07-29 LAB — CBC WITH DIFFERENTIAL/PLATELET
Basophils Absolute: 0 10*3/uL (ref 0.0–0.2)
Basos: 1 %
EOS (ABSOLUTE): 0.2 10*3/uL (ref 0.0–0.4)
Eos: 3 %
Hematocrit: 35.3 % — ABNORMAL LOW (ref 37.5–51.0)
Hemoglobin: 11.6 g/dL — ABNORMAL LOW (ref 13.0–17.7)
Immature Grans (Abs): 0 10*3/uL (ref 0.0–0.1)
Immature Granulocytes: 0 %
Lymphocytes Absolute: 2.1 10*3/uL (ref 0.7–3.1)
Lymphs: 32 %
MCH: 29.4 pg (ref 26.6–33.0)
MCHC: 32.9 g/dL (ref 31.5–35.7)
MCV: 89 fL (ref 79–97)
Monocytes Absolute: 0.7 10*3/uL (ref 0.1–0.9)
Monocytes: 11 %
Neutrophils Absolute: 3.4 10*3/uL (ref 1.4–7.0)
Neutrophils: 53 %
Platelets: 293 10*3/uL (ref 150–450)
RBC: 3.95 x10E6/uL — ABNORMAL LOW (ref 4.14–5.80)
RDW: 13.6 % (ref 11.6–15.4)
WBC: 6.5 10*3/uL (ref 3.4–10.8)

## 2021-07-29 NOTE — Telephone Encounter (Signed)
meloxicam

## 2021-07-30 ENCOUNTER — Other Ambulatory Visit: Payer: Self-pay

## 2021-07-30 ENCOUNTER — Other Ambulatory Visit: Payer: Self-pay | Admitting: Nurse Practitioner

## 2021-07-30 MED ORDER — MELOXICAM 7.5 MG PO TABS
7.5000 mg | ORAL_TABLET | Freq: Every day | ORAL | 1 refills | Status: DC | PRN
Start: 2021-07-30 — End: 2021-10-27
  Filled 2021-07-30: qty 30, 30d supply, fill #0

## 2021-07-30 NOTE — Telephone Encounter (Signed)
The refill has been sent. Thanks

## 2021-07-30 NOTE — Progress Notes (Signed)
Anemia continues I recommend we concern having his colonoscopy repeated. It has been 5 yrs, he has some polyps . Please let me know thanks

## 2021-08-04 ENCOUNTER — Encounter: Payer: Self-pay | Admitting: Physical Medicine and Rehabilitation

## 2021-08-04 ENCOUNTER — Ambulatory Visit (INDEPENDENT_AMBULATORY_CARE_PROVIDER_SITE_OTHER): Payer: Medicare PPO | Admitting: Physical Medicine and Rehabilitation

## 2021-08-04 ENCOUNTER — Other Ambulatory Visit: Payer: Self-pay

## 2021-08-04 DIAGNOSIS — R202 Paresthesia of skin: Secondary | ICD-10-CM

## 2021-08-04 NOTE — Telephone Encounter (Signed)
Pt is aware.  

## 2021-08-04 NOTE — Progress Notes (Signed)
Pt state he has pain, numbness and tingling in both hands, mostly his right hand. Pt state his right handed. Pt state he lifted and put things together that cause aches in his hand. Pt state he feels numbness and tingling in his thumb, pointer and middle finger in both hands. Pt state he uses bio freeze to help ease his pain.  Numeric Pain Rating Scale and Functional Assessment Average Pain 8   In the last MONTH (on 0-10 scale) has pain interfered with the following?  1. General activity like being  able to carry out your everyday physical activities such as walking, climbing stairs, carrying groceries, or moving a chair?  Rating(10)  -BT, -Dye Allergies.

## 2021-08-04 NOTE — Progress Notes (Signed)
Daniel Haynes - 70 y.o. male MRN [id number]  Date of birth: Feb 26, 1951  Office Visit Note: Visit Date: 08/04/2021 PCP: Vevelyn Francois, NP Referred by: Vevelyn Francois, NP  Subjective: Chief Complaint  Patient presents with   Right Hand - Pain, Numbness, Tingling   Left Hand - Pain, Numbness, Tingling   HPI:  Daniel Haynes is a 70 y.o. male who comes in today at the request of Benita Stabile, PA-C for electrodiagnostic study of the Bilateral upper extremities.  Patient is Right hand dominant. Regards to numbness tingling both hands still having numbness tingling both hands.  His hands do tingle and particularly at the thumbs bilaterally.  He has recently been taken off his Neurontin due to the fact that he did not feel it was helping much. HbA1C 1 year ago 5.1.   ROS Otherwise per HPI.  Assessment & Plan: Visit Diagnoses:    ICD-10-CM   1. Paresthesia of skin  R20.2 NCV with EMG (electromyography)      Plan: Impression: The above electrodiagnostic study is ABNORMAL and reveals evidence of a mild to moderate right median nerve entrapment at the wrist (carpal tunnel syndrome) affecting sensory and motor components.   There is no significant electrodiagnostic evidence of any Left median nerve neuropathy or other focal nerve entrapment, brachial plexopathy or cervical radiculopathy.   Recommendations: 1.  Follow-up with referring physician. 2.  Continue current management of symptoms. 3.  Suggest surgical evaluation.  Meds & Orders: No orders of the defined types were placed in this encounter.   Orders Placed This Encounter  Procedures   NCV with EMG (electromyography)     Follow-up: Return in about 2 weeks (around 08/18/2021) for Benita Stabile, P.A.-C.   Procedures: No procedures performed  EMG & NCV Findings: Evaluation of the left median (across palm) sensory nerve showed no response (Palm).  The right median (across palm) sensory nerve showed no response (Palm) and prolonged distal  peak latency (4.2 ms).  All remaining nerves (as indicated in the following tables) were within normal limits.  All left vs. right side differences were within normal limits.    All examined muscles (as indicated in the following table) showed no evidence of electrical instability.    Impression: The above electrodiagnostic study is ABNORMAL and reveals evidence of a mild to moderate right median nerve entrapment at the wrist (carpal tunnel syndrome) affecting sensory and motor components.   There is no significant electrodiagnostic evidence of any Left median nerve neuropathy or other focal nerve entrapment, brachial plexopathy or cervical radiculopathy.   Recommendations: 1.  Follow-up with referring physician. 2.  Continue current management of symptoms. 3.  Suggest surgical evaluation.  ___________________________ Laurence Spates FAAPMR Board Certified, American Board of Physical Medicine and Rehabilitation    Nerve Conduction Studies Anti Sensory Summary Table   Stim Site NR Peak (ms) Norm Peak (ms) P-T Amp (V) Norm P-T Amp Site1 Site2 Delta-P (ms) Dist (cm) Vel (m/s) Norm Vel (m/s)  Left Median Acr Palm Anti Sensory (2nd Digit)  31.4C  Wrist    3.3 <3.6 23.7 >10 Wrist Palm  0.0    Palm *NR  <2.0          Right Median Acr Palm Anti Sensory (2nd Digit)  30.6C  Wrist    *4.2 <3.6 15.8 >10 Wrist Palm  0.0    Palm *NR  <2.0          Left Radial Anti Sensory (Base 1st Digit)  31.8C  Wrist    2.3 <3.1 53.9  Wrist Base 1st Digit 2.3 0.0    Right Radial Anti Sensory (Base 1st Digit)  31.3C  Wrist    2.8 <3.1 25.5  Wrist Base 1st Digit 2.8 0.0    Left Ulnar Anti Sensory (5th Digit)  31.5C  Wrist    3.5 <3.7 18.6 >15.0 Wrist 5th Digit 3.5 14.0 40 >38  Right Ulnar Anti Sensory (5th Digit)  31.4C  Wrist    3.3 <3.7 26.0 >15.0 Wrist 5th Digit 3.3 14.0 42 >38   Motor Summary Table   Stim Site NR Onset (ms) Norm Onset (ms) O-P Amp (mV) Norm O-P Amp Site1 Site2 Delta-0 (ms) Dist (cm)  Vel (m/s) Norm Vel (m/s)  Left Median Motor (Abd Poll Brev)  31.8C  Wrist    4.1 <4.2 6.1 >5 Elbow Wrist 3.8 22.0 58 >50  Elbow    7.9  5.9         Right Median Motor (Abd Poll Brev)  31.6C  Wrist    4.2 <4.2 5.2 >5 Elbow Wrist 4.5 22.5 50 >50  Elbow    8.7  5.1         Left Ulnar Motor (Abd Dig Min)  31.9C  Wrist    3.1 <4.2 6.3 >3 B Elbow Wrist 4.2 23.0 55 >53  B Elbow    7.3  6.0  A Elbow B Elbow 1.9 10.0 53 >53  A Elbow    9.2  5.8         Right Ulnar Motor (Abd Dig Min)  31.7C  Wrist    3.0 <4.2 6.9 >3 B Elbow Wrist 3.9 22.0 56 >53  B Elbow    6.9  6.1  A Elbow B Elbow 1.8 10.0 56 >53  A Elbow    8.7  5.6          EMG   Side Muscle Nerve Root Ins Act Fibs Psw Amp Dur Poly Recrt Int Fraser Din Comment  Right Abd Poll Brev Median C8-T1 Nml Nml Nml Nml Nml 0 Nml Nml   Right 1stDorInt Ulnar C8-T1 Nml Nml Nml Nml Nml 0 Nml Nml   Right PronatorTeres Median C6-7 Nml Nml Nml Nml Nml 0 Nml Nml   Right Biceps Musculocut C5-6 Nml Nml Nml Nml Nml 0 Nml Nml   Right Deltoid Axillary C5-6 Nml Nml Nml Nml Nml 0 Nml Nml     Nerve Conduction Studies Anti Sensory Left/Right Comparison   Stim Site L Lat (ms) R Lat (ms) L-R Lat (ms) L Amp (V) R Amp (V) L-R Amp (%) Site1 Site2 L Vel (m/s) R Vel (m/s) L-R Vel (m/s)  Median Acr Palm Anti Sensory (2nd Digit)  31.4C  Wrist 3.3 *4.2 0.9 23.7 15.8 33.3 Wrist Palm     Palm             Radial Anti Sensory (Base 1st Digit)  31.8C  Wrist 2.3 2.8 0.5 53.9 25.5 52.7 Wrist Base 1st Digit     Ulnar Anti Sensory (5th Digit)  31.5C  Wrist 3.5 3.3 0.2 18.6 26.0 28.5 Wrist 5th Digit 40 42 2   Motor Left/Right Comparison   Stim Site L Lat (ms) R Lat (ms) L-R Lat (ms) L Amp (mV) R Amp (mV) L-R Amp (%) Site1 Site2 L Vel (m/s) R Vel (m/s) L-R Vel (m/s)  Median Motor (Abd Poll Brev)  31.8C  Wrist 4.1 4.2 0.1 6.1 5.2 14.8 Elbow Wrist 58 50 8  Elbow  7.9 8.7 0.8 5.9 5.1 13.6       Ulnar Motor (Abd Dig Min)  31.9C  Wrist 3.1 3.0 0.1 6.3 6.9 8.7 B Elbow Wrist 55  56 1  B Elbow 7.3 6.9 0.4 6.0 6.1 1.6 A Elbow B Elbow 53 56 3  A Elbow 9.2 8.7 0.5 5.8 5.6 3.4          Waveforms:                     Clinical History: CERVICAL SPINE - COMPLETE 4+ VIEW   COMPARISON:  None.   FINDINGS: Prior cervical ACDF from C3 to C6 with evidence of solid arthrodesis. Hardware appears intact. Prevertebral soft tissue contour within normal limits.   Adjacent segment disease at C6-C7 with severe disc space loss and some endplate spurring. Less pronounced disc space loss and endplate spurring at the C2-C3 adjacent segment. Bilateral posterior element alignment is within normal limits. Normal cervical AP alignment. Normal C1-C2 alignment. Cervicothoracic junction alignment is within normal limits. C7-T1 disc space loss and endplate spurring also. No acute osseous abnormality identified. Incidental dystrophic nuchal calcification. Negative visible upper chest.   IMPRESSION: 1. C3 to C6 ACDF with solid arthrodesis. 2. Chronic disc and endplate degeneration elsewhere, including the adjacents segment C2-C3 and C6-C7. 3.  No acute osseous abnormality identified.     Electronically Signed   By: Genevie Ann M.D.   On: 05/05/2021 10:53     Objective:  VS:  HT:    WT:   BMI:     BP:   HR: bpm  TEMP: ( )  RESP:  Physical Exam Musculoskeletal:        General: No tenderness.     Comments: Inspection reveals no atrophy of the bilateral APB or FDI or hand intrinsics. There is no swelling, color changes, allodynia or dystrophic changes. There is 5 out of 5 strength in the bilateral wrist extension, finger abduction and long finger flexion. There is intact sensation to light touch in all dermatomal and peripheral nerve distributions. There is a negative Hoffmann's test bilaterally.  Skin:    General: Skin is warm and dry.     Findings: No erythema or rash.  Neurological:     General: No focal deficit present.     Mental Status: He is alert and oriented to  person, place, and time.     Sensory: No sensory deficit.     Motor: No weakness or abnormal muscle tone.     Coordination: Coordination normal.     Gait: Gait normal.  Psychiatric:        Mood and Affect: Mood normal.        Behavior: Behavior normal.        Thought Content: Thought content normal.     Imaging: No results found.

## 2021-08-06 ENCOUNTER — Other Ambulatory Visit: Payer: Self-pay

## 2021-08-06 NOTE — Procedures (Signed)
EMG & NCV Findings: Evaluation of the left median (across palm) sensory nerve showed no response (Palm).  The right median (across palm) sensory nerve showed no response (Palm) and prolonged distal peak latency (4.2 ms).  All remaining nerves (as indicated in the following tables) were within normal limits.  All left vs. right side differences were within normal limits.    All examined muscles (as indicated in the following table) showed no evidence of electrical instability.    Impression: The above electrodiagnostic study is ABNORMAL and reveals evidence of a mild to moderate right median nerve entrapment at the wrist (carpal tunnel syndrome) affecting sensory and motor components.   There is no significant electrodiagnostic evidence of any Left median nerve neuropathy or other focal nerve entrapment, brachial plexopathy or cervical radiculopathy.   Recommendations: 1.  Follow-up with referring physician. 2.  Continue current management of symptoms. 3.  Suggest surgical evaluation.  ___________________________ Laurence Spates FAAPMR Board Certified, American Board of Physical Medicine and Rehabilitation    Nerve Conduction Studies Anti Sensory Summary Table   Stim Site NR Peak (ms) Norm Peak (ms) P-T Amp (V) Norm P-T Amp Site1 Site2 Delta-P (ms) Dist (cm) Vel (m/s) Norm Vel (m/s)  Left Median Acr Palm Anti Sensory (2nd Digit)  31.4C  Wrist    3.3 <3.6 23.7 >10 Wrist Palm  0.0    Palm *NR  <2.0          Right Median Acr Palm Anti Sensory (2nd Digit)  30.6C  Wrist    *4.2 <3.6 15.8 >10 Wrist Palm  0.0    Palm *NR  <2.0          Left Radial Anti Sensory (Base 1st Digit)  31.8C  Wrist    2.3 <3.1 53.9  Wrist Base 1st Digit 2.3 0.0    Right Radial Anti Sensory (Base 1st Digit)  31.3C  Wrist    2.8 <3.1 25.5  Wrist Base 1st Digit 2.8 0.0    Left Ulnar Anti Sensory (5th Digit)  31.5C  Wrist    3.5 <3.7 18.6 >15.0 Wrist 5th Digit 3.5 14.0 40 >38  Right Ulnar Anti Sensory (5th Digit)   31.4C  Wrist    3.3 <3.7 26.0 >15.0 Wrist 5th Digit 3.3 14.0 42 >38   Motor Summary Table   Stim Site NR Onset (ms) Norm Onset (ms) O-P Amp (mV) Norm O-P Amp Site1 Site2 Delta-0 (ms) Dist (cm) Vel (m/s) Norm Vel (m/s)  Left Median Motor (Abd Poll Brev)  31.8C  Wrist    4.1 <4.2 6.1 >5 Elbow Wrist 3.8 22.0 58 >50  Elbow    7.9  5.9         Right Median Motor (Abd Poll Brev)  31.6C  Wrist    4.2 <4.2 5.2 >5 Elbow Wrist 4.5 22.5 50 >50  Elbow    8.7  5.1         Left Ulnar Motor (Abd Dig Min)  31.9C  Wrist    3.1 <4.2 6.3 >3 B Elbow Wrist 4.2 23.0 55 >53  B Elbow    7.3  6.0  A Elbow B Elbow 1.9 10.0 53 >53  A Elbow    9.2  5.8         Right Ulnar Motor (Abd Dig Min)  31.7C  Wrist    3.0 <4.2 6.9 >3 B Elbow Wrist 3.9 22.0 56 >53  B Elbow    6.9  6.1  A Elbow B Elbow 1.8  10.0 56 >53  A Elbow    8.7  5.6          EMG   Side Muscle Nerve Root Ins Act Fibs Psw Amp Dur Poly Recrt Int Fraser Din Comment  Right Abd Poll Brev Median C8-T1 Nml Nml Nml Nml Nml 0 Nml Nml   Right 1stDorInt Ulnar C8-T1 Nml Nml Nml Nml Nml 0 Nml Nml   Right PronatorTeres Median C6-7 Nml Nml Nml Nml Nml 0 Nml Nml   Right Biceps Musculocut C5-6 Nml Nml Nml Nml Nml 0 Nml Nml   Right Deltoid Axillary C5-6 Nml Nml Nml Nml Nml 0 Nml Nml     Nerve Conduction Studies Anti Sensory Left/Right Comparison   Stim Site L Lat (ms) R Lat (ms) L-R Lat (ms) L Amp (V) R Amp (V) L-R Amp (%) Site1 Site2 L Vel (m/s) R Vel (m/s) L-R Vel (m/s)  Median Acr Palm Anti Sensory (2nd Digit)  31.4C  Wrist 3.3 *4.2 0.9 23.7 15.8 33.3 Wrist Palm     Palm             Radial Anti Sensory (Base 1st Digit)  31.8C  Wrist 2.3 2.8 0.5 53.9 25.5 52.7 Wrist Base 1st Digit     Ulnar Anti Sensory (5th Digit)  31.5C  Wrist 3.5 3.3 0.2 18.6 26.0 28.5 Wrist 5th Digit 40 42 2   Motor Left/Right Comparison   Stim Site L Lat (ms) R Lat (ms) L-R Lat (ms) L Amp (mV) R Amp (mV) L-R Amp (%) Site1 Site2 L Vel (m/s) R Vel (m/s) L-R Vel (m/s)  Median Motor  (Abd Poll Brev)  31.8C  Wrist 4.1 4.2 0.1 6.1 5.2 14.8 Elbow Wrist 58 50 8  Elbow 7.9 8.7 0.8 5.9 5.1 13.6       Ulnar Motor (Abd Dig Min)  31.9C  Wrist 3.1 3.0 0.1 6.3 6.9 8.7 B Elbow Wrist 55 56 1  B Elbow 7.3 6.9 0.4 6.0 6.1 1.6 A Elbow B Elbow 53 56 3  A Elbow 9.2 8.7 0.5 5.8 5.6 3.4          Waveforms:

## 2021-08-18 ENCOUNTER — Ambulatory Visit: Payer: Medicare PPO | Admitting: Orthopaedic Surgery

## 2021-08-25 ENCOUNTER — Ambulatory Visit: Payer: Medicare PPO | Admitting: Orthopaedic Surgery

## 2021-09-03 ENCOUNTER — Other Ambulatory Visit: Payer: Self-pay

## 2021-09-09 ENCOUNTER — Other Ambulatory Visit: Payer: Self-pay

## 2021-09-15 ENCOUNTER — Other Ambulatory Visit: Payer: Self-pay

## 2021-09-16 ENCOUNTER — Other Ambulatory Visit: Payer: Self-pay

## 2021-10-01 ENCOUNTER — Other Ambulatory Visit: Payer: Self-pay

## 2021-10-03 ENCOUNTER — Other Ambulatory Visit: Payer: Self-pay

## 2021-10-08 ENCOUNTER — Other Ambulatory Visit: Payer: Self-pay

## 2021-10-10 ENCOUNTER — Other Ambulatory Visit: Payer: Self-pay

## 2021-10-24 ENCOUNTER — Other Ambulatory Visit: Payer: Self-pay

## 2021-10-25 ENCOUNTER — Other Ambulatory Visit: Payer: Self-pay

## 2021-10-27 ENCOUNTER — Encounter: Payer: Self-pay | Admitting: Nurse Practitioner

## 2021-10-27 ENCOUNTER — Ambulatory Visit (INDEPENDENT_AMBULATORY_CARE_PROVIDER_SITE_OTHER): Payer: Medicare PPO | Admitting: Nurse Practitioner

## 2021-10-27 ENCOUNTER — Other Ambulatory Visit: Payer: Self-pay

## 2021-10-27 VITALS — BP 133/68 | HR 58 | Temp 98.0°F | Ht 66.0 in | Wt 156.6 lb

## 2021-10-27 DIAGNOSIS — M5431 Sciatica, right side: Secondary | ICD-10-CM

## 2021-10-27 DIAGNOSIS — M79641 Pain in right hand: Secondary | ICD-10-CM | POA: Diagnosis not present

## 2021-10-27 DIAGNOSIS — R202 Paresthesia of skin: Secondary | ICD-10-CM

## 2021-10-27 DIAGNOSIS — E785 Hyperlipidemia, unspecified: Secondary | ICD-10-CM

## 2021-10-27 DIAGNOSIS — G4701 Insomnia due to medical condition: Secondary | ICD-10-CM

## 2021-10-27 DIAGNOSIS — R350 Frequency of micturition: Secondary | ICD-10-CM

## 2021-10-27 DIAGNOSIS — M79642 Pain in left hand: Secondary | ICD-10-CM

## 2021-10-27 DIAGNOSIS — M109 Gout, unspecified: Secondary | ICD-10-CM | POA: Diagnosis not present

## 2021-10-27 DIAGNOSIS — I10 Essential (primary) hypertension: Secondary | ICD-10-CM

## 2021-10-27 DIAGNOSIS — N401 Enlarged prostate with lower urinary tract symptoms: Secondary | ICD-10-CM

## 2021-10-27 MED ORDER — HYDROCHLOROTHIAZIDE 25 MG PO TABS
25.0000 mg | ORAL_TABLET | Freq: Every day | ORAL | 1 refills | Status: DC
  Filled 2021-10-27: qty 90, 90d supply, fill #0
  Filled 2021-12-04: qty 30, 30d supply, fill #0
  Filled 2022-01-01: qty 30, 30d supply, fill #1
  Filled 2022-01-19: qty 30, 30d supply, fill #2
  Filled 2022-02-18: qty 30, 30d supply, fill #3
  Filled 2022-03-18: qty 30, 30d supply, fill #4
  Filled 2022-05-29: qty 30, 30d supply, fill #5

## 2021-10-27 MED ORDER — MELOXICAM 7.5 MG PO TABS
7.5000 mg | ORAL_TABLET | Freq: Every day | ORAL | 1 refills | Status: AC | PRN
  Filled 2021-10-27: qty 30, 30d supply, fill #0
  Filled 2021-12-04: qty 30, 30d supply, fill #1

## 2021-10-27 MED ORDER — FINASTERIDE 5 MG PO TABS: 5.0000 mg | ORAL_TABLET | Freq: Every day | ORAL | 1 refills | Status: AC

## 2021-10-27 MED ORDER — PRAVASTATIN SODIUM 40 MG PO TABS
40.0000 mg | ORAL_TABLET | Freq: Every day | ORAL | 1 refills | Status: DC
  Filled 2021-10-27 – 2022-08-19 (×2): qty 90, 90d supply, fill #0

## 2021-10-27 MED ORDER — PREGABALIN 50 MG PO CAPS: 50.0000 mg | ORAL_CAPSULE | Freq: Three times a day (TID) | ORAL | 0 refills | Status: DC

## 2021-10-27 MED ORDER — TAMSULOSIN HCL 0.4 MG PO CAPS
0.4000 mg | ORAL_CAPSULE | Freq: Every day | ORAL | 0 refills | Status: DC
  Filled 2021-10-27: qty 30, 30d supply, fill #0
  Filled 2021-12-26: qty 30, 30d supply, fill #1
  Filled 2022-02-18: qty 30, 30d supply, fill #2
  Filled 2022-04-10: qty 30, 30d supply, fill #3
  Filled 2022-05-29: qty 30, 30d supply, fill #4
  Filled 2022-07-22: qty 30, 30d supply, fill #5

## 2021-10-27 MED ORDER — TRAMADOL HCL 50 MG PO TABS: 50.0000 mg | ORAL_TABLET | Freq: Three times a day (TID) | ORAL | 0 refills | Status: DC | PRN

## 2021-10-27 MED ORDER — COLCHICINE 0.6 MG PO TABS
ORAL_TABLET | ORAL | 5 refills | Status: DC
  Filled 2021-10-27: qty 30, 10d supply, fill #0
  Filled 2021-12-04: qty 30, 10d supply, fill #1
  Filled 2022-01-19: qty 30, 10d supply, fill #2
  Filled 2022-02-18: qty 30, 10d supply, fill #3
  Filled 2022-03-18: qty 30, 10d supply, fill #4
  Filled 2022-04-10: qty 30, 10d supply, fill #5

## 2021-10-27 MED ORDER — LISINOPRIL 20 MG PO TABS
20.0000 mg | ORAL_TABLET | Freq: Every day | ORAL | 1 refills | Status: DC
  Filled 2021-10-27: qty 30, 30d supply, fill #0
  Filled 2021-12-26: qty 30, 30d supply, fill #1
  Filled 2022-02-18: qty 30, 30d supply, fill #2
  Filled 2022-04-10: qty 30, 30d supply, fill #3
  Filled 2022-05-29: qty 30, 30d supply, fill #4
  Filled 2022-07-22: qty 30, 30d supply, fill #5

## 2021-10-27 MED ORDER — AMLODIPINE BESYLATE 10 MG PO TABS
10.0000 mg | ORAL_TABLET | Freq: Every day | ORAL | 1 refills | Status: DC
  Filled 2021-10-27 – 2022-08-19 (×2): qty 90, 90d supply, fill #0

## 2021-10-27 MED ORDER — TRAZODONE HCL 50 MG PO TABS
50.0000 mg | ORAL_TABLET | Freq: Every evening | ORAL | 1 refills | Status: DC | PRN
  Filled 2021-10-27: qty 30, 30d supply, fill #0

## 2021-10-27 NOTE — Progress Notes (Signed)
[facility] [place], [place]  [address] Phone:  [phone]   Fax:  [phone]    Established Patient Office Visit  Subjective:  Patient ID: Daniel Haynes, male    DOB: 08/15/1951  Age: 71 y.o. MRN: [id number]  CC:  Chief Complaint  Patient presents with   Follow-up    Pt is here today for his 3 month follow up visit. Pt is needing medication refills.    HPI Daniel Haynes presents for follow up. He  has a past medical history of Arthritis, Gout, Hyperlipidemia, Hypertension, and Insomnia (08/2019).   He reports that his sister has breast cancer. He had a tought Christmas due to previous death of his son.Marland Kitchen He is feeling ok today.  He denies any suicidal or homicidal thoughts.  He had a nerve condution study. He contineus to have shoulder pain that is worse in the mornings.  The pain does improve some as the day progresses.  He does continue to work.  He has started with more self-care and is now taking Monday's off.  He would like to have something additional for his nerve pain.  He has failed gabapentin.  It may have been related to some of his swelling.  He has requested something that will help for his overall pain as well.  He is following up for his hypertension.  He is currently on amlodipine 10 mg, hydrochlorothiazide 25 mg and lisinopril 20 mg.  His blood pressure is within normal range.  Denies headache, dizziness, visual changes, shortness of breath, dyspnea on exertion, chest pain, nausea, vomiting or any edema in the legs.  He has maintained his weight over the past few months and was able to gain 3 pounds.  He reports doing well with his gout only had a minimal flare-up.  He continues on the colchicine 0.6 mg  Past Medical History:  Diagnosis Date   Arthritis    hands fingers wrists    Gout    Hyperlipidemia    Hypertension    Insomnia 08/2019    Past Surgical History:  Procedure Laterality Date   CERVICAL DISCECTOMY  10/19/2002    ACDF C3 to C6 [facility]    Family History  Problem Relation Age of Onset   Cancer Mother    Cancer Father    Colon cancer Neg Hx    Colon polyps Neg Hx    Esophageal cancer Neg Hx    Rectal cancer Neg Hx    Stomach cancer Neg Hx     Social History   Socioeconomic History   Marital status: Single    Spouse name: Not on file   Number of children: Not on file   Years of education: Not on file   Highest education level: Not on file  Occupational History   Not on file  Tobacco Use   Smoking status: Some Days    Packs/day: 0.25    Types: Cigarettes   Smokeless tobacco: Never   Tobacco comments:    6 cigs  per week.  Vaping Use   Vaping Use: Never used  Substance and Sexual Activity   Alcohol use: Yes    Alcohol/week: 21.0 standard drinks    Types: 21 Shots of liquor per week    Comment: 3 a day    Drug use: Yes    Types: Marijuana    Comment: occ use    Sexual activity: Not Currently  Other Topics Concern   Not on  file  Social History Narrative   Not on file   Social Determinants of Health   Financial Resource Strain: Not on file  Food Insecurity: Not on file  Transportation Needs: Not on file  Physical Activity: Not on file  Stress: Not on file  Social Connections: Not on file  Intimate Partner Violence: Not on file    Outpatient Medications Prior to Visit  Medication Sig Dispense Refill   amLODipine (NORVASC) 10 MG tablet TAKE 1 TABLET (10 MG TOTAL) BY MOUTH DAILY. 90 tablet 3   finasteride (PROSCAR) 5 MG tablet Take 1 tablet (5 mg total) by mouth daily. 30 tablet 11   methylPREDNISolone (MEDROL) 4 MG tablet take 6 tablets by mouth day 1, 5 tabs day 2, 4 tabs day 3, 3 tabs day 4, 2 tabs day 5 and 1 tab day 6  as directed  21 tablet 0   pravastatin (PRAVACHOL) 40 MG tablet TAKE 1 TABLET (40 MG TOTAL) BY MOUTH DAILY. 90 tablet 3   colchicine 0.6 MG tablet TAKE 2 TBLETS (1.2MG) BY MOUTH AT FIRST SIGN OF FLARE,FOLLOWED BY 1 TABLET (0.6MG) AFTER 1 HOUR 30 tablet 1    hydrochlorothiazide (HYDRODIURIL) 25 MG tablet TAKE 1 TABLET (25 MG TOTAL) BY MOUTH DAILY. 90 tablet 3   lisinopril (ZESTRIL) 20 MG tablet TAKE 1 TABLET (20 MG TOTAL) BY MOUTH DAILY. 90 tablet 3   meloxicam (MOBIC) 7.5 MG tablet Take 1 tablet (7.5 mg total) by mouth daily as needed for pain. 30 tablet 1   tamsulosin (FLOMAX) 0.4 MG CAPS capsule TAKE 1 CAPSULE (0.4 MG TOTAL) BY MOUTH DAILY AFTER BREAKFAST. 90 capsule 3   traZODone (DESYREL) 50 MG tablet TAKE 1 TABLET (50 MG TOTAL) BY MOUTH AT BEDTIME AS NEEDED FOR SLEEP. 90 tablet 3   No facility-administered medications prior to visit.    No Known Allergies  ROS Review of Systems    Objective:    Physical Exam Constitutional:      Appearance: He is normal weight.  HENT:     Head: Normocephalic and atraumatic.     Nose: Nose normal.     Mouth/Throat:     Mouth: Mucous membranes are moist.  Cardiovascular:     Rate and Rhythm: Normal rate and regular rhythm.     Pulses: Normal pulses.     Heart sounds: Normal heart sounds.  Pulmonary:     Effort: Pulmonary effort is normal.     Breath sounds: Normal breath sounds.  Abdominal:     Palpations: Abdomen is soft.  Musculoskeletal:        General: Swelling (Bilateral hands fingerless gloves) present.     Cervical back: Normal range of motion.     Right lower leg: No edema.     Left lower leg: No edema.  Skin:    General: Skin is warm and dry.     Capillary Refill: Capillary refill takes less than 2 seconds.  Neurological:     General: No focal deficit present.     Mental Status: He is alert and oriented to person, place, and time.  Psychiatric:        Mood and Affect: Mood normal.        Behavior: Behavior normal.        Thought Content: Thought content normal.        Judgment: Judgment normal.    BP 133/68    Pulse (!) 58    Temp 98 F (36.7 C)  Ht '5\' 6"'  (1.676 m)    Wt 156 lb 9.6 oz (71 kg)    SpO2 98%    BMI 25.28 kg/m  Wt Readings from Last 3 Encounters:   10/27/21 156 lb 9.6 oz (71 kg)  07/28/21 153 lb (69.4 kg)  05/05/21 152 lb 6.4 oz (69.1 kg)     Health Maintenance Due  Topic Date Due   COVID-19 Vaccine (3 - Booster) 12/01/2019    There are no preventive care reminders to display for this patient.  Lab Results  Component Value Date   TSH 0.744 08/25/2019   Lab Results  Component Value Date   WBC 6.5 07/28/2021   HGB 11.6 (L) 07/28/2021   HCT 35.3 (L) 07/28/2021   MCV 89 07/28/2021   PLT 293 07/28/2021   Lab Results  Component Value Date   NA 139 02/03/2021   K 4.2 02/03/2021   CO2 23 06/29/2017   GLUCOSE 125 (H) 02/03/2021   BUN 38 (H) 02/03/2021   CREATININE 1.96 (H) 02/03/2021   BILITOT <0.2 02/03/2021   ALKPHOS 139 (H) 02/03/2021   AST 12 02/03/2021   ALT 17 06/29/2017   PROT 6.7 02/03/2021   ALBUMIN 3.9 02/03/2021   CALCIUM 9.2 02/03/2021   ANIONGAP 13 12/30/2015   EGFR 36 (L) 02/03/2021   Lab Results  Component Value Date   CHOL 197 03/11/2020   Lab Results  Component Value Date   HDL 54 03/11/2020   Lab Results  Component Value Date   LDLCALC 116 (H) 03/11/2020   Lab Results  Component Value Date   TRIG 155 (H) 03/11/2020   Lab Results  Component Value Date   CHOLHDL 3.6 03/11/2020   Lab Results  Component Value Date   HGBA1C 5.1 08/25/2019      Assessment & Plan:   Problem List Items Addressed This Visit       Other   Gout Stable Reevaluation We will continue with colchicine 0.6 mg as directed   Relevant Orders   Uric Acid   Other Visit Diagnoses     Hypertension, unspecified type    -  Primary Stable Encouraged on going compliance with current medication regimen Encouraged home monitoring and recording BP <130/80 Eating a heart-healthy diet with less salt Encouraged regular physical activity     Relevant Medications   amLODipine (NORVASC) 10 MG tablet   hydrochlorothiazide (HYDRODIURIL) 25 MG tablet   lisinopril (ZESTRIL) 20 MG tablet   pravastatin (PRAVACHOL)  40 MG tablet   Sciatica of right side     Stable   Relevant Medications   traMADol (ULTRAM) 50 MG tablet   traZODone (DESYREL) 50 MG tablet   pregabalin (LYRICA) 50 MG capsule   Hyperlipidemia, unspecified hyperlipidemia type     Stable Continue with current regimen.  No changes warranted. Good patient compliance.    Relevant Medications   amLODipine (NORVASC) 10 MG tablet   hydrochlorothiazide (HYDRODIURIL) 25 MG tablet   lisinopril (ZESTRIL) 20 MG tablet   pravastatin (PRAVACHOL) 40 MG tablet   Essential hypertension       Relevant Medications   amLODipine (NORVASC) 10 MG tablet   hydrochlorothiazide (HYDRODIURIL) 25 MG tablet   lisinopril (ZESTRIL) 20 MG tablet   pravastatin (PRAVACHOL) 40 MG tablet   Insomnia due to medical condition     Stable   Relevant Medications   traZODone (DESYREL) 50 MG tablet   Benign prostatic hyperplasia with urinary frequency       Relevant Medications  tamsulosin (FLOMAX) 0.4 MG CAPS capsule       Meds ordered this encounter  Medications   traMADol (ULTRAM) 50 MG tablet    Sig: Take 1 tablet (50 mg total) by mouth every 8 (eight) hours as needed.    Dispense:  30 tablet    Refill:  0    Order Specific Question:   Supervising Provider    Answer:   Tresa Garter [[id number]]   DISCONTD: pregabalin (LYRICA) 50 MG capsule    Sig: Take 1 capsule (50 mg total) by mouth 3 (three) times daily.    Dispense:  90 capsule    Refill:  0    Do not place this medication, or any other prescription from our practice, on "Automatic Refill". Patient may have prescription filled one day early if pharmacy is closed on scheduled refill date.    Order Specific Question:   Supervising Provider    Answer:   Tresa Garter [[id number]]   amLODipine (NORVASC) 10 MG tablet    Sig: Take 1 tablet (10 mg total) by mouth daily.    Dispense:  90 tablet    Refill:  1    Order Specific Question:   Supervising Provider    Answer:   Tresa Garter  [[id number]]   colchicine 0.6 MG tablet    Sig: TAKE 2 TABLETS (1.2MG) BY MOUTH AT FIRST SIGN OF FLARE,FOLLOWED BY 1 TABLET (0.6MG) AFTER 1 HOUR    Dispense:  30 tablet    Refill:  5    Order Specific Question:   Supervising Provider    Answer:   Tresa Garter [[id number]]   finasteride (PROSCAR) 5 MG tablet    Sig: Take 1 tablet (5 mg total) by mouth daily.    Dispense:  90 tablet    Refill:  1    Order Specific Question:   Supervising Provider    Answer:   Tresa Garter [[id number]]   hydrochlorothiazide (HYDRODIURIL) 25 MG tablet    Sig: Take 1 tablet (25 mg total) by mouth daily.    Dispense:  90 tablet    Refill:  1    Order Specific Question:   Supervising Provider    Answer:   Tresa Garter [[id number]]   lisinopril (ZESTRIL) 20 MG tablet    Sig: Take 1 tablet (20 mg total) by mouth daily.    Dispense:  90 tablet    Refill:  1    Order Specific Question:   Supervising Provider    Answer:   Tresa Garter [[id number]]   meloxicam (MOBIC) 7.5 MG tablet    Sig: Take 1 tablet (7.5 mg total) by mouth daily as needed for pain.    Dispense:  30 tablet    Refill:  1    Order Specific Question:   Supervising Provider    Answer:   Tresa Garter [[id number]]   pravastatin (PRAVACHOL) 40 MG tablet    Sig: Take 1 tablet (40 mg total) by mouth daily.    Dispense:  90 tablet    Refill:  1    Order Specific Question:   Supervising Provider    Answer:   Tresa Garter [[id number]]   traZODone (DESYREL) 50 MG tablet    Sig: Take 1 tablet (50 mg total) by mouth at bedtime as needed for sleep.    Dispense:  90 tablet    Refill:  1    Order Specific Question:   Supervising Provider  AnswerTresa Garter [[id number]]   tamsulosin (FLOMAX) 0.4 MG CAPS capsule    Sig: Take 1 capsule (0.4 mg total) by mouth daily.    Dispense:  180 capsule    Refill:  0    Order Specific Question:   Supervising Provider    Answer:   Tresa Garter [[id number]]    pregabalin (LYRICA) 50 MG capsule    Sig: Take 1 capsule (50 mg total) by mouth 3 (three) times daily.    Dispense:  90 capsule    Refill:  0    Do not place this medication, or any other prescription from our practice, on "Automatic Refill". Patient may have prescription filled one day early if pharmacy is closed on scheduled refill date.    Order Specific Question:   Supervising Provider    Answer:   Tresa Garter [[id number]]    Follow-up: Return in about 4 weeks (around 11/24/2021) for Follow up HTN [id number].    Vevelyn Francois, NP

## 2021-10-28 ENCOUNTER — Other Ambulatory Visit: Payer: Self-pay

## 2021-10-28 LAB — URIC ACID: Uric Acid: 9 mg/dL — ABNORMAL HIGH (ref 3.8–8.4)

## 2021-11-14 ENCOUNTER — Telehealth: Payer: Self-pay

## 2021-11-14 NOTE — Telephone Encounter (Signed)
Called pt but vmb was full unable to leave a vm

## 2021-11-14 NOTE — Telephone Encounter (Signed)
-----   Message from Shirley Muscat, [place] sent at 11/13/2021 12:11 PM EST -----  ----- Message ----- From: Vevelyn Francois, NP Sent: 11/02/2021   9:09 PM EST To: Shirley Muscat, CMA  Uric acid remains high. Pt reports feeling well  No current changes. He has what he needs for treatment Thanks

## 2021-11-24 ENCOUNTER — Ambulatory Visit: Payer: Medicare PPO | Admitting: Nurse Practitioner

## 2021-12-04 ENCOUNTER — Other Ambulatory Visit: Payer: Self-pay

## 2021-12-05 ENCOUNTER — Other Ambulatory Visit: Payer: Self-pay

## 2021-12-26 ENCOUNTER — Other Ambulatory Visit: Payer: Self-pay

## 2021-12-30 ENCOUNTER — Other Ambulatory Visit: Payer: Self-pay

## 2022-01-01 ENCOUNTER — Other Ambulatory Visit: Payer: Self-pay

## 2022-01-19 ENCOUNTER — Other Ambulatory Visit: Payer: Self-pay

## 2022-02-13 ENCOUNTER — Other Ambulatory Visit: Payer: Self-pay

## 2022-02-13 ENCOUNTER — Other Ambulatory Visit: Payer: Self-pay | Admitting: Nurse Practitioner

## 2022-02-18 ENCOUNTER — Other Ambulatory Visit: Payer: Self-pay

## 2022-03-06 ENCOUNTER — Other Ambulatory Visit: Payer: Self-pay

## 2022-03-10 ENCOUNTER — Other Ambulatory Visit: Payer: Self-pay

## 2022-03-18 ENCOUNTER — Other Ambulatory Visit: Payer: Self-pay

## 2022-03-19 ENCOUNTER — Other Ambulatory Visit: Payer: Self-pay

## 2022-04-06 ENCOUNTER — Ambulatory Visit: Payer: Medicare PPO | Admitting: Nurse Practitioner

## 2022-04-10 ENCOUNTER — Other Ambulatory Visit: Payer: Self-pay

## 2022-05-06 ENCOUNTER — Other Ambulatory Visit: Payer: Self-pay

## 2022-05-08 ENCOUNTER — Ambulatory Visit: Payer: Medicare PPO | Admitting: Nurse Practitioner

## 2022-05-29 ENCOUNTER — Other Ambulatory Visit: Payer: Self-pay

## 2022-05-29 ENCOUNTER — Other Ambulatory Visit: Payer: Self-pay | Admitting: Nurse Practitioner

## 2022-05-29 DIAGNOSIS — M109 Gout, unspecified: Secondary | ICD-10-CM

## 2022-06-01 ENCOUNTER — Ambulatory Visit: Payer: Medicare PPO | Admitting: Nurse Practitioner

## 2022-06-03 ENCOUNTER — Other Ambulatory Visit: Payer: Self-pay

## 2022-06-03 MED ORDER — COLCHICINE 0.6 MG PO TABS
ORAL_TABLET | ORAL | 5 refills | Status: DC
  Filled 2022-06-03: qty 30, 10d supply, fill #0
  Filled 2022-07-10: qty 30, 10d supply, fill #1
  Filled 2022-08-19 – 2022-08-26 (×2): qty 30, 10d supply, fill #2
  Filled 2022-11-18 – 2022-11-19 (×2): qty 30, 10d supply, fill #3
  Filled 2023-05-05 (×2): qty 30, 10d supply, fill #4

## 2022-06-05 ENCOUNTER — Other Ambulatory Visit: Payer: Self-pay

## 2022-06-08 ENCOUNTER — Telehealth (INDEPENDENT_AMBULATORY_CARE_PROVIDER_SITE_OTHER): Payer: Medicare PPO | Admitting: Nurse Practitioner

## 2022-06-08 ENCOUNTER — Encounter: Payer: Self-pay | Admitting: Nurse Practitioner

## 2022-06-08 DIAGNOSIS — M79641 Pain in right hand: Secondary | ICD-10-CM

## 2022-06-08 DIAGNOSIS — R202 Paresthesia of skin: Secondary | ICD-10-CM

## 2022-06-08 DIAGNOSIS — M79642 Pain in left hand: Secondary | ICD-10-CM | POA: Diagnosis not present

## 2022-06-08 MED ORDER — PREGABALIN 50 MG PO CAPS: 50.0000 mg | ORAL_CAPSULE | Freq: Three times a day (TID) | ORAL | 0 refills | Status: DC

## 2022-06-08 NOTE — Patient Instructions (Signed)
1. Paresthesia  - pregabalin (LYRICA) 50 MG capsule; Take 1 capsule (50 mg total) by mouth 3 (three) times daily.  Dispense: 90 capsule; Refill: 0  2. Pain in both hands  - pregabalin (LYRICA) 50 MG capsule; Take 1 capsule (50 mg total) by mouth 3 (three) times daily.  Dispense: 90 capsule; Refill: 0  Follow up:  Follow up in 6 months or sooner if needed

## 2022-06-08 NOTE — Progress Notes (Signed)
Virtual Visit via Telephone Note  I connected with Daniel Haynes on 06/08/22 at  2:20 PM EDT by telephone and verified that I am speaking with the correct person using two identifiers.  Location: Patient: home Provider: office   I discussed the limitations, risks, security and privacy concerns of performing an evaluation and management service by telephone and the availability of in person appointments. I also discussed with the patient that there may be a patient responsible charge related to this service. The patient expressed understanding and agreed to proceed.   History of Present Illness:  Daniel Haynes presents for follow up. He  has a past medical history of Arthritis, Gout, Hyperlipidemia, Hypertension, and Insomnia (08/2019).   Patient presents today for a refill on lyrica. This is a telephone visit. Patient states that he has chronic pain and takes lyrica sparingly for this. He states that overall he has been stable. No issues with blood pressure. Denies f/c/s, n/v/d, hemoptysis, PND, leg swelling Denies chest pain or edema    Observations/Objective:       10/27/2021    8:37 AM 07/28/2021    9:35 AM 07/28/2021    9:28 AM  Vitals with BMI  Height '5\' 6"'$   '5\' 6"'$   Weight 156 lbs 10 oz  153 lbs  BMI 79.15  05.69  Systolic 794 801 655  Diastolic 68 87 93  Pulse 58 51 54      Assessment and Plan:  1. Paresthesia  - pregabalin (LYRICA) 50 MG capsule; Take 1 capsule (50 mg total) by mouth 3 (three) times daily.  Dispense: 90 capsule; Refill: 0  2. Pain in both hands  - pregabalin (LYRICA) 50 MG capsule; Take 1 capsule (50 mg total) by mouth 3 (three) times daily.  Dispense: 90 capsule; Refill: 0  Follow up:  Follow up in 6 months or sooner if needed     I discussed the assessment and treatment plan with the patient. The patient was provided an opportunity to ask questions and all were answered. The patient agreed with the plan and demonstrated an understanding of the  instructions.   The patient was advised to call back or seek an in-person evaluation if the symptoms worsen or if the condition fails to improve as anticipated.  I provided 24 minutes of non-face-to-face time during this encounter.   Fenton Ducksworth, NP

## 2022-06-26 ENCOUNTER — Other Ambulatory Visit: Payer: Self-pay

## 2022-06-29 ENCOUNTER — Other Ambulatory Visit: Payer: Self-pay

## 2022-07-01 ENCOUNTER — Other Ambulatory Visit: Payer: Self-pay

## 2022-07-10 ENCOUNTER — Other Ambulatory Visit: Payer: Self-pay

## 2022-07-16 ENCOUNTER — Other Ambulatory Visit: Payer: Self-pay

## 2022-07-22 ENCOUNTER — Other Ambulatory Visit: Payer: Self-pay

## 2022-07-22 ENCOUNTER — Other Ambulatory Visit: Payer: Self-pay | Admitting: Nurse Practitioner

## 2022-07-22 DIAGNOSIS — I10 Essential (primary) hypertension: Secondary | ICD-10-CM

## 2022-07-22 MED ORDER — HYDROCHLOROTHIAZIDE 25 MG PO TABS
25.0000 mg | ORAL_TABLET | Freq: Every day | ORAL | 1 refills | Status: DC
  Filled 2022-07-22 (×2): qty 90, 90d supply, fill #0

## 2022-07-23 ENCOUNTER — Other Ambulatory Visit: Payer: Self-pay

## 2022-08-19 ENCOUNTER — Other Ambulatory Visit: Payer: Self-pay | Admitting: Nurse Practitioner

## 2022-08-19 ENCOUNTER — Other Ambulatory Visit: Payer: Self-pay

## 2022-08-19 DIAGNOSIS — I10 Essential (primary) hypertension: Secondary | ICD-10-CM

## 2022-08-19 DIAGNOSIS — N401 Enlarged prostate with lower urinary tract symptoms: Secondary | ICD-10-CM

## 2022-08-19 MED ORDER — AMLODIPINE BESYLATE 10 MG PO TABS
10.0000 mg | ORAL_TABLET | Freq: Every day | ORAL | 1 refills | Status: DC
  Filled 2022-08-19: qty 90, 90d supply, fill #0
  Filled 2022-08-26: qty 30, 30d supply, fill #0
  Filled 2022-11-18 – 2022-11-19 (×2): qty 30, 30d supply, fill #1

## 2022-08-19 MED ORDER — LISINOPRIL 20 MG PO TABS
20.0000 mg | ORAL_TABLET | Freq: Every day | ORAL | 1 refills | Status: DC
  Filled 2022-08-19: qty 90, 90d supply, fill #0
  Filled 2022-08-26: qty 30, 30d supply, fill #0
  Filled 2022-11-18 – 2022-11-19 (×2): qty 30, 30d supply, fill #1
  Filled 2023-05-05 (×2): qty 30, 30d supply, fill #2
  Filled 2023-06-02: qty 30, 30d supply, fill #3
  Filled 2023-07-05: qty 30, 30d supply, fill #4
  Filled 2023-08-10: qty 30, 30d supply, fill #5

## 2022-08-19 MED ORDER — TAMSULOSIN HCL 0.4 MG PO CAPS
0.4000 mg | ORAL_CAPSULE | Freq: Every day | ORAL | 1 refills | Status: DC
  Filled 2022-08-19: qty 90, 90d supply, fill #0
  Filled 2022-08-26: qty 30, 30d supply, fill #0

## 2022-08-19 MED ORDER — PRAVASTATIN SODIUM 40 MG PO TABS
40.0000 mg | ORAL_TABLET | Freq: Every day | ORAL | 1 refills | Status: DC
  Filled 2022-08-19 – 2022-08-26 (×2): qty 90, 90d supply, fill #0
  Filled 2023-05-05 (×2): qty 90, 90d supply, fill #1

## 2022-08-26 ENCOUNTER — Other Ambulatory Visit: Payer: Self-pay

## 2022-08-27 ENCOUNTER — Other Ambulatory Visit: Payer: Self-pay

## 2022-11-18 ENCOUNTER — Other Ambulatory Visit: Payer: Self-pay | Admitting: Nurse Practitioner

## 2022-11-19 ENCOUNTER — Other Ambulatory Visit: Payer: Self-pay

## 2022-11-19 ENCOUNTER — Telehealth: Payer: Self-pay | Admitting: Nurse Practitioner

## 2022-11-19 NOTE — Telephone Encounter (Signed)
Left message for patient to call back and schedule Medicare Annual Wellness Visit (AWV) in office.   If not able to come in office, please offer to do virtually or by telephone.  Left office number and my jabber [phone].  AWVI eligible as of :02/16/2017  Please schedule at anytime with Nurse Health Advisor.

## 2022-11-20 ENCOUNTER — Other Ambulatory Visit: Payer: Self-pay

## 2022-11-20 MED ORDER — FINASTERIDE 5 MG PO TABS
5.0000 mg | ORAL_TABLET | Freq: Every day | ORAL | 0 refills | Status: DC
  Filled 2022-11-20: qty 30, 30d supply, fill #0

## 2022-11-23 ENCOUNTER — Other Ambulatory Visit: Payer: Self-pay

## 2022-11-26 ENCOUNTER — Other Ambulatory Visit: Payer: Self-pay

## 2022-12-09 ENCOUNTER — Ambulatory Visit: Payer: Medicare PPO | Admitting: Nurse Practitioner

## 2023-03-03 NOTE — Telephone Encounter (Signed)
Per note by Lyla Son patient called back - He stated that he is currently working full time at this time and will not be able to do AWV during office hours.     He will call back when he is able to schedule  Thank you,  Judeth Cornfield,  AMB Clinical Support [facility] AWV Program Direct Dial ??[phone]

## 2023-05-05 ENCOUNTER — Other Ambulatory Visit: Payer: Self-pay

## 2023-05-05 ENCOUNTER — Other Ambulatory Visit: Payer: Self-pay | Admitting: Nurse Practitioner

## 2023-05-05 MED ORDER — FINASTERIDE 5 MG PO TABS
5.0000 mg | ORAL_TABLET | Freq: Every day | ORAL | 0 refills | Status: DC
  Filled 2023-05-05: qty 30, 30d supply, fill #0

## 2023-05-05 NOTE — Telephone Encounter (Signed)
 Please advise KH 

## 2023-05-06 ENCOUNTER — Other Ambulatory Visit: Payer: Self-pay

## 2023-05-07 ENCOUNTER — Other Ambulatory Visit: Payer: Self-pay

## 2023-06-02 ENCOUNTER — Other Ambulatory Visit: Payer: Self-pay | Admitting: Nurse Practitioner

## 2023-06-02 ENCOUNTER — Other Ambulatory Visit: Payer: Self-pay

## 2023-06-03 ENCOUNTER — Other Ambulatory Visit: Payer: Self-pay

## 2023-06-03 MED ORDER — FINASTERIDE 5 MG PO TABS
5.0000 mg | ORAL_TABLET | Freq: Every day | ORAL | 0 refills | Status: DC
  Filled 2023-06-03: qty 30, 30d supply, fill #0

## 2023-07-05 ENCOUNTER — Other Ambulatory Visit: Payer: Self-pay

## 2023-07-05 ENCOUNTER — Other Ambulatory Visit: Payer: Self-pay | Admitting: Nurse Practitioner

## 2023-07-05 DIAGNOSIS — I10 Essential (primary) hypertension: Secondary | ICD-10-CM

## 2023-07-05 MED ORDER — FINASTERIDE 5 MG PO TABS
5.0000 mg | ORAL_TABLET | Freq: Every day | ORAL | 0 refills | Status: DC
  Filled 2023-07-05: qty 30, 30d supply, fill #0

## 2023-07-05 MED ORDER — PRAVASTATIN SODIUM 40 MG PO TABS
40.0000 mg | ORAL_TABLET | Freq: Every day | ORAL | 1 refills | Status: DC
  Filled 2023-07-05 – 2023-08-10 (×2): qty 90, 90d supply, fill #0

## 2023-07-06 ENCOUNTER — Other Ambulatory Visit: Payer: Self-pay

## 2023-07-07 ENCOUNTER — Other Ambulatory Visit: Payer: Self-pay | Admitting: Nurse Practitioner

## 2023-07-07 ENCOUNTER — Other Ambulatory Visit: Payer: Self-pay

## 2023-07-07 DIAGNOSIS — M109 Gout, unspecified: Secondary | ICD-10-CM

## 2023-07-07 NOTE — Telephone Encounter (Signed)
Please advise. kh

## 2023-07-08 ENCOUNTER — Other Ambulatory Visit: Payer: Self-pay

## 2023-07-08 MED ORDER — COLCHICINE 0.6 MG PO TABS
ORAL_TABLET | ORAL | 5 refills | Status: DC
  Filled 2023-07-08: qty 30, 10d supply, fill #0
  Filled 2023-10-05 (×3): qty 30, 10d supply, fill #1
  Filled 2024-05-10: qty 30, 10d supply, fill #2

## 2023-07-08 NOTE — Telephone Encounter (Signed)
Please advise Kh

## 2023-07-13 ENCOUNTER — Other Ambulatory Visit: Payer: Self-pay

## 2023-08-10 ENCOUNTER — Other Ambulatory Visit: Payer: Self-pay

## 2023-08-10 ENCOUNTER — Other Ambulatory Visit: Payer: Self-pay | Admitting: Nurse Practitioner

## 2023-08-16 ENCOUNTER — Other Ambulatory Visit: Payer: Self-pay

## 2023-08-16 ENCOUNTER — Other Ambulatory Visit (HOSPITAL_BASED_OUTPATIENT_CLINIC_OR_DEPARTMENT_OTHER): Payer: Self-pay

## 2023-08-19 ENCOUNTER — Other Ambulatory Visit: Payer: Self-pay

## 2023-10-05 ENCOUNTER — Other Ambulatory Visit: Payer: Self-pay

## 2023-10-05 ENCOUNTER — Other Ambulatory Visit: Payer: Self-pay | Admitting: Nurse Practitioner

## 2023-10-05 DIAGNOSIS — I10 Essential (primary) hypertension: Secondary | ICD-10-CM

## 2023-10-05 MED ORDER — LISINOPRIL 20 MG PO TABS
20.0000 mg | ORAL_TABLET | Freq: Every day | ORAL | 1 refills | Status: DC
  Filled 2023-10-05: qty 90, 90d supply, fill #0

## 2023-10-08 ENCOUNTER — Other Ambulatory Visit: Payer: Self-pay

## 2023-10-26 ENCOUNTER — Ambulatory Visit: Payer: Self-pay | Admitting: Nurse Practitioner

## 2023-10-26 ENCOUNTER — Other Ambulatory Visit: Payer: Self-pay

## 2023-10-26 ENCOUNTER — Encounter: Payer: Self-pay | Admitting: Nurse Practitioner

## 2023-10-26 VITALS — BP 160/83 | HR 63 | Wt 150.0 lb

## 2023-10-26 DIAGNOSIS — Z23 Encounter for immunization: Secondary | ICD-10-CM | POA: Diagnosis not present

## 2023-10-26 DIAGNOSIS — M79641 Pain in right hand: Secondary | ICD-10-CM

## 2023-10-26 DIAGNOSIS — K0889 Other specified disorders of teeth and supporting structures: Secondary | ICD-10-CM

## 2023-10-26 DIAGNOSIS — R202 Paresthesia of skin: Secondary | ICD-10-CM

## 2023-10-26 DIAGNOSIS — N401 Enlarged prostate with lower urinary tract symptoms: Secondary | ICD-10-CM | POA: Diagnosis not present

## 2023-10-26 DIAGNOSIS — I10 Essential (primary) hypertension: Secondary | ICD-10-CM | POA: Diagnosis not present

## 2023-10-26 DIAGNOSIS — M79642 Pain in left hand: Secondary | ICD-10-CM | POA: Insufficient documentation

## 2023-10-26 DIAGNOSIS — M109 Gout, unspecified: Secondary | ICD-10-CM

## 2023-10-26 DIAGNOSIS — G4701 Insomnia due to medical condition: Secondary | ICD-10-CM | POA: Diagnosis not present

## 2023-10-26 DIAGNOSIS — R350 Frequency of micturition: Secondary | ICD-10-CM

## 2023-10-26 DIAGNOSIS — N1831 Chronic kidney disease, stage 3a: Secondary | ICD-10-CM

## 2023-10-26 DIAGNOSIS — E785 Hyperlipidemia, unspecified: Secondary | ICD-10-CM

## 2023-10-26 MED ORDER — TRAZODONE HCL 50 MG PO TABS
50.0000 mg | ORAL_TABLET | Freq: Every evening | ORAL | 1 refills | Status: DC | PRN
  Filled 2023-10-26: qty 30, 30d supply, fill #0
  Filled 2024-01-20: qty 30, 30d supply, fill #1

## 2023-10-26 MED ORDER — CLONIDINE HCL 0.1 MG PO TABS
0.2000 mg | ORAL_TABLET | Freq: Once | ORAL | Status: AC
  Administered 2023-10-26: 0.2 mg via ORAL

## 2023-10-26 MED ORDER — AMLODIPINE BESYLATE 10 MG PO TABS
10.0000 mg | ORAL_TABLET | Freq: Every day | ORAL | 1 refills | Status: DC
  Filled 2023-10-26: qty 30, 30d supply, fill #0
  Filled 2024-01-04: qty 30, 30d supply, fill #1
  Filled 2024-02-08: qty 30, 30d supply, fill #2

## 2023-10-26 MED ORDER — PREGABALIN 50 MG PO CAPS
50.0000 mg | ORAL_CAPSULE | Freq: Three times a day (TID) | ORAL | 0 refills | Status: DC
  Filled 2023-10-26 (×2): qty 90, 30d supply, fill #0

## 2023-10-26 MED ORDER — FINASTERIDE 5 MG PO TABS
5.0000 mg | ORAL_TABLET | Freq: Every day | ORAL | 0 refills | Status: DC
  Filled 2023-10-26: qty 30, 30d supply, fill #0

## 2023-10-26 MED ORDER — LISINOPRIL 20 MG PO TABS
20.0000 mg | ORAL_TABLET | Freq: Every day | ORAL | 1 refills | Status: DC
  Filled 2023-10-26 – 2023-12-21 (×4): qty 90, 90d supply, fill #0

## 2023-10-26 MED ORDER — PRAVASTATIN SODIUM 40 MG PO TABS
40.0000 mg | ORAL_TABLET | Freq: Every day | ORAL | 1 refills | Status: DC
  Filled 2023-10-26: qty 30, 30d supply, fill #0
  Filled 2023-12-15: qty 30, 30d supply, fill #1
  Filled 2024-01-20: qty 30, 30d supply, fill #2
  Filled 2024-02-21: qty 30, 30d supply, fill #3

## 2023-10-26 MED ORDER — TAMSULOSIN HCL 0.4 MG PO CAPS
0.4000 mg | ORAL_CAPSULE | Freq: Every day | ORAL | 1 refills | Status: DC
  Filled 2023-10-26: qty 30, 30d supply, fill #0
  Filled 2024-01-05: qty 30, 30d supply, fill #1
  Filled 2024-01-11 – 2024-02-21 (×2): qty 30, 30d supply, fill #2

## 2023-10-26 MED ORDER — HYDROCHLOROTHIAZIDE 25 MG PO TABS
25.0000 mg | ORAL_TABLET | Freq: Every day | ORAL | 1 refills | Status: DC
  Filled 2023-10-26: qty 90, 90d supply, fill #0
  Filled 2024-02-08: qty 90, 90d supply, fill #1

## 2023-10-26 NOTE — Patient Instructions (Addendum)
 Around 3 times per week, check your blood pressure 2 times per day. once in the morning and once in the evening. The readings should be at least one minute apart. Write down these values and bring them to your next nurse visit/appointment.  When you check your BP, make sure you have been doing something calm/relaxing 5 minutes prior to checking. Both feet should be flat on the floor and you should be sitting. Use your left arm and make sure it is in a relaxed position (on a table), and that the cuff is at the approximate level/height of your heart.    1. Essential hypertension  - amLODipine  (NORVASC ) 10 MG tablet; Take 1 tablet (10 mg total) by mouth daily.  Dispense: 90 tablet; Refill: 1 - hydrochlorothiazide  (HYDRODIURIL ) 25 MG tablet; Take 1 tablet (25 mg total) by mouth daily.  Dispense: 90 tablet; Refill: 1 - lisinopril  (ZESTRIL ) 20 MG tablet; Take 1 tablet (20 mg total) by mouth daily.  Dispense: 90 tablet; Refill: 1  2. Hyperlipidemia, unspecified hyperlipidemia type  - pravastatin  (PRAVACHOL ) 40 MG tablet; Take 1 tablet (40 mg total) by mouth daily.  Dispense: 90 tablet; Refill: 1 - CBC - CMP14+EGFR  3. Benign prostatic hyperplasia with urinary frequency  - tamsulosin  (FLOMAX ) 0.4 MG CAPS capsule; Take 1 capsule (0.4 mg total) by mouth daily.  Dispense: 90 capsule; Refill: 1  4. Insomnia due to medical condition  - traZODone  (DESYREL ) 50 MG tablet; Take 1 tablet (50 mg total) by mouth at bedtime as needed for sleep.  Dispense: 90 tablet; Refill: 1  5. Need for influenza vaccination (Primary)  - Flu Vaccine QUAD High Dose(Fluad)  6. Paresthesia  - pregabalin  (LYRICA ) 50 MG capsule; Take 1 capsule (50 mg total) by mouth 3 (three) times daily.  Dispense: 90 capsule; Refill: 0  7. Pain in both hands  - pregabalin  (LYRICA ) 50 MG capsule; Take 1 capsule (50 mg total) by mouth 3 (three) times daily.  Dispense: 90 capsule; Refill: 0  8. Gout of ankle, unspecified cause,  unspecified chronicity, unspecified laterality  - Uric Acid  9. Pain, dental   It is important that you exercise regularly at least 30 minutes 5 times a week as tolerated  Think about what you will eat, plan ahead. Choose  clean, green, fresh or frozen over canned, processed or packaged foods which are more sugary, salty and fatty. 70 to 75% of food eaten should be vegetables and fruit. Three meals at set times with snacks allowed between meals, but they must be fruit or vegetables. Aim to eat over a 12 hour period , example 7 am to 7 pm, and STOP after  your last meal of the day. Drink water,generally about 64 ounces per day, no other drink is as healthy. Fruit juice is best enjoyed in a healthy way, by EATING the fruit.  Thanks for choosing [facility] we consider it a privelige to serve you.

## 2023-10-26 NOTE — Assessment & Plan Note (Addendum)
 BP Readings from Last 3 Encounters:  10/26/23 (!) 160/83  10/27/21 133/68  07/28/21 (!) 178/87   1. Essential hypertension  - amLODipine  (NORVASC ) 10 MG tablet; Take 1 tablet (10 mg total) by mouth daily.  Dispense: 90 tablet; Refill: 1 - hydrochlorothiazide  (HYDRODIURIL ) 25 MG tablet; Take 1 tablet (25 mg total) by mouth daily.  Dispense: 90 tablet; Refill: 1 - lisinopril  (ZESTRIL ) 20 MG tablet; Take 1 tablet (20 mg total) by mouth daily.  Dispense: 90 tablet; Refill: 1   HTN unControlled . Medications above refilled.  Clonidine  0.2mg  one-time dose given in the office today Patient encouraged to monitor blood pressure at home keep a log and follow-up in the office in 2 weeks Discussed DASH diet and dietary sodium restrictions Continue to increase dietary efforts and exercise.  Patient left the office in a stable condition Checking CMP

## 2023-10-26 NOTE — Assessment & Plan Note (Signed)
 Trazodone 50 mg at bedtime as needed refilled

## 2023-10-26 NOTE — Assessment & Plan Note (Signed)
 Will prescribe Augmentin, checking CMP to determine the appropriate dosage needed

## 2023-10-26 NOTE — Assessment & Plan Note (Signed)
 No acute gout attack Takes colchicine as needed Check uric acid level will plan to start patient on allopurinol if needed for gout prevention

## 2023-10-26 NOTE — Progress Notes (Addendum)
 Established Patient Office Visit  Subjective:  Patient ID: Daniel Haynes, male    DOB: [birth year]  Age: 73 y.o. MRN: [id number]  CC:  Chief Complaint  Patient presents with   Dental Problem    HPI Daniel Haynes is a 73 y.o. male  has a past medical history of Arthritis, Gout, Hyperlipidemia, Hypertension, and Insomnia (08/2019).  Patient presents to establish care for his chronic medical conditions.  Patient was last seen in this office in 2023.  Stated that he has stopped taking all his medications.  He had no reason for not taking his medication or following up in the office  Hypertension.  Currently has amlodipine  10 mg daily, hydrochlorothiazide  25 mg daily, lisinopril  20 mg daily ordered but has not been taking the medications.  He denies chest pain shortness of breath edema  Dental pain patient complains of dental pain for the past 2 weeks.  He denies fever, chills.  Stated that he has been to the dentist but he wants him to be treated with an antibiotics first.     Right-sided sciatica/hepatorenal syndrome on the right.  Was on Lyrica  50 mg 3 times daily    Past Medical History:  Diagnosis Date   Arthritis    hands fingers wrists    Gout    Hyperlipidemia    Hypertension    Insomnia 08/2019    Past Surgical History:  Procedure Laterality Date   CERVICAL DISCECTOMY  10/19/2002   ACDF C3 to C6 [facility]    Family History  Problem Relation Age of Onset   Cancer Mother    Cancer Father    Colon cancer Neg Hx    Colon polyps Neg Hx    Esophageal cancer Neg Hx    Rectal cancer Neg Hx    Stomach cancer Neg Hx     Social History   Socioeconomic History   Marital status: Single    Spouse name: Not on file   Number of children: Not on file   Years of education: Not on file   Highest education level: Not on file  Occupational History   Not on file  Tobacco Use   Smoking status: Some Days    Current packs/day: 0.25    Types: Cigarettes   Smokeless tobacco: Never    Tobacco comments:    6 cigs  per week.  Vaping Use   Vaping status: Never Used  Substance and Sexual Activity   Alcohol use: Yes    Alcohol/week: 21.0 standard drinks of alcohol    Types: 21 Shots of liquor per week    Comment: 3 a day    Drug use: Yes    Types: Marijuana    Comment: occ use    Sexual activity: Not Currently  Other Topics Concern   Not on file  Social History Narrative   Lives with his son    Social Drivers of Corporate Investment Banker Strain: Not on file  Food Insecurity: Not on file  Transportation Needs: Not on file  Physical Activity: Not on file  Stress: Not on file  Social Connections: Not on file  Intimate Partner Violence: Not on file    Outpatient Medications Prior to Visit  Medication Sig Dispense Refill   amLODipine  (NORVASC ) 10 MG tablet Take 1 tablet (10 mg total) by mouth daily. 90 tablet 1   colchicine  0.6 MG tablet TAKE 2 TABLETS (1.2MG ) BY MOUTH AT FIRST SIGN OF FLARE,FOLLOWED BY 1 TABLET (0.6MG ) AFTER 1  HOUR (Patient not taking: Reported on 10/26/2023) 30 tablet 5   amLODipine  (NORVASC ) 10 MG tablet Take 1 tablet (10 mg total) by mouth daily. (Patient not taking: Reported on 10/26/2023) 90 tablet 1   finasteride  (PROSCAR ) 5 MG tablet Take 1 tablet (5 mg total) by mouth daily. (Patient not taking: Reported on 10/26/2023) 30 tablet 0   hydrochlorothiazide  (HYDRODIURIL ) 25 MG tablet Take 1 tablet (25 mg total) by mouth daily. (Patient not taking: Reported on 10/26/2023) 90 tablet 1   lisinopril  (ZESTRIL ) 20 MG tablet Take 1 tablet (20 mg total) by mouth daily. (Patient not taking: Reported on 10/26/2023) 90 tablet 1   methylPREDNISolone  (MEDROL ) 4 MG tablet take 6 tablets by mouth day 1, 5 tabs day 2, 4 tabs day 3, 3 tabs day 4, 2 tabs day 5 and 1 tab day 6  as directed  (Patient not taking: Reported on 10/26/2023) 21 tablet 0   pravastatin  (PRAVACHOL ) 40 MG tablet Take 1 tablet (40 mg total) by mouth daily. (Patient not taking: Reported on 10/26/2023) 90  tablet 1   pravastatin  (PRAVACHOL ) 40 MG tablet Take 1 tablet (40 mg total) by mouth daily. (Patient not taking: Reported on 10/26/2023) 90 tablet 1   pregabalin  (LYRICA ) 50 MG capsule Take 1 capsule (50 mg total) by mouth 3 (three) times daily. (Patient not taking: Reported on 10/26/2023) 90 capsule 0   tamsulosin  (FLOMAX ) 0.4 MG CAPS capsule Take 1 capsule (0.4 mg total) by mouth daily. (Patient not taking: Reported on 10/26/2023) 90 capsule 1   traMADol  (ULTRAM ) 50 MG tablet Take 1 tablet (50 mg total) by mouth every 8 (eight) hours as needed. (Patient not taking: Reported on 10/26/2023) 30 tablet 0   traZODone  (DESYREL ) 50 MG tablet Take 1 tablet (50 mg total) by mouth at bedtime as needed for sleep. (Patient not taking: Reported on 10/26/2023) 90 tablet 1   No facility-administered medications prior to visit.    No Known Allergies  ROS Review of Systems  Constitutional:  Negative for appetite change, chills, fatigue and fever.  HENT:  Positive for dental problem. Negative for congestion, postnasal drip, rhinorrhea and sneezing.   Respiratory:  Negative for cough, shortness of breath and wheezing.   Cardiovascular:  Negative for chest pain, palpitations and leg swelling.  Gastrointestinal:  Negative for abdominal pain, constipation, nausea and vomiting.  Genitourinary:  Negative for difficulty urinating, dysuria, flank pain and frequency.  Musculoskeletal:  Positive for arthralgias. Negative for back pain, joint swelling and myalgias.  Skin:  Negative for color change, pallor, rash and wound.  Neurological:  Negative for dizziness, facial asymmetry, weakness, numbness and headaches.  Psychiatric/Behavioral:  Negative for behavioral problems, confusion, self-injury and suicidal ideas.       Objective:    Physical Exam Vitals and nursing note reviewed.  Constitutional:      General: He is not in acute distress.    Appearance: Normal appearance. He is obese. He is not ill-appearing,  toxic-appearing or diaphoretic.  HENT:     Head:     Comments: Poor dental hygiene, missing teeth,    Mouth/Throat:     Mouth: Mucous membranes are moist.     Dentition: Abnormal dentition. Dental tenderness, gingival swelling and dental caries present.     Pharynx: Oropharynx is clear. No oropharyngeal exudate or posterior oropharyngeal erythema.  Eyes:     General: No scleral icterus.       Right eye: No discharge.        Left  eye: No discharge.     Extraocular Movements: Extraocular movements intact.     Conjunctiva/sclera: Conjunctivae normal.  Cardiovascular:     Rate and Rhythm: Normal rate and regular rhythm.     Pulses: Normal pulses.     Heart sounds: Normal heart sounds. No murmur heard.    No friction rub. No gallop.  Pulmonary:     Effort: Pulmonary effort is normal. No respiratory distress.     Breath sounds: Normal breath sounds. No stridor. No wheezing, rhonchi or rales.  Chest:     Chest wall: No tenderness.  Abdominal:     General: There is no distension.     Palpations: Abdomen is soft.     Tenderness: There is no abdominal tenderness. There is no right CVA tenderness, left CVA tenderness or guarding.  Musculoskeletal:        General: No swelling, deformity or signs of injury.     Right lower leg: No edema.     Left lower leg: No edema.  Skin:    General: Skin is warm and dry.     Capillary Refill: Capillary refill takes less than 2 seconds.     Coloration: Skin is not jaundiced or pale.     Findings: No bruising, erythema or lesion.  Neurological:     Mental Status: He is alert and oriented to person, place, and time.     Motor: No weakness.     Coordination: Coordination normal.     Gait: Gait normal.  Psychiatric:        Mood and Affect: Mood normal.        Behavior: Behavior normal.        Thought Content: Thought content normal.        Judgment: Judgment normal.     BP (!) 160/83   Pulse 63   Wt 150 lb (68 kg)   SpO2 99%   BMI 24.21 kg/m   Wt Readings from Last 3 Encounters:  10/26/23 150 lb (68 kg)  10/27/21 156 lb 9.6 oz (71 kg)  07/28/21 153 lb (69.4 kg)    Lab Results  Component Value Date   TSH 0.744 08/25/2019   Lab Results  Component Value Date   WBC 6.5 07/28/2021   HGB 11.6 (L) 07/28/2021   HCT 35.3 (L) 07/28/2021   MCV 89 07/28/2021   PLT 293 07/28/2021   Lab Results  Component Value Date   NA 139 02/03/2021   K 4.2 02/03/2021   CO2 23 06/29/2017   GLUCOSE 125 (H) 02/03/2021   BUN 38 (H) 02/03/2021   CREATININE 1.96 (H) 02/03/2021   BILITOT <0.2 02/03/2021   ALKPHOS 139 (H) 02/03/2021   AST 12 02/03/2021   ALT 17 06/29/2017   PROT 6.7 02/03/2021   ALBUMIN 3.9 02/03/2021   CALCIUM  9.2 02/03/2021   ANIONGAP 13 12/30/2015   EGFR 36 (L) 02/03/2021   Lab Results  Component Value Date   CHOL 197 03/11/2020   Lab Results  Component Value Date   HDL 54 03/11/2020   Lab Results  Component Value Date   LDLCALC 116 (H) 03/11/2020   Lab Results  Component Value Date   TRIG 155 (H) 03/11/2020   Lab Results  Component Value Date   CHOLHDL 3.6 03/11/2020   Lab Results  Component Value Date   HGBA1C 5.1 08/25/2019      Assessment & Plan:   Problem List Items Addressed This Visit       Cardiovascular  and Mediastinum   Accelerated hypertension - Primary   BP Readings from Last 3 Encounters:  10/26/23 (!) 160/83  10/27/21 133/68  07/28/21 (!) 178/87   1. Essential hypertension  - amLODipine  (NORVASC ) 10 MG tablet; Take 1 tablet (10 mg total) by mouth daily.  Dispense: 90 tablet; Refill: 1 - hydrochlorothiazide  (HYDRODIURIL ) 25 MG tablet; Take 1 tablet (25 mg total) by mouth daily.  Dispense: 90 tablet; Refill: 1 - lisinopril  (ZESTRIL ) 20 MG tablet; Take 1 tablet (20 mg total) by mouth daily.  Dispense: 90 tablet; Refill: 1   HTN unControlled . Medications above refilled.  Clonidine  0.2mg  one-time dose given in the office today Patient encouraged to monitor blood pressure at  home keep a log and follow-up in the office in 2 weeks Discussed DASH diet and dietary sodium restrictions Continue to increase dietary efforts and exercise.  Patient left the office in a stable condition Checking CMP         Relevant Medications   amLODipine  (NORVASC ) 10 MG tablet   hydrochlorothiazide  (HYDRODIURIL ) 25 MG tablet   pravastatin  (PRAVACHOL ) 40 MG tablet   lisinopril  (ZESTRIL ) 20 MG tablet     Genitourinary   Chronic kidney disease (CKD) stage G3a/A1, moderately decreased glomerular filtration rate (GFR) between 45-59 mL/min/1.73 square meter and albuminuria creatinine ratio less than 30 mg/g (HCC)   Lab Results  Component Value Date   NA 139 02/03/2021   K 4.2 02/03/2021   CO2 23 06/29/2017   GLUCOSE 125 (H) 02/03/2021   BUN 38 (H) 02/03/2021   CREATININE 1.96 (H) 02/03/2021   CALCIUM  9.2 02/03/2021   EGFR 36 (L) 02/03/2021   GFRNONAA 49 (L) 09/02/2020  Checking CMP today Avoid NSAIDs and other nephrotoxic agents Encouraged to drink at least 64 ounces of water daily to maintain hydration Lisinopril  20 mg daily refilled        Other   Gout   No acute gout attack Takes colchicine  as needed Check uric acid level will plan to start patient on allopurinol if needed for gout prevention      Relevant Orders   Uric Acid   Urinary frequency   Flomax  0.4 mg, Proscar  5 mg daily refilled      Paresthesia   Medication refilled - pregabalin  (LYRICA ) 50 MG capsule; Take 1 capsule (50 mg total) by mouth 3 (three) times daily.  Dispense: 90 capsule; Refill: 0       Relevant Medications   pregabalin  (LYRICA ) 50 MG capsule   Pain, dental   Will prescribe Augmentin , checking CMP to determine the appropriate dosage needed      Pain in both hands   Relevant Medications   pregabalin  (LYRICA ) 50 MG capsule   Insomnia due to medical condition   Trazodone  50 mg at bedtime as needed refilled      Relevant Medications   traZODone  (DESYREL ) 50 MG tablet   Benign  prostatic hyperplasia with urinary frequency   Medication refilled - tamsulosin  (FLOMAX ) 0.4 MG CAPS capsule; Take 1 capsule (0.4 mg total) by mouth daily.  Dispense: 90 capsule; Refill: 1       Relevant Medications   tamsulosin  (FLOMAX ) 0.4 MG CAPS capsule   Hyperlipidemia   Pravastatin  40 mg daily refilled We will check lipid panel at next visit      Relevant Medications   amLODipine  (NORVASC ) 10 MG tablet   hydrochlorothiazide  (HYDRODIURIL ) 25 MG tablet   pravastatin  (PRAVACHOL ) 40 MG tablet   lisinopril  (ZESTRIL ) 20 MG tablet  Other Relevant Orders   CBC   CMP14+EGFR   Need for influenza vaccination   Patient educated on CDC recommendation for the vaccine. Verbal consent was obtained from the patient, vaccine administered by nurse, no sign of adverse reactions noted at this time. Patient education on arm soreness and use of tylenol   for this patient  was discussed. Patient educated on the signs and symptoms of adverse effect and advise to contact the office if they occur. Vaccine information sheet given to patient.        Relevant Orders   Flu Vaccine QUAD High Dose(Fluad) (Completed)    Meds ordered this encounter  Medications   amLODipine  (NORVASC ) 10 MG tablet    Sig: Take 1 tablet (10 mg total) by mouth daily.    Dispense:  90 tablet    Refill:  1   finasteride  (PROSCAR ) 5 MG tablet    Sig: Take 1 tablet (5 mg total) by mouth daily.    Dispense:  30 tablet    Refill:  0   hydrochlorothiazide  (HYDRODIURIL ) 25 MG tablet    Sig: Take 1 tablet (25 mg total) by mouth daily.    Dispense:  90 tablet    Refill:  1   pravastatin  (PRAVACHOL ) 40 MG tablet    Sig: Take 1 tablet (40 mg total) by mouth daily.    Dispense:  90 tablet    Refill:  1   tamsulosin  (FLOMAX ) 0.4 MG CAPS capsule    Sig: Take 1 capsule (0.4 mg total) by mouth daily.    Dispense:  90 capsule    Refill:  1   traZODone  (DESYREL ) 50 MG tablet    Sig: Take 1 tablet (50 mg total) by mouth at bedtime  as needed for sleep.    Dispense:  90 tablet    Refill:  1   lisinopril  (ZESTRIL ) 20 MG tablet    Sig: Take 1 tablet (20 mg total) by mouth daily.    Dispense:  90 tablet    Refill:  1   pregabalin  (LYRICA ) 50 MG capsule    Sig: Take 1 capsule (50 mg total) by mouth 3 (three) times daily.    Dispense:  90 capsule    Refill:  0    Do not place this medication, or any other prescription from our practice, on Automatic Refill. Patient may have prescription filled one day early if pharmacy is closed on scheduled refill date.   cloNIDine  (CATAPRES ) tablet 0.2 mg    Follow-up: Return in about 2 weeks (around 11/09/2023) for HTN.    Kourtlyn Charlet R Essense Bousquet, FNP

## 2023-10-26 NOTE — Assessment & Plan Note (Signed)
 Pravastatin 40 mg daily refilled We will check lipid panel at next visit

## 2023-10-26 NOTE — Assessment & Plan Note (Signed)
 Medication refilled - tamsulosin (FLOMAX) 0.4 MG CAPS capsule; Take 1 capsule (0.4 mg total) by mouth daily.  Dispense: 90 capsule; Refill: 1

## 2023-10-26 NOTE — Assessment & Plan Note (Signed)
 Lab Results  Component Value Date   NA 139 02/03/2021   K 4.2 02/03/2021   CO2 23 06/29/2017   GLUCOSE 125 (H) 02/03/2021   BUN 38 (H) 02/03/2021   CREATININE 1.96 (H) 02/03/2021   CALCIUM  9.2 02/03/2021   EGFR 36 (L) 02/03/2021   GFRNONAA 49 (L) 09/02/2020  Checking CMP today Avoid NSAIDs and other nephrotoxic agents Encouraged to drink at least 64 ounces of water daily to maintain hydration Lisinopril  20 mg daily refilled

## 2023-10-26 NOTE — Assessment & Plan Note (Signed)
 Medication refilled - pregabalin (LYRICA) 50 MG capsule; Take 1 capsule (50 mg total) by mouth 3 (three) times daily.  Dispense: 90 capsule; Refill: 0

## 2023-10-26 NOTE — Assessment & Plan Note (Signed)

## 2023-10-26 NOTE — Assessment & Plan Note (Signed)
 Flomax 0.4 mg, Proscar 5 mg daily refilled

## 2023-10-27 ENCOUNTER — Other Ambulatory Visit: Payer: Self-pay

## 2023-10-27 ENCOUNTER — Other Ambulatory Visit: Payer: Self-pay | Admitting: Nurse Practitioner

## 2023-10-27 DIAGNOSIS — K0889 Other specified disorders of teeth and supporting structures: Secondary | ICD-10-CM

## 2023-10-27 LAB — CMP14+EGFR
ALT: 36 [IU]/L (ref 0–44)
AST: 34 [IU]/L (ref 0–40)
Albumin: 4.4 g/dL (ref 3.8–4.8)
Alkaline Phosphatase: 111 [IU]/L (ref 44–121)
BUN/Creatinine Ratio: 16 (ref 10–24)
BUN: 22 mg/dL (ref 8–27)
Bilirubin Total: 0.3 mg/dL (ref 0.0–1.2)
CO2: 26 mmol/L (ref 20–29)
Calcium: 9.2 mg/dL (ref 8.6–10.2)
Chloride: 103 mmol/L (ref 96–106)
Creatinine, Ser: 1.36 mg/dL — ABNORMAL HIGH (ref 0.76–1.27)
Globulin, Total: 2.6 g/dL (ref 1.5–4.5)
Glucose: 90 mg/dL (ref 70–99)
Potassium: 4.6 mmol/L (ref 3.5–5.2)
Sodium: 143 mmol/L (ref 134–144)
Total Protein: 7 g/dL (ref 6.0–8.5)
eGFR: 55 mL/min/{1.73_m2} — ABNORMAL LOW (ref >59)

## 2023-10-27 LAB — CBC
Hematocrit: 44.3 % (ref 37.5–51.0)
Hemoglobin: 14.6 g/dL (ref 13.0–17.7)
MCH: 30.8 pg (ref 26.6–33.0)
MCHC: 33 g/dL (ref 31.5–35.7)
MCV: 94 fL (ref 79–97)
Platelets: 310 10*3/uL (ref 150–450)
RBC: 4.74 x10E6/uL (ref 4.14–5.80)
RDW: 12.4 % (ref 11.6–15.4)
WBC: 5.6 10*3/uL (ref 3.4–10.8)

## 2023-10-27 LAB — URIC ACID: Uric Acid: 7 mg/dL (ref 3.8–8.4)

## 2023-10-27 MED ORDER — AMOXICILLIN-POT CLAVULANATE 875-125 MG PO TABS
1.0000 | ORAL_TABLET | Freq: Two times a day (BID) | ORAL | 0 refills | Status: AC
  Filled 2023-10-27: qty 14, 7d supply, fill #0

## 2023-10-28 ENCOUNTER — Other Ambulatory Visit: Payer: Self-pay

## 2023-11-12 ENCOUNTER — Ambulatory Visit: Payer: Self-pay | Admitting: Nurse Practitioner

## 2023-11-12 ENCOUNTER — Encounter (HOSPITAL_COMMUNITY): Payer: Self-pay | Admitting: Emergency Medicine

## 2023-11-12 ENCOUNTER — Other Ambulatory Visit: Payer: Self-pay

## 2023-11-12 ENCOUNTER — Emergency Department (HOSPITAL_COMMUNITY)
Admission: EM | Admit: 2023-11-12 | Discharge: 2023-11-12 | Disposition: A | Discharge status: Home / Self Care | Payer: Medicare PPO | Attending: Emergency Medicine | Admitting: Emergency Medicine

## 2023-11-12 DIAGNOSIS — K0889 Other specified disorders of teeth and supporting structures: Secondary | ICD-10-CM | POA: Insufficient documentation

## 2023-11-12 DIAGNOSIS — I129 Hypertensive chronic kidney disease with stage 1 through stage 4 chronic kidney disease, or unspecified chronic kidney disease: Secondary | ICD-10-CM | POA: Insufficient documentation

## 2023-11-12 DIAGNOSIS — Z79899 Other long term (current) drug therapy: Secondary | ICD-10-CM | POA: Diagnosis not present

## 2023-11-12 DIAGNOSIS — I10 Essential (primary) hypertension: Secondary | ICD-10-CM | POA: Diagnosis not present

## 2023-11-12 DIAGNOSIS — N189 Chronic kidney disease, unspecified: Secondary | ICD-10-CM | POA: Insufficient documentation

## 2023-11-12 DIAGNOSIS — R030 Elevated blood-pressure reading, without diagnosis of hypertension: Secondary | ICD-10-CM | POA: Insufficient documentation

## 2023-11-12 MED ORDER — HYDROCODONE-ACETAMINOPHEN 5-325 MG PO TABS
1.0000 | ORAL_TABLET | Freq: Once | ORAL | Status: AC
  Administered 2023-11-12: 1 via ORAL
  Filled 2023-11-12: qty 1

## 2023-11-12 MED ORDER — OXYCODONE HCL 5 MG PO CAPS
5.0000 mg | ORAL_CAPSULE | Freq: Four times a day (QID) | ORAL | 0 refills | Status: DC | PRN
  Filled 2023-11-12: qty 15, 4d supply, fill #0

## 2023-11-12 MED ORDER — OXYCODONE HCL 5 MG PO TABS
5.0000 mg | ORAL_TABLET | Freq: Four times a day (QID) | ORAL | 0 refills | Status: DC | PRN
  Filled 2023-11-12: qty 15, 4d supply, fill #0

## 2023-11-12 NOTE — ED Triage Notes (Signed)
Pt here from home with c/o right upper dental pain , has had problems finding a dentist

## 2023-11-12 NOTE — ED Provider Notes (Signed)
[place] AT [facility] Provider Note   CSN: [id number] Arrival date & time: 11/12/23  1202     History  Chief Complaint  Patient presents with   Dental Pain    Daniel Haynes is a 73 y.o. male.  Patient with history of htn, CKD, hyperlipidemia presents today with complaints of dental pain. He states that same has been ongoing for the past few weeks. States that he has seen a dentist, however he was told he needs to have all his teeth extracted by an oral surgeon and they are unable to see him for some time. He was given a course of amoxicillin which he just completed a few days ago.  This did not improve his pain.  He states that he is having significant difficulty eating and sleeping due to uncontrolled pain despite regular Tylenol use.  He has been told he cannot take NSAIDs due to his kidney disease and hypertension.  Of note, patient is notably hypertensive today, states that he has an appointment with his primary care doctor in 1 hour to discuss management of this.  He did take his antihypertensives this morning as prescribed.  He denies fevers or chills. No chest pain or shortness of breath. No significant facial swelling, difficulty opening or closing his mouth or swelling within his mouth.  The history is provided by the patient. No language interpreter was used.  Dental Pain      Home Medications Prior to Admission medications   Medication Sig Start Date End Date Taking? Authorizing Provider  amLODipine (NORVASC) 10 MG tablet Take 1 tablet (10 mg total) by mouth daily. 10/26/23   Paseda, Baird Kay, FNP  colchicine 0.6 MG tablet TAKE 2 TABLETS (1.2MG ) BY MOUTH AT FIRST SIGN OF FLARE,FOLLOWED BY 1 TABLET (0.6MG ) AFTER 1 HOUR Patient not taking: Reported on 10/26/2023 07/08/23   Ivonne Andrew, NP  finasteride (PROSCAR) 5 MG tablet Take 1 tablet (5 mg total) by mouth daily. 10/26/23   Paseda, Baird Kay, FNP  hydrochlorothiazide (HYDRODIURIL) 25 MG  tablet Take 1 tablet (25 mg total) by mouth daily. 10/26/23 04/23/24  Donell Beers, FNP  lisinopril (ZESTRIL) 20 MG tablet Take 1 tablet (20 mg total) by mouth daily. 10/26/23   Donell Beers, FNP  pravastatin (PRAVACHOL) 40 MG tablet Take 1 tablet (40 mg total) by mouth daily. 10/26/23   Paseda, Baird Kay, FNP  pregabalin (LYRICA) 50 MG capsule Take 1 capsule (50 mg total) by mouth 3 (three) times daily. 10/26/23   Donell Beers, FNP  tamsulosin (FLOMAX) 0.4 MG CAPS capsule Take 1 capsule (0.4 mg total) by mouth daily. 10/26/23   Donell Beers, FNP  traZODone (DESYREL) 50 MG tablet Take 1 tablet (50 mg total) by mouth at bedtime as needed for sleep. 10/26/23 04/23/24  Donell Beers, FNP      Allergies    Patient has no known allergies.    Review of Systems   Review of Systems  HENT:  Positive for dental problem.   All other systems reviewed and are negative.   Physical Exam Updated Vital Signs BP (!) 183/81 (BP Location: Left Arm)   Pulse 69   Temp 98.2 F (36.8 C) (Oral)   Resp 18   Ht 5\' 5"  (1.651 m)   Wt 67.1 kg   SpO2 100%   BMI 24.63 kg/m  Physical Exam Vitals and nursing note reviewed.  Constitutional:      General: He is  not in acute distress.    Appearance: Normal appearance. He is normal weight. He is not ill-appearing, toxic-appearing or diaphoretic.  HENT:     Head: Normocephalic and atraumatic.     Mouth/Throat:     Comments: Dentition poor throughout with numerous dental caries.  Tenderness noted to right upper molar.  No significant facial swelling, or visualized gross abscess.  No intraoral swelling or swelling underneath tongue.  Patient tolerating secretions. Cardiovascular:     Rate and Rhythm: Normal rate.  Pulmonary:     Effort: Pulmonary effort is normal. No respiratory distress.  Musculoskeletal:        General: Normal range of motion.     Cervical back: Normal range of motion.  Skin:    General: Skin is warm and dry.   Neurological:     General: No focal deficit present.     Mental Status: He is alert.  Psychiatric:        Mood and Affect: Mood normal.        Behavior: Behavior normal.     ED Results / Procedures / Treatments   Labs (all labs ordered are listed, but only abnormal results are displayed) Labs Reviewed - No data to display  EKG None  Radiology No results found.  Procedures Procedures    Medications Ordered in ED Medications  HYDROcodone-acetaminophen (NORCO/VICODIN) 5-325 MG per tablet 1 tablet (has no administration in time range)    ED Course/ Medical Decision Making/ A&P                                 Medical Decision Making Risk Prescription drug management.   Patient presents today with complaints of dental pain over the past few weeks. He is afebrile, non-toxic appearing, and in no acute distress with reassuring vital signs. Physical exam reveals dentition poor throughout with numerous dental caries.  Tenderness noted to palpation of the right upper molar.  No gross abscess, trismus, or intraoral swelling.  Patient tolerating secretions.  Exam unconcerning for Ludwig's angina or spread of infection.  He just completed a course of amoxicillin which should cover for any infection.  Pain not adequately controlled with Tylenol and unfortunately due to patient's kidney disease he is unable to take NSAIDs.  Offered a few doses of oxycodone for pain control. PDMP reviewed, patient advised to not drive or operate heavy machinery while taking this medication as well as the sedating properties of narcotics and his increased fall risk while taking them. Advised to mostly take these to help with sleep at night. Urged patient to follow-up with dentist. Resources given for same.   Additionally, patient notably hypertensive today despite compliance with home antihypertensive medication. He denies any chest pain or shortness of breath. Still urinating regularly. No leg swelling. No  signs of hypertensive emergency or indications for further evaluation with labs or imaging. He has an appointment in 1 hour to further discuss management of his blood pressure. Emphasized importance of discussing this at this appointment  Evaluation and diagnostic testing in the emergency department does not suggest an emergent condition requiring admission or immediate intervention beyond what has been performed at this time.  Plan for discharge with close PCP follow-up.  Patient is understanding and amenable with plan, educated on red flag symptoms that would prompt immediate return.  Patient discharged in stable condition.  Final Clinical Impression(s) / ED Diagnoses Final diagnoses:  Pain, dental  Elevated  blood pressure reading    Rx / DC Orders ED Discharge Orders          Ordered    oxycodone (OXY-IR) 5 MG capsule  Every 6 hours PRN        11/12/23 1259          An After Visit Summary was printed and given to the patient.     Vear Clock 11/12/23 1307    Tegeler, Canary Brim, MD 11/12/23 [phone]

## 2023-11-12 NOTE — Discharge Instructions (Addendum)
You were seen in the emergency department for dental pain.  As we discussed I think your pain is likely related to your numerous dental caries. We normally treat this with anti-inflammatories, prescribed pain medication.  Do not drive or operate heavy machinery while taking this medication as it can be sedating.  Please be advised of the increased fall risk while taking this medication as well and please be sure to only take it when your pain is severe or to help you sleep at night.  I've attached a resource guide with several dentists in the area. It's incredibly important you follow up with a dentist as soon as possible for definitive treatment.   Additionally, your blood pressure was notably high today.  Please discuss management of this with your primary doctor at your appointment coming up.  Continue to monitor how you're doing and return to the ER for new or worsening symptoms such as difficulty swallowing your own saliva, difficulty breathing, or fever.

## 2023-11-18 ENCOUNTER — Telehealth: Payer: Self-pay

## 2023-11-18 NOTE — Telephone Encounter (Signed)
Copied from CRM [id number]. Topic: Clinical - Prescription Issue >> Nov 18, 2023  1:49 PM Monisha R wrote: Reason for CRM:  Patient wants to know can another order of Amoxicillin be called in for him. Due to the infection not gone.

## 2023-11-19 ENCOUNTER — Encounter: Payer: Self-pay | Admitting: Nurse Practitioner

## 2023-11-19 NOTE — Telephone Encounter (Signed)
Copied from CRM [id number]. Topic: Clinical - Prescription Issue >> Nov 19, 2023 11:01 AM Daniel Haynes wrote: Reason for CRM: patient is calling because he was prescribed a rx for amoxicillin about 2 weeks ago but the infection not all gone he wants another rx  please call patient [phone]

## 2023-11-19 NOTE — Telephone Encounter (Signed)
Copied from CRM [id number]. Topic: Clinical - Prescription Issue >> Nov 19, 2023 11:01 AM Daniel Haynes wrote: Reason for CRM: patient is calling because he was prescribed a rx for amoxicillin about 2 weeks ago but the infection not all gone he wants another rx  please call patient [phone] This encounter was created in error - please disregard.

## 2023-11-23 ENCOUNTER — Other Ambulatory Visit: Payer: Self-pay

## 2023-11-23 ENCOUNTER — Telehealth: Payer: Self-pay

## 2023-11-23 MED ORDER — CHLORHEXIDINE GLUCONATE 0.12 % MT SOLN
Freq: Two times a day (BID) | OROMUCOSAL | 0 refills | Status: AC
  Filled 2023-11-23: qty 473, 16d supply, fill #0

## 2023-11-23 MED ORDER — AMOXICILLIN 500 MG PO CAPS
500.0000 mg | ORAL_CAPSULE | Freq: Three times a day (TID) | ORAL | 0 refills | Status: AC
  Filled 2023-11-23: qty 21, 7d supply, fill #0

## 2023-11-23 MED ORDER — IBUPROFEN 800 MG PO TABS
800.0000 mg | ORAL_TABLET | Freq: Three times a day (TID) | ORAL | 0 refills | Status: DC | PRN
  Filled 2023-11-23: qty 20, 7d supply, fill #0

## 2023-11-25 NOTE — Telephone Encounter (Signed)
 See note

## 2023-12-04 ENCOUNTER — Encounter (HOSPITAL_COMMUNITY): Payer: Self-pay | Admitting: *Deleted

## 2023-12-04 ENCOUNTER — Emergency Department (HOSPITAL_COMMUNITY)
Admission: EM | Admit: 2023-12-04 | Discharge: 2023-12-04 | Disposition: A | Discharge status: Home / Self Care | Payer: Medicare PPO | Attending: Emergency Medicine | Admitting: Emergency Medicine

## 2023-12-04 ENCOUNTER — Other Ambulatory Visit: Payer: Self-pay

## 2023-12-04 DIAGNOSIS — G501 Atypical facial pain: Secondary | ICD-10-CM | POA: Diagnosis not present

## 2023-12-04 DIAGNOSIS — R519 Headache, unspecified: Secondary | ICD-10-CM

## 2023-12-04 DIAGNOSIS — K0889 Other specified disorders of teeth and supporting structures: Secondary | ICD-10-CM | POA: Diagnosis present

## 2023-12-04 DIAGNOSIS — G500 Trigeminal neuralgia: Secondary | ICD-10-CM | POA: Diagnosis not present

## 2023-12-04 MED ORDER — CARBAMAZEPINE ER 100 MG PO CP12: 100.0000 mg | ORAL_CAPSULE | Freq: Two times a day (BID) | ORAL | 0 refills | Status: DC | PRN

## 2023-12-04 NOTE — ED Provider Notes (Signed)
[place] EMERGENCY DEPARTMENT AT [facility] Provider Note   CSN: [id number] Arrival date & time: 12/04/23  1653     History  Chief Complaint  Patient presents with   Dental Pain    Daniel Haynes is a 73 y.o. male.   Dental Pain   73 year old male presents emergency department complaints of facial pain.  Patient reports having pain for the past month or so.  Also began having dental pain over the past couple of weeks.  Had right upper teeth removed but is still had burning type facial pain.  Reports pain radiating from his ear to his right upper lip as well as sometimes down his right jawline.  States that pain is somewhat intermittent in nature but feels burning/stinging when it occurs.  States that he felt like it was related to his bad teeth that symptoms persisted despite having teeth removed.  Patient states that when the pain "flares up" he tries to guard the right side of his face and not move it as much.  Family initially concerned earlier today about facial droop but patient states he was in pain and did not want to move the right side of his face.  Denies any visual disturbance, gait abnormality, weakness/sensory deficits in upper extremities.  Past medical history significant for hypertension, CKD, BPH, hyperlipidemia  Home Medications Prior to Admission medications   Medication Sig Start Date End Date Taking? Authorizing Provider  carbamazepine (CARBATROL) 100 MG 12 hr capsule Take 1 capsule (100 mg total) by mouth 2 (two) times daily as needed (facial pain). 12/04/23  Yes Sherian Maroon A, PA  amLODipine (NORVASC) 10 MG tablet Take 1 tablet (10 mg total) by mouth daily. 10/26/23   Donell Beers, FNP  chlorhexidine (PERIDEX) 0.12 % solution Swish 1 capful for one minute and spit, twice daly for 10 days. 11/23/23 12/09/23    colchicine 0.6 MG tablet TAKE 2 TABLETS (1.2MG ) BY MOUTH AT FIRST SIGN OF FLARE,FOLLOWED BY 1 TABLET (0.6MG ) AFTER 1 HOUR Patient not  taking: Reported on 10/26/2023 07/08/23   Ivonne Andrew, NP  finasteride (PROSCAR) 5 MG tablet Take 1 tablet (5 mg total) by mouth daily. 10/26/23   Paseda, Baird Kay, FNP  hydrochlorothiazide (HYDRODIURIL) 25 MG tablet Take 1 tablet (25 mg total) by mouth daily. 10/26/23 04/23/24  Donell Beers, FNP  ibuprofen (ADVIL) 800 MG tablet Take 1 tablet (800 mg total) by mouth every 8 (eight) hours as needed for pain. 11/23/23     lisinopril (ZESTRIL) 20 MG tablet Take 1 tablet (20 mg total) by mouth daily. 10/26/23   Donell Beers, FNP  oxyCODONE (ROXICODONE) 5 MG immediate release tablet Take 1 tablet (5 mg total) by mouth every 6 (six) hours as needed for severe pain (pain score 7-10). 11/12/23   Smoot, Shawn Route, PA-C  pravastatin (PRAVACHOL) 40 MG tablet Take 1 tablet (40 mg total) by mouth daily. 10/26/23   Paseda, Baird Kay, FNP  pregabalin (LYRICA) 50 MG capsule Take 1 capsule (50 mg total) by mouth 3 (three) times daily. 10/26/23   Donell Beers, FNP  tamsulosin (FLOMAX) 0.4 MG CAPS capsule Take 1 capsule (0.4 mg total) by mouth daily. 10/26/23   Donell Beers, FNP  traZODone (DESYREL) 50 MG tablet Take 1 tablet (50 mg total) by mouth at bedtime as needed for sleep. 10/26/23 04/23/24  Donell Beers, FNP      Allergies    Patient has no known allergies.  Review of Systems   Review of Systems  All other systems reviewed and are negative.   Physical Exam Updated Vital Signs BP (!) 157/86 (BP Location: Left Arm)   Pulse 65   Temp (!) 97.5 F (36.4 C) (Oral)   Resp 20   Wt 67.1 kg   SpO2 98%   BMI 24.63 kg/m  Physical Exam Vitals and nursing note reviewed.  Constitutional:      General: He is not in acute distress.    Appearance: He is well-developed.  HENT:     Head: Normocephalic and atraumatic.     Comments: Tenderness to palpation light touch along V2 aspect of cranial 5.    Mouth/Throat:     Comments: Multiple absent teeth as well as dental caries present.  No  tenderness of dentition or gum.  Where prior tooth removed, no evidence of "dry socket" or overlying tenderness.  No sublingual extremity but or swelling.  No trismus.  Uvula midline and inspection with phonation.  Tonsils 1+ bilaterally without exudate. Eyes:     Conjunctiva/sclera: Conjunctivae normal.  Cardiovascular:     Rate and Rhythm: Normal rate and regular rhythm.  Pulmonary:     Effort: Pulmonary effort is normal. No respiratory distress.     Breath sounds: Normal breath sounds.  Abdominal:     Palpations: Abdomen is soft.     Tenderness: There is no abdominal tenderness.  Musculoskeletal:        General: No swelling.     Cervical back: Neck supple.  Skin:    General: Skin is warm and dry.     Capillary Refill: Capillary refill takes less than 2 seconds.  Neurological:     Mental Status: He is alert.     Comments: Alert and oriented to self, place, time and event.   Speech is fluent, clear without dysarthria or dysphasia.   Strength 5/5 in upper/lower extremities   Sensation intact in upper/lower extremities   Normal gait.  No pronator drift.  Normal finger-to-nose and feet tapping.  CN I not tested  CN II not tested CN III, IV, VI PERRLA and EOMs intact bilaterally  CN V Intact sensation to sharp and light touch to the face  CN VII facial movements symmetric  CN VIII not tested  CN IX, X no uvula deviation, symmetric rise of soft palate  CN XI 5/5 SCM and trapezius strength bilaterally  CN XII Midline tongue protrusion, symmetric L/R movements     Psychiatric:        Mood and Affect: Mood normal.     ED Results / Procedures / Treatments   Labs (all labs ordered are listed, but only abnormal results are displayed) Labs Reviewed - No data to display  EKG None  Radiology No results found.  Procedures Procedures    Medications Ordered in ED Medications - No data to display  ED Course/ Medical Decision Making/ A&P                                  Medical Decision Making Risk Prescription drug management.   This patient presents to the ED for concern of facial pain, this involves an extensive number of treatment options, and is a complaint that carries with it a high risk of complications and morbidity.  The differential diagnosis includes for trigeminal neuralgia, cellulitis, periapical abscess, Ludwig angina, other   Co morbidities that complicate the patient  evaluation  See HPI   Additional history obtained:  Additional history obtained from EMR External records from outside source obtained and reviewed including hospital records   Lab Tests:  N/a   Imaging Studies ordered:  N/a   Cardiac Monitoring: / EKG:  The patient was maintained on a cardiac monitor.  I personally viewed and interpreted the cardiac monitored which showed an underlying rhythm of: sinus rhythm   Consultations Obtained:  N/a   Problem List / ED Course / Critical interventions / Medication management  Facial pain N/a Reevaluation of the patient showed that the patient stayed the same I have reviewed the patients home medicines and have made adjustments as needed   Social Determinants of Health:  Cigarette use.  Denies illicit drug use.   Test / Admission - Considered:  Facial pain Vitals signs significant for hypertension blood pressure 157/86. Otherwise within normal range and stable throughout visit. 73 year old male presents emergency department right-sided facial pain over the past month.  He recently had a dental procedure with teeth removed right upper around a week ago.  On exam, no gingival or gum tenderness.  No evidence of Ludwig angina, periapical abscess.  Patient was a tenderness to light touch along the V2 aspect of cranial nerve V.  Concern for possible underlying trigeminal neuralgia given distribution of patient's pain given that it was present prior to dentition was repaired and remains the same despite tooth  being removed..  Lower suspicion for periapical abscess, alveolar osteitis, Ludwig angina.  Will trial carbamazepine concurrent with Tylenol for treatment of pain and follow-up with PCP.  Treatment plan discussed at length with patient and he acknowledged understanding was agreeable to said plan.  Patient overall well-appearing, afebrile in no acute distress. Worrisome signs and symptoms were discussed with the patient, and the patient acknowledged understanding to return to the ED if noticed. Patient was stable upon discharge.          Final Clinical Impression(s) / ED Diagnoses Final diagnoses:  Facial pain    Rx / DC Orders      Peter Garter, PA 12/04/23 Lynelle Smoke    Vanetta Mulders, MD 12/04/23 2238

## 2023-12-04 NOTE — Discharge Instructions (Signed)
As discussed, we will try medication called carbamazepine given concern for nerve pain in your face.  You may continue take Tylenol with this.  Recommend following up with your primary care for reassessment of your symptoms.  Please do not hesitate to return if the worrisome signs and symptoms we discussed become apparent.

## 2023-12-04 NOTE — ED Triage Notes (Signed)
BIB family from home for R maxillary facial/ dental pain s/p extractions. C/o dry socket. Went back to dentist, but they were unable to prescribe anything for pain. Denies fever, NV. Mentions some light headedness. Endorse BP running high. Family concerned about speech. Alert, NAD, calm, interactive, resps e/u, speaking clearly. No droop, drift, or weakness noted. States, "pain and/or BP making my eyes water".

## 2023-12-10 ENCOUNTER — Other Ambulatory Visit: Payer: Self-pay | Admitting: Nurse Practitioner

## 2023-12-10 ENCOUNTER — Encounter: Payer: Self-pay | Admitting: Nurse Practitioner

## 2023-12-10 ENCOUNTER — Other Ambulatory Visit: Payer: Self-pay

## 2023-12-10 ENCOUNTER — Ambulatory Visit (INDEPENDENT_AMBULATORY_CARE_PROVIDER_SITE_OTHER): Payer: Medicare PPO | Admitting: Nurse Practitioner

## 2023-12-10 VITALS — BP 120/66 | HR 60 | Temp 98.3°F | Wt 154.0 lb

## 2023-12-10 DIAGNOSIS — N1831 Chronic kidney disease, stage 3a: Secondary | ICD-10-CM | POA: Diagnosis not present

## 2023-12-10 DIAGNOSIS — I10 Essential (primary) hypertension: Secondary | ICD-10-CM | POA: Diagnosis not present

## 2023-12-10 DIAGNOSIS — Z09 Encounter for follow-up examination after completed treatment for conditions other than malignant neoplasm: Secondary | ICD-10-CM | POA: Insufficient documentation

## 2023-12-10 DIAGNOSIS — F17200 Nicotine dependence, unspecified, uncomplicated: Secondary | ICD-10-CM | POA: Diagnosis not present

## 2023-12-10 DIAGNOSIS — R519 Headache, unspecified: Secondary | ICD-10-CM | POA: Diagnosis not present

## 2023-12-10 MED ORDER — CARBAMAZEPINE ER 100 MG PO CP12
100.0000 mg | ORAL_CAPSULE | Freq: Two times a day (BID) | ORAL | 0 refills | Status: DC | PRN
  Filled 2023-12-10: qty 20, 10d supply, fill #0

## 2023-12-10 MED ORDER — CARBAMAZEPINE ER 100 MG PO TB12
100.0000 mg | ORAL_TABLET | Freq: Two times a day (BID) | ORAL | 2 refills | Status: DC | PRN
  Filled 2023-12-10: qty 60, 30d supply, fill #0

## 2023-12-10 NOTE — Assessment & Plan Note (Addendum)
 Hospital chart reviewed, including discharge summary Medications reconciled and reviewed with the patient in detail

## 2023-12-10 NOTE — Patient Instructions (Signed)

## 2023-12-10 NOTE — Progress Notes (Signed)
Established Patient Office Visit  Subjective:  Patient ID: Daniel Haynes, male    DOB: Apr 10, 1951  Age: 73 y.o. MRN: [id number]  CC:  Chief Complaint  Patient presents with   Hospitalization Follow-up    HPI Daniel Haynes is a 73 y.o. male  has a past medical history of Arthritis, Gout, Hyperlipidemia, Hypertension, and Insomnia (08/2019).  Patient presents for follow-up for his chronic medical conditions  Hypertension.  Currently not on amlodipine mg daily, hydrochlorothiazide 25mg  daily, lisinopril 20mg  daily.  Patient denies chest pain, shortness of breath, edema  Facial pain Patient was at the emergency department on 12/04/2023 for complaints of facial pain.  Had right upper tooth removed about a month ago but continues to have pain on the right side of his face.  Describes the pain as burning, stinging pain, pain sometimes wakes him up at night when sleeping.  There was a concern for possible underlying trigeminal neuralgia given distribution of patient's pain and that it was present prior to dental procedure and pain persist despite tubes being removed, patient was started on carbamazepine 100 mg twice daily as needed which he has been taking.  Patient stated that his pain has improved since he started taking the medication    Past Medical History:  Diagnosis Date   Arthritis    hands fingers wrists    Gout    Hyperlipidemia    Hypertension    Insomnia 08/2019    Past Surgical History:  Procedure Laterality Date   CERVICAL DISCECTOMY  10/19/2002   ACDF C3 to C6 [facility]    Family History  Problem Relation Age of Onset   Cancer Mother    Cancer Father    Colon cancer Neg Hx    Colon polyps Neg Hx    Esophageal cancer Neg Hx    Rectal cancer Neg Hx    Stomach cancer Neg Hx     Social History   Socioeconomic History   Marital status: Single    Spouse name: Not on file   Number of children: Not on file   Years of education: Not on file   Highest education level: Not  on file  Occupational History   Not on file  Tobacco Use   Smoking status: Some Days    Current packs/day: 0.25    Types: Cigarettes   Smokeless tobacco: Never   Tobacco comments:    6 cigs  per week.  Vaping Use   Vaping status: Never Used  Substance and Sexual Activity   Alcohol use: Yes    Alcohol/week: 21.0 standard drinks of alcohol    Types: 21 Shots of liquor per week    Comment: 3 a day    Drug use: Yes    Types: Marijuana    Comment: occ use    Sexual activity: Not Currently  Other Topics Concern   Not on file  Social History Narrative   Lives with his son    Social Drivers of Corporate investment banker Strain: Not on file  Food Insecurity: Not on file  Transportation Needs: Not on file  Physical Activity: Not on file  Stress: Not on file  Social Connections: Not on file  Intimate Partner Violence: Not on file    Outpatient Medications Prior to Visit  Medication Sig Dispense Refill   amLODipine (NORVASC) 10 MG tablet Take 1 tablet (10 mg total) by mouth daily. 90 tablet 1   finasteride (PROSCAR) 5 MG tablet Take 1 tablet (5  mg total) by mouth daily. 30 tablet 0   hydrochlorothiazide (HYDRODIURIL) 25 MG tablet Take 1 tablet (25 mg total) by mouth daily. 90 tablet 1   lisinopril (ZESTRIL) 20 MG tablet Take 1 tablet (20 mg total) by mouth daily. 90 tablet 1   pravastatin (PRAVACHOL) 40 MG tablet Take 1 tablet (40 mg total) by mouth daily. 90 tablet 1   pregabalin (LYRICA) 50 MG capsule Take 1 capsule (50 mg total) by mouth 3 (three) times daily. 90 capsule 0   tamsulosin (FLOMAX) 0.4 MG CAPS capsule Take 1 capsule (0.4 mg total) by mouth daily. 90 capsule 1   traZODone (DESYREL) 50 MG tablet Take 1 tablet (50 mg total) by mouth at bedtime as needed for sleep. 90 tablet 1   carbamazepine (CARBATROL) 100 MG 12 hr capsule Take 1 capsule (100 mg total) by mouth 2 (two) times daily as needed (facial pain). 20 capsule 0   colchicine 0.6 MG tablet TAKE 2 TABLETS  (1.2MG ) BY MOUTH AT FIRST SIGN OF FLARE,FOLLOWED BY 1 TABLET (0.6MG ) AFTER 1 HOUR (Patient not taking: Reported on 12/10/2023) 30 tablet 5   ibuprofen (ADVIL) 800 MG tablet Take 1 tablet (800 mg total) by mouth every 8 (eight) hours as needed for pain. (Patient not taking: Reported on 12/10/2023) 20 tablet 0   oxyCODONE (ROXICODONE) 5 MG immediate release tablet Take 1 tablet (5 mg total) by mouth every 6 (six) hours as needed for severe pain (pain score 7-10). (Patient not taking: Reported on 12/10/2023) 15 tablet 0   No facility-administered medications prior to visit.    No Known Allergies  ROS Review of Systems  Constitutional:  Negative for appetite change, chills, fatigue and fever.  HENT:  Positive for dental problem. Negative for congestion, postnasal drip, rhinorrhea and sneezing.        Facial pain  Respiratory:  Negative for cough, shortness of breath and wheezing.   Cardiovascular:  Negative for chest pain, palpitations and leg swelling.  Gastrointestinal:  Negative for abdominal pain, constipation, nausea and vomiting.  Genitourinary:  Negative for difficulty urinating, dysuria, flank pain and frequency.  Musculoskeletal:  Negative for arthralgias, back pain, joint swelling and myalgias.  Skin:  Negative for color change, pallor, rash and wound.  Neurological:  Negative for dizziness, facial asymmetry, weakness, numbness and headaches.  Psychiatric/Behavioral:  Negative for behavioral problems, confusion, self-injury and suicidal ideas.       Objective:    Physical Exam Vitals and nursing note reviewed.  Constitutional:      General: He is not in acute distress.    Appearance: Normal appearance. He is not ill-appearing, toxic-appearing or diaphoretic.  HENT:     Mouth/Throat:     Mouth: Mucous membranes are moist.     Dentition: Dental caries present.     Tongue: No lesions. Tongue does not deviate from midline.     Pharynx: Oropharynx is clear. No oropharyngeal exudate  or posterior oropharyngeal erythema.     Comments: Missing teeth. No redness or swelling noted Eyes:     General: No scleral icterus.       Right eye: No discharge.        Left eye: No discharge.     Extraocular Movements: Extraocular movements intact.     Conjunctiva/sclera: Conjunctivae normal.  Cardiovascular:     Rate and Rhythm: Normal rate and regular rhythm.     Pulses: Normal pulses.     Heart sounds: Normal heart sounds. No murmur heard.  No friction rub. No gallop.  Pulmonary:     Effort: Pulmonary effort is normal. No respiratory distress.     Breath sounds: Normal breath sounds. No stridor. No wheezing, rhonchi or rales.  Chest:     Chest wall: No tenderness.  Abdominal:     General: There is no distension.     Palpations: Abdomen is soft.     Tenderness: There is no abdominal tenderness. There is no right CVA tenderness, left CVA tenderness or guarding.  Musculoskeletal:        General: No swelling, tenderness, deformity or signs of injury.     Right lower leg: No edema.     Left lower leg: No edema.  Skin:    General: Skin is warm and dry.     Capillary Refill: Capillary refill takes less than 2 seconds.     Coloration: Skin is not jaundiced or pale.     Findings: No bruising, erythema or lesion.  Neurological:     Mental Status: He is alert and oriented to person, place, and time.     Motor: No weakness.     Coordination: Coordination normal.     Gait: Gait normal.  Psychiatric:        Mood and Affect: Mood normal.        Behavior: Behavior normal.        Thought Content: Thought content normal.        Judgment: Judgment normal.     BP 120/66   Pulse 60   Temp 98.3 F (36.8 C)   Wt 154 lb (69.9 kg)   SpO2 100%   BMI 25.63 kg/m  Wt Readings from Last 3 Encounters:  12/10/23 154 lb (69.9 kg)  12/04/23 148 lb (67.1 kg)  11/12/23 148 lb (67.1 kg)    Lab Results  Component Value Date   TSH 0.744 08/25/2019   Lab Results  Component Value Date    WBC 5.6 10/26/2023   HGB 14.6 10/26/2023   HCT 44.3 10/26/2023   MCV 94 10/26/2023   PLT 310 10/26/2023   Lab Results  Component Value Date   NA 143 10/26/2023   K 4.6 10/26/2023   CO2 26 10/26/2023   GLUCOSE 90 10/26/2023   BUN 22 10/26/2023   CREATININE 1.36 (H) 10/26/2023   BILITOT 0.3 10/26/2023   ALKPHOS 111 10/26/2023   AST 34 10/26/2023   ALT 36 10/26/2023   PROT 7.0 10/26/2023   ALBUMIN 4.4 10/26/2023   CALCIUM 9.2 10/26/2023   ANIONGAP 13 12/30/2015   EGFR 55 (L) 10/26/2023   Lab Results  Component Value Date   CHOL 197 03/11/2020   Lab Results  Component Value Date   HDL 54 03/11/2020   Lab Results  Component Value Date   LDLCALC 116 (H) 03/11/2020   Lab Results  Component Value Date   TRIG 155 (H) 03/11/2020   Lab Results  Component Value Date   CHOLHDL 3.6 03/11/2020   Lab Results  Component Value Date   HGBA1C 5.1 08/25/2019      Assessment & Plan:   Problem List Items Addressed This Visit       Cardiovascular and Mediastinum   Essential hypertension   BP Readings from Last 3 Encounters:  12/10/23 120/66  12/04/23 (!) 157/86  11/12/23 (!) 172/89   HTN Controlled .  On amlodipine 10 mg daily, hydrochlorothiazide 25 mg daily, lisinopril 20 mg daily Continue current medications. No changes in management. Discussed DASH diet and  dietary sodium restrictions Continue to increase dietary efforts and exercise.         Relevant Orders   Basic Metabolic Panel     Genitourinary   Chronic kidney disease (CKD) stage G3a/A1, moderately decreased glomerular filtration rate (GFR) between 45-59 mL/min/1.73 square meter and albuminuria creatinine ratio less than 30 mg/g (HCC)   Lab Results  Component Value Date   NA 143 10/26/2023   K 4.6 10/26/2023   CO2 26 10/26/2023   GLUCOSE 90 10/26/2023   BUN 22 10/26/2023   CREATININE 1.36 (H) 10/26/2023   CALCIUM 9.2 10/26/2023   EGFR 55 (L) 10/26/2023   GFRNONAA 49 (L) 09/02/2020  Avoid  NSAIDs and other nephrotoxic agents Recheck  labs today         Other   Facial pain - Primary   Continue carbamazepine 100 mg twice daily as needed, changed to tablet form , pharmacy states that the capsule form is more expensive Encouraged to take Tylenol as needed, avoid use of NSAIDs due to CKD      Tobacco use disorder   Cigarettes on some days, not ready to quit but cutting back Need to quit smoking discussed      Encounter for examination following treatment at hospital   Hospital chart reviewed, including discharge summary Medications reconciled and reviewed with the patient in detail        Meds ordered this encounter  Medications   DISCONTD: carbamazepine (CARBATROL) 100 MG 12 hr capsule    Sig: Take 1 capsule (100 mg total) by mouth 2 (two) times daily as needed (facial pain).    Dispense:  20 capsule    Refill:  0    Follow-up: Return in about 4 months (around 04/08/2024) for CPE.    Donell Beers, FNP

## 2023-12-10 NOTE — Assessment & Plan Note (Signed)
BP Readings from Last 3 Encounters:  12/10/23 120/66  12/04/23 (!) 157/86  11/12/23 (!) 172/89   HTN Controlled .  On amlodipine 10 mg daily, hydrochlorothiazide 25 mg daily, lisinopril 20 mg daily Continue current medications. No changes in management. Discussed DASH diet and dietary sodium restrictions Continue to increase dietary efforts and exercise.

## 2023-12-10 NOTE — Assessment & Plan Note (Signed)
Cigarettes on some days, not ready to quit but cutting back Need to quit smoking discussed

## 2023-12-10 NOTE — Assessment & Plan Note (Signed)
Lab Results  Component Value Date   NA 143 10/26/2023   K 4.6 10/26/2023   CO2 26 10/26/2023   GLUCOSE 90 10/26/2023   BUN 22 10/26/2023   CREATININE 1.36 (H) 10/26/2023   CALCIUM 9.2 10/26/2023   EGFR 55 (L) 10/26/2023   GFRNONAA 49 (L) 09/02/2020  Avoid NSAIDs and other nephrotoxic agents Recheck  labs today

## 2023-12-10 NOTE — Assessment & Plan Note (Addendum)
Continue carbamazepine 100 mg twice daily as needed, changed to tablet form , pharmacy states that the capsule form is more expensive Encouraged to take Tylenol as needed, avoid use of NSAIDs due to CKD

## 2023-12-11 ENCOUNTER — Other Ambulatory Visit: Payer: Self-pay

## 2023-12-11 LAB — BASIC METABOLIC PANEL
BUN/Creatinine Ratio: 18 (ref 10–24)
BUN: 24 mg/dL (ref 8–27)
CO2: 24 mmol/L (ref 20–29)
Calcium: 9.3 mg/dL (ref 8.6–10.2)
Chloride: 104 mmol/L (ref 96–106)
Creatinine, Ser: 1.32 mg/dL — ABNORMAL HIGH (ref 0.76–1.27)
Glucose: 103 mg/dL — ABNORMAL HIGH (ref 70–99)
Potassium: 4.1 mmol/L (ref 3.5–5.2)
Sodium: 145 mmol/L — ABNORMAL HIGH (ref 134–144)
eGFR: 57 mL/min/{1.73_m2} — ABNORMAL LOW (ref >59)

## 2023-12-13 ENCOUNTER — Other Ambulatory Visit: Payer: Self-pay

## 2023-12-13 ENCOUNTER — Other Ambulatory Visit: Payer: Self-pay | Admitting: Nurse Practitioner

## 2023-12-13 DIAGNOSIS — R519 Headache, unspecified: Secondary | ICD-10-CM

## 2023-12-13 MED ORDER — CARBAMAZEPINE ER 100 MG PO TB12
100.0000 mg | ORAL_TABLET | Freq: Two times a day (BID) | ORAL | 2 refills | Status: DC
  Filled 2023-12-13 – 2023-12-15 (×2): qty 60, 30d supply, fill #0
  Filled 2024-01-04: qty 60, 30d supply, fill #1

## 2023-12-14 ENCOUNTER — Other Ambulatory Visit: Payer: Self-pay

## 2023-12-14 ENCOUNTER — Telehealth: Payer: Self-pay

## 2023-12-14 NOTE — Telephone Encounter (Unsigned)
 Copied from CRM [id number]. Topic: Clinical - Prescription Issue >> Dec 14, 2023  4:02 PM Shelah Lewandowsky wrote: Reason for CRM: carbamazepine (TEGRETOL-XR) 100 MG 12 hr tablet- still waiting for refill to be sent to pharmacy- patient number [phone]

## 2023-12-15 ENCOUNTER — Other Ambulatory Visit: Payer: Self-pay | Admitting: Nurse Practitioner

## 2023-12-15 ENCOUNTER — Other Ambulatory Visit: Payer: Self-pay

## 2023-12-15 MED ORDER — FINASTERIDE 5 MG PO TABS
5.0000 mg | ORAL_TABLET | Freq: Every day | ORAL | 1 refills | Status: DC
  Filled 2023-12-15: qty 90, 90d supply, fill #0

## 2023-12-16 ENCOUNTER — Other Ambulatory Visit: Payer: Self-pay

## 2023-12-20 ENCOUNTER — Other Ambulatory Visit: Payer: Self-pay

## 2023-12-20 ENCOUNTER — Other Ambulatory Visit: Payer: Self-pay | Admitting: Nurse Practitioner

## 2023-12-20 ENCOUNTER — Ambulatory Visit: Payer: Self-pay | Admitting: Nurse Practitioner

## 2023-12-20 DIAGNOSIS — R519 Headache, unspecified: Secondary | ICD-10-CM

## 2023-12-20 DIAGNOSIS — H9311 Tinnitus, right ear: Secondary | ICD-10-CM

## 2023-12-20 NOTE — Telephone Encounter (Signed)
   Chief Complaint: jaw pain Symptoms: pain and ringing in right ear Frequency: comes and goes   Disposition: [] ED /[] Urgent Care (no appt availability in office) / [x] Appointment(In office/virtual)/ []  [facility]/ [] Home Care/ [] Refused Recommended Disposition /[] [facility]/ []  Follow-up with PCP Additional Notes: Pt calling with jaw pain and ringing in right ear. This is stemming from a tooth pulled several months ago.  Pt has been seen multiple times for this and refused appt today.  Pt states the "nerve pill" isn't helping. Pt is losing sleep at night. Pt rated pain 9/10. Pt is asking PCP to call another medication in so pt can get some relief. Pt had to get phone quickly due to job. Please advise and let pt know if you can call in another medication. 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            Copied from CRM [id number]. Topic: Clinical - Red Word Triage >> Dec 20, 2023 12:35 PM Geroge Baseman wrote: Red Word that prompted transfer to Nurse Triage: Severe pain from top of jaw bone down and causing severe ear pain as well. Patient has been on nerve meds but states they are not helping at all. Cannot get any sleep due to amount of pain Reason for Disposition  [1] SEVERE mouth pain (e.g., excruciating) AND [2] not improved after 2 hours of pain medicine  Answer Assessment - Initial Assessment Questions 1. ONSET: "When did the mouth start hurting?" (e.g., hours or days ago)      Couple of months  2. SEVERITY: "How bad is the pain?" (Scale 1-10; mild, moderate or severe)   - MILD (1-3):  doesn't interfere with eating or normal activities   - MODERATE (4-7): interferes with eating some solids and normal activities   - SEVERE (8-10):  excruciating pain, interferes with most normal activities   - SEVERE DYSPHAGIA: can't swallow liquids, drooling     9 3. SORES: "Are there any sores or ulcers in the mouth?" If Yes, ask: "What part of the mouth are the sores  in?"     no 4. FEVER: "Do you have a fever?" If Yes, ask: "What is your temperature, how was it measured, and when did it start?"     na 5. CAUSE: "What do you think is causing the mouth pain?"     Nerve pain  6. OTHER SYMPTOMS: "Do you have any other symptoms?" (e.g., difficulty breathing)     Ringing in right ear  Protocols used: Mouth Pain-A-AH

## 2023-12-21 ENCOUNTER — Other Ambulatory Visit: Payer: Self-pay

## 2023-12-21 ENCOUNTER — Encounter (INDEPENDENT_AMBULATORY_CARE_PROVIDER_SITE_OTHER): Payer: Self-pay | Admitting: Otolaryngology

## 2023-12-22 ENCOUNTER — Other Ambulatory Visit: Payer: Self-pay

## 2024-01-04 ENCOUNTER — Other Ambulatory Visit: Payer: Self-pay

## 2024-01-05 ENCOUNTER — Other Ambulatory Visit: Payer: Self-pay

## 2024-01-07 ENCOUNTER — Encounter: Payer: Self-pay | Admitting: Family Medicine

## 2024-01-07 ENCOUNTER — Ambulatory Visit: Admitting: Family Medicine

## 2024-01-07 ENCOUNTER — Ambulatory Visit: Payer: Self-pay | Admitting: Nurse Practitioner

## 2024-01-07 VITALS — BP 180/100 | HR 63 | Temp 97.9°F | Resp 16 | Ht 64.0 in | Wt 152.5 lb

## 2024-01-07 DIAGNOSIS — R202 Paresthesia of skin: Secondary | ICD-10-CM

## 2024-01-07 DIAGNOSIS — M792 Neuralgia and neuritis, unspecified: Secondary | ICD-10-CM

## 2024-01-07 DIAGNOSIS — R220 Localized swelling, mass and lump, head: Secondary | ICD-10-CM | POA: Diagnosis not present

## 2024-01-07 DIAGNOSIS — R519 Headache, unspecified: Secondary | ICD-10-CM

## 2024-01-07 DIAGNOSIS — H9311 Tinnitus, right ear: Secondary | ICD-10-CM

## 2024-01-07 MED ORDER — PREDNISONE 20 MG PO TABS: 40.0000 mg | ORAL_TABLET | Freq: Every day | ORAL | 0 refills | Status: AC

## 2024-01-07 MED ORDER — AMOXICILLIN-POT CLAVULANATE 875-125 MG PO TABS: 1.0000 | ORAL_TABLET | Freq: Two times a day (BID) | ORAL | 0 refills | Status: DC

## 2024-01-07 NOTE — Telephone Encounter (Signed)
 Chief Complaint: Jaw pain Symptoms: dizziness, insomnia,  Frequency: Ongoing about 1 month Pertinent Negatives: Patient denies N/V Disposition: [] ED /[] Urgent Care (no appt availability in office) / [x] Appointment(In office/virtual)/ []  [facility]/ [] Home Care/ [] Refused Recommended Disposition /[] [facility]/ []  Follow-up with PCP Additional Notes: Pt reports he has been experiencing  intense episodes of jaw pain for about 1 month. He notes it is to the right jaw/face. Notes when episodes occur they last approx 6 seconds and resolve. They come with intense pain, spasm of the muscle and he notes feeling he can't hear as well during. OV scheduled today. Pt has been seen in ED for this prior. This RN educated pt on home care, new-worsening symptoms, when to call back/seek emergent care. Pt verbalized understanding and agrees to plan.   Reason for Disposition  [1] SEVERE pain (e.g., excruciating) AND [2] not improved after 2 hours of pain medicine  Answer Assessment - Initial Assessment Questions 1. ONSET: "When did the pain start?" (e.g., minutes, hours, days)     About 1 month ago 2. ONSET: "Does the pain come and go, or has it been constant since it started?" (e.g., constant, intermittent, fleeting)     Constant 3. SEVERITY: "How bad is the pain?"   (Scale 1-10; mild, moderate or severe)   - MILD (1-3): doesn't interfere with normal activities    - MODERATE (4-7): interferes with normal activities or awakens from sleep    - SEVERE (8-10): excruciating pain, unable to do any normal activities      8/10 4. LOCATION: "Where does it hurt?"      Right 5. RASH: "Is there any redness, rash, or swelling of the face?"     Occasional swelling 6. FEVER: "Do you have a fever?" If Yes, ask: "What is it, how was it measured, and when did it start?"      Unknown 7. OTHER SYMPTOMS: "Do you have any other symptoms?" (e.g., fever, toothache, nasal discharge, nasal congestion,  clicking sensation in jaw joint)     Dizziness, off balance  Protocols used: Face Pain-A-AH

## 2024-01-07 NOTE — Progress Notes (Signed)
 Patient ID: Daniel Haynes, male    DOB: 08-27-1951, 73 y.o.   MRN: [id number]  PCP: Donell Beers, FNP  Chief Complaint  Patient presents with   Jaw Pain    Symptoms: dizziness, insomnia,  Frequency: Ongoing about 1 month Patient was seen for face pain 12/04/2023 at [facility] ED    Subjective:   Daniel Haynes is a 73 y.o. male, presents to clinic with CC of the following:  HPI  Pt presents with months of Tooth and cheek/face pain onset in January and he's gone to the ED and dentist pulled a tooth and the sx were no better.  He has also seen PCP, treated for facial pain and trigeminal neuralgia with carbamenzapine - no improvement MRI and labs ordered, mri not completed - he was not aware of it He states facial movement or touch to his face causes severe shocks of pain and swelling w/in a few seconds  BP very high today - he reports current 8/10 pain    Patient Active Problem List   Diagnosis Date Noted   Facial pain 12/10/2023   Tobacco use disorder 12/10/2023   Encounter for examination following treatment at hospital 12/10/2023   Pain, dental 10/26/2023   Pain in both hands 10/26/2023   Insomnia due to medical condition 10/26/2023   Benign prostatic hyperplasia with urinary frequency 10/26/2023   Hyperlipidemia 10/26/2023   Essential hypertension 10/26/2023   Need for influenza vaccination 10/26/2023   Paresthesia 06/08/2022   Chronic kidney disease (CKD) stage G3a/A1, moderately decreased glomerular filtration rate (GFR) between 45-59 mL/min/1.73 square meter and albuminuria creatinine ratio less than 30 mg/g (HCC) 06/06/2020   Urinary frequency 07/31/2017   Accelerated hypertension 02/17/2016   Frequency of urination 02/17/2016   Gout 02/17/2016      Current Outpatient Medications:    amLODipine (NORVASC) 10 MG tablet, Take 1 tablet (10 mg total) by mouth daily., Disp: 90 tablet, Rfl: 1   carbamazepine (TEGRETOL-XR) 100 MG 12 hr tablet, Take 1 tablet (100  mg total) by mouth 2 (two) times daily., Disp: 60 tablet, Rfl: 2   colchicine 0.6 MG tablet, TAKE 2 TABLETS (1.2MG ) BY MOUTH AT FIRST SIGN OF FLARE,FOLLOWED BY 1 TABLET (0.6MG ) AFTER 1 HOUR (Patient not taking: Reported on 12/10/2023), Disp: 30 tablet, Rfl: 5   finasteride (PROSCAR) 5 MG tablet, Take 1 tablet (5 mg total) by mouth daily., Disp: 90 tablet, Rfl: 1   hydrochlorothiazide (HYDRODIURIL) 25 MG tablet, Take 1 tablet (25 mg total) by mouth daily., Disp: 90 tablet, Rfl: 1   ibuprofen (ADVIL) 800 MG tablet, Take 1 tablet (800 mg total) by mouth every 8 (eight) hours as needed for pain. (Patient not taking: Reported on 12/10/2023), Disp: 20 tablet, Rfl: 0   lisinopril (ZESTRIL) 20 MG tablet, Take 1 tablet (20 mg total) by mouth daily., Disp: 90 tablet, Rfl: 1   oxyCODONE (ROXICODONE) 5 MG immediate release tablet, Take 1 tablet (5 mg total) by mouth every 6 (six) hours as needed for severe pain (pain score 7-10). (Patient not taking: Reported on 12/10/2023), Disp: 15 tablet, Rfl: 0   pravastatin (PRAVACHOL) 40 MG tablet, Take 1 tablet (40 mg total) by mouth daily., Disp: 90 tablet, Rfl: 1   pregabalin (LYRICA) 50 MG capsule, Take 1 capsule (50 mg total) by mouth 3 (three) times daily., Disp: 90 capsule, Rfl: 0   tamsulosin (FLOMAX) 0.4 MG CAPS capsule, Take 1 capsule (0.4 mg total) by mouth daily., Disp: 90 capsule, Rfl:  1   traZODone (DESYREL) 50 MG tablet, Take 1 tablet (50 mg total) by mouth at bedtime as needed for sleep., Disp: 90 tablet, Rfl: 1   No Known Allergies   Social History   Tobacco Use   Smoking status: Some Days    Current packs/day: 0.25    Types: Cigarettes   Smokeless tobacco: Never   Tobacco comments:    6 cigs  per week.  Vaping Use   Vaping status: Never Used  Substance Use Topics   Alcohol use: Yes    Alcohol/week: 21.0 standard drinks of alcohol    Types: 21 Shots of liquor per week    Comment: 3 a day    Drug use: Yes    Types: Marijuana    Comment: occ  use       Chart Review Today: I personally reviewed active problem list, medication list, allergies, family history, social history, health maintenance, notes from last encounter, lab results, imaging with the patient/caregiver today.   Review of Systems  Constitutional: Negative.  Negative for activity change, appetite change, chills, diaphoresis, fatigue and fever.  HENT: Negative.    Eyes: Negative.   Respiratory: Negative.    Cardiovascular: Negative.   Gastrointestinal: Negative.   Endocrine: Negative.   Genitourinary: Negative.   Musculoskeletal: Negative.   Skin: Negative.   Allergic/Immunologic: Negative.   Neurological: Negative.   Hematological: Negative.   Psychiatric/Behavioral: Negative.    All other systems reviewed and are negative.      Objective:   Vitals:   01/07/24 1347  BP: (!) 180/100  Pulse: 63  Resp: 16  Temp: 97.9 F (36.6 C)  TempSrc: Oral  SpO2: 95%  Weight: 152 lb 8 oz (69.2 kg)  Height: 5\' 4"  (1.626 m)    Body mass index is 26.18 kg/m.  Physical Exam Vitals reviewed.  Constitutional:      General: He is not in acute distress.    Appearance: Normal appearance. He is not ill-appearing, toxic-appearing or diaphoretic.  HENT:     Head: Normocephalic and atraumatic.     Nose:     Right Sinus: Maxillary sinus tenderness present.     Comments: Generalized ttp to right side of face temple to max sinus, no swelling visualized or palpated     Mouth/Throat:     Mouth: Mucous membranes are moist.     Dentition: Abnormal dentition. Dental tenderness and dental caries present.     Pharynx: Oropharynx is clear. Uvula midline. No pharyngeal swelling or posterior oropharyngeal erythema.     Tonsils: No tonsillar exudate.     Comments: Multiple missing teeth, vast dental decay Cardiovascular:     Rate and Rhythm: Normal rate.  Pulmonary:     Effort: Pulmonary effort is normal. No respiratory distress.  Neurological:     Mental Status: He is  alert.     Cranial Nerves: No dysarthria or facial asymmetry.     Sensory: Sensation is intact.     Gait: Gait is intact. Gait normal.      Results for orders placed or performed in visit on 12/10/23  Basic Metabolic Panel   Collection Time: 12/10/23  2:48 PM  Result Value Ref Range   Glucose 103 (H) 70 - 99 mg/dL   BUN 24 8 - 27 mg/dL   Creatinine, Ser 1.61 (H) 0.76 - 1.27 mg/dL   eGFR 57 (L) >09 UE/AVW/0.98   BUN/Creatinine Ratio 18 10 - 24   Sodium 145 (H) 134 - 144  mmol/L   Potassium 4.1 3.5 - 5.2 mmol/L   Chloride 104 96 - 106 mmol/L   CO2 24 20 - 29 mmol/L   Calcium 9.3 8.6 - 10.2 mg/dL       Assessment & Plan:    Pt here for acute visit, new to me, CC has actually been occurring for months and he has been seen in ED, by dental clinic and PCP for same He states tooth pulled and no improvement Also tried meds from PCP w/o improvement Pain is primarily to right side of face and cheek, worse with any facial movements or palpation He endorses sudden swelling associated with pain, however no swelling observed during exam. I reviewed past labs imaging and plan with PCP but he is having difficulty telling me if he is aware of who his pcp even is.  He could not tell me when he had right upper molars pulled or clear timeline for his sx, but he is clear in stating that pain is severe and nothing has helped.  It sounds like he has tried left over abx? Will cover with abx and steroids, get some labs and f/up on imaging. I explained that this OV and meds I prescribe are unlikely to figure out a dx and treat him 100% so that he is no longer in pain, I explained that this sounds more complicated and labs, imaging, specialists and a process of medications will likely be needed to help improve his sx, and his pain may never go 100% away.  Right now I am not sure what is causing his pain - looks like PCP was working up and tx him for trigeminal neuralgia     ICD-10-CM   1. Facial pain   R51.9 CBC with Differential/Platelet    C-reactive protein    Sedimentation rate    predniSONE (DELTASONE) 20 MG tablet    amoxicillin-clavulanate (AUGMENTIN) 875-125 MG tablet    MR Brain W Wo Contrast    gabapentin (NEURONTIN) 300 MG capsule    Ambulatory referral to Neurology    2. Tinnitus of right ear  H93.11 predniSONE (DELTASONE) 20 MG tablet    Ambulatory referral to Neurology    3. New onset of headaches  R51.9 MR Brain W Wo Contrast    Ambulatory referral to Neurology    4. Paresthesia  R20.2 Ambulatory referral to Neurology    5. Neuropathic pain  M79.2 gabapentin (NEURONTIN) 300 MG capsule    Ambulatory referral to Neurology        [phone] [facility])   Alternate Contact Person   LAWRANCE, WIEDEMANN (Sister) [phone] [facility])     Results need to be called to pt - not sent on mychart  Pt will need f/up appt in ~2 weeks  Danelle Berry, PA-C 01/07/24 1:14 PM

## 2024-01-07 NOTE — Telephone Encounter (Signed)
 This RN received patient as a warm transfer. Patient has been experiencing jaw pain for about a month. Call dropped at start of triage. Per chart, provider has referred him to ENT and put in an order for an MRI. Patient was unaware of this. This RN attempted to call patient back. No answer, unable to leave a message. Routing for additional attempts.   Copied from CRM [id number]. Topic: Clinical - Red Word Triage >> Jan 07, 2024  9:03 AM Shon Hale wrote: Red Word that prompted transfer to Nurse Triage: Jaw Pain

## 2024-01-08 LAB — CBC WITH DIFFERENTIAL/PLATELET
Absolute Lymphocytes: 1588 {cells}/uL (ref 850–3900)
Absolute Monocytes: 800 {cells}/uL (ref 200–950)
Basophils Absolute: 19 {cells}/uL (ref 0–200)
Basophils Relative: 0.3 %
Eosinophils Absolute: 170 {cells}/uL (ref 15–500)
Eosinophils Relative: 2.7 %
HCT: 35.3 % — ABNORMAL LOW (ref 38.5–50.0)
Hemoglobin: 12.3 g/dL — ABNORMAL LOW (ref 13.2–17.1)
MCH: 30.8 pg (ref 27.0–33.0)
MCHC: 34.8 g/dL (ref 32.0–36.0)
MCV: 88.5 fL (ref 80.0–100.0)
MPV: 9.8 fL (ref 7.5–12.5)
Monocytes Relative: 12.7 %
Neutro Abs: 3723 {cells}/uL (ref 1500–7800)
Neutrophils Relative %: 59.1 %
Platelets: 318 10*3/uL (ref 140–400)
RBC: 3.99 10*6/uL — ABNORMAL LOW (ref 4.20–5.80)
RDW: 12.5 % (ref 11.0–15.0)
Total Lymphocyte: 25.2 %
WBC: 6.3 10*3/uL (ref 3.8–10.8)

## 2024-01-08 LAB — SEDIMENTATION RATE: Sed Rate: 28 mm/h — ABNORMAL HIGH (ref 0–20)

## 2024-01-08 LAB — C-REACTIVE PROTEIN: CRP: 17.5 mg/L — ABNORMAL HIGH (ref <8.0)

## 2024-01-10 ENCOUNTER — Encounter: Payer: Self-pay | Admitting: Family Medicine

## 2024-01-10 ENCOUNTER — Ambulatory Visit: Payer: Self-pay | Admitting: Nurse Practitioner

## 2024-01-10 MED ORDER — GABAPENTIN 300 MG PO CAPS: 300.0000 mg | ORAL_CAPSULE | Freq: Three times a day (TID) | ORAL | 0 refills | Status: DC

## 2024-01-11 ENCOUNTER — Encounter (INDEPENDENT_AMBULATORY_CARE_PROVIDER_SITE_OTHER): Payer: Self-pay

## 2024-01-11 ENCOUNTER — Other Ambulatory Visit: Payer: Self-pay

## 2024-01-12 ENCOUNTER — Other Ambulatory Visit: Payer: Self-pay

## 2024-01-14 ENCOUNTER — Ambulatory Visit: Payer: Self-pay | Admitting: Nurse Practitioner

## 2024-01-16 ENCOUNTER — Other Ambulatory Visit: Payer: Self-pay | Admitting: Family Medicine

## 2024-01-16 DIAGNOSIS — M792 Neuralgia and neuritis, unspecified: Secondary | ICD-10-CM

## 2024-01-16 DIAGNOSIS — R519 Headache, unspecified: Secondary | ICD-10-CM

## 2024-01-20 ENCOUNTER — Other Ambulatory Visit: Payer: Self-pay

## 2024-01-21 ENCOUNTER — Other Ambulatory Visit: Payer: Self-pay

## 2024-01-27 ENCOUNTER — Telehealth: Payer: Self-pay

## 2024-01-27 NOTE — Telephone Encounter (Signed)
 Lvm for pt to call back.

## 2024-01-27 NOTE — Telephone Encounter (Signed)
 Copied from CRM [id number]. Topic: Clinical - Request for Lab/Test Order >> Jan 27, 2024 10:23 AM Desma Mcgregor wrote: Reason for CRM: Dois Davenport asking for an update on the MRI BRAIN W Wo Contrast from 01/10/24. She is not sure if the pt was contacted or not as as she informed this was to be done before the appt tomorrow with Dr. Angelica Chessman. Please follow up.

## 2024-01-27 NOTE — Telephone Encounter (Signed)
 So FYI they are bad about scheduling I always give the patient the phone # every time provider orders any type of scan for them to call and schedule date and time that works best for them. He probably needs to be rescheduled if appt was to review scan

## 2024-01-28 ENCOUNTER — Encounter: Payer: Self-pay | Admitting: Neurology

## 2024-01-28 ENCOUNTER — Ambulatory Visit: Admitting: Family Medicine

## 2024-01-28 ENCOUNTER — Ambulatory Visit (INDEPENDENT_AMBULATORY_CARE_PROVIDER_SITE_OTHER): Admitting: Family Medicine

## 2024-01-28 VITALS — BP 134/72 | HR 74 | Resp 16 | Ht 64.0 in | Wt 152.0 lb

## 2024-01-28 DIAGNOSIS — R519 Headache, unspecified: Secondary | ICD-10-CM

## 2024-01-28 DIAGNOSIS — M792 Neuralgia and neuritis, unspecified: Secondary | ICD-10-CM | POA: Diagnosis not present

## 2024-01-28 DIAGNOSIS — H04209 Unspecified epiphora, unspecified lacrimal gland: Secondary | ICD-10-CM

## 2024-01-28 DIAGNOSIS — R42 Dizziness and giddiness: Secondary | ICD-10-CM | POA: Diagnosis not present

## 2024-01-28 MED ORDER — GABAPENTIN 300 MG PO CAPS: 300.0000 mg | ORAL_CAPSULE | Freq: Three times a day (TID) | ORAL | 0 refills | Status: DC

## 2024-01-28 NOTE — Patient Instructions (Signed)
 Call central scheduling [phone] To follow up on the MRI   See ENT referral info and neurology info printed out for you.

## 2024-01-28 NOTE — Progress Notes (Signed)
 Patient ID: Daniel Haynes, male    DOB: October 12, 1951, 73 y.o.   MRN: [id number]  PCP: Paseda, Folashade R, FNP  Chief Complaint  Patient presents with   Facial Pain    Subjective:   Daniel Haynes is a 74 y.o. male, presents to clinic with CC of the following:  HPI  Appt not scheduled correctly and pt is not a current pt Discussed MRI imaging, referrals and new pt appt that would need to be scheduled for him to establish care here - 40 min appt Unfortunately today was not scheduled correctly and his f/up and transfer care cannot be done  Never got gabapentin from the pharmacy He did not have any improvement with antibiotics and steroids but he does state he got them and completed them  His MRI was not scheduled or completed and he has not heard from ENT or neurology, still having trouble with accessing his chart or connecting with phone numbers in the chart  His symptoms do seem to be getting a little bit worse he is having a lot of muscle spasms and twitching in the right side of his face his right eye is tearing and leaking often he continues to explain that with any movement or palpation of his face if it swells up however his sister is here and observing him when he explains that there is increased pain and swelling there is no visible edema or change  Reviewed his labs with him again and possible diagnoses CRP and sed rate were elevated, suggestive of inflammation, hopeful that steroids would have helped some of his symptoms  Elissa Guise, CMA 01/10/2024  3:00 PM EDT Back to Top    Called and lvm w/results   Adeline Hone, PA-C 01/10/2024  1:07 PM EDT     Please call #, they do not have access to mychart   labs show signs of inflammation (Sed rate and CRP were positive) and hopefully the steroid will help with that. I sent in a different nerve pain medication for him to try, start with two doses a day for a few days then increase to TID (He should not be taking carbamenzapine or  lyrica when starting new med) I reordered the MRI (the prior PCP ordered).  It has to be approved and scheduled - please give central scheduling info to f/up on that.   I also referred him urgently to neurology - it will likely take a specialists to diagnose and tx his sx - highly recommend see the neurologist   There was no increased white blood cell count, no improvement with antibiotics and steroids likely not infectious He has not had facial droop, no history of stroke, they are anxious to get some imaging done, worsening of symptoms has been very slow gradual no acute sudden or severe changes   Patient Active Problem List   Diagnosis Date Noted   Facial pain 12/10/2023   Tobacco use disorder 12/10/2023   Encounter for examination following treatment at hospital 12/10/2023   Pain, dental 10/26/2023   Pain in both hands 10/26/2023   Insomnia due to medical condition 10/26/2023   Benign prostatic hyperplasia with urinary frequency 10/26/2023   Hyperlipidemia 10/26/2023   Essential hypertension 10/26/2023   Need for influenza vaccination 10/26/2023   Paresthesia 06/08/2022   Chronic kidney disease (CKD) stage G3a/A1, moderately decreased glomerular filtration rate (GFR) between 45-59 mL/min/1.73 square meter and albuminuria creatinine ratio less than 30 mg/g (HCC) 06/06/2020   Urinary frequency 07/31/2017  Accelerated hypertension 02/17/2016   Frequency of urination 02/17/2016   Gout 02/17/2016      Current Outpatient Medications:    amLODipine (NORVASC) 10 MG tablet, Take 1 tablet (10 mg total) by mouth daily., Disp: 90 tablet, Rfl: 1   amoxicillin-clavulanate (AUGMENTIN) 875-125 MG tablet, Take 1 tablet by mouth 2 (two) times daily., Disp: 20 tablet, Rfl: 0   colchicine 0.6 MG tablet, TAKE 2 TABLETS (1.2MG ) BY MOUTH AT FIRST SIGN OF FLARE,FOLLOWED BY 1 TABLET (0.6MG ) AFTER 1 HOUR, Disp: 30 tablet, Rfl: 5   finasteride (PROSCAR) 5 MG tablet, Take 1 tablet (5 mg total) by mouth  daily., Disp: 90 tablet, Rfl: 1   gabapentin (NEURONTIN) 300 MG capsule, Take 1 capsule (300 mg total) by mouth 3 (three) times daily., Disp: 90 capsule, Rfl: 0   hydrochlorothiazide (HYDRODIURIL) 25 MG tablet, Take 1 tablet (25 mg total) by mouth daily., Disp: 90 tablet, Rfl: 1   lisinopril (ZESTRIL) 20 MG tablet, Take 1 tablet (20 mg total) by mouth daily., Disp: 90 tablet, Rfl: 1   pravastatin (PRAVACHOL) 40 MG tablet, Take 1 tablet (40 mg total) by mouth daily., Disp: 90 tablet, Rfl: 1   tamsulosin (FLOMAX) 0.4 MG CAPS capsule, Take 1 capsule (0.4 mg total) by mouth daily., Disp: 90 capsule, Rfl: 1   traZODone (DESYREL) 50 MG tablet, Take 1 tablet (50 mg total) by mouth at bedtime as needed for sleep., Disp: 90 tablet, Rfl: 1   No Known Allergies   Social History   Tobacco Use   Smoking status: Some Days    Current packs/day: 0.25    Types: Cigarettes   Smokeless tobacco: Never   Tobacco comments:    6 cigs  per week.  Vaping Use   Vaping status: Never Used  Substance Use Topics   Alcohol use: Yes    Alcohol/week: 21.0 standard drinks of alcohol    Types: 21 Shots of liquor per week    Comment: 3 a day    Drug use: Yes    Types: Marijuana    Comment: occ use       Chart Review Today: I personally reviewed active problem list, medication list, allergies, family history, social history, health maintenance, notes from last encounter, lab results, imaging with the patient/caregiver today.   Review of Systems  Constitutional: Negative.   HENT: Negative.    Eyes: Negative.   Respiratory: Negative.    Cardiovascular: Negative.   Gastrointestinal: Negative.   Endocrine: Negative.   Genitourinary: Negative.   Musculoskeletal: Negative.   Skin: Negative.   Allergic/Immunologic: Negative.   Neurological: Negative.   Hematological: Negative.   Psychiatric/Behavioral: Negative.    All other systems reviewed and are negative.      Objective:   Vitals:   01/28/24 1404   BP: 134/72  Pulse: 74  Resp: 16  SpO2: 95%  Weight: 152 lb (68.9 kg)  Height: 5\' 4"  (1.626 m)    Body mass index is 26.09 kg/m.  Physical Exam Vitals and nursing note reviewed.  Constitutional:      Appearance: He is well-developed.  HENT:     Head: Normocephalic and atraumatic.     Comments: Continued tenderness to palpation to right cheek and around eye, right eye tearing, clear discharge, no edema noted to face no induration or erythema    Nose: Nose normal.  Eyes:     General:        Right eye: No discharge.  Left eye: No discharge.     Conjunctiva/sclera: Conjunctivae normal.  Neck:     Trachea: No tracheal deviation.  Cardiovascular:     Rate and Rhythm: Normal rate and regular rhythm.  Pulmonary:     Effort: Pulmonary effort is normal. No respiratory distress.     Breath sounds: No stridor.  Musculoskeletal:        General: Normal range of motion.  Skin:    General: Skin is warm and dry.     Findings: No rash.  Neurological:     Mental Status: He is alert.     Comments: intermittent twitch? to right side of face, excessive blinking and some twitching of right cheek No facial droop or noted asymmetry   Psychiatric:        Behavior: Behavior normal.      Results for orders placed or performed in visit on 01/07/24  CBC with Differential/Platelet   Collection Time: 01/07/24  2:55 PM  Result Value Ref Range   WBC 6.3 3.8 - 10.8 Thousand/uL   RBC 3.99 (L) 4.20 - 5.80 Million/uL   Hemoglobin 12.3 (L) 13.2 - 17.1 g/dL   HCT 56.2 (L) 13.0 - 86.5 %   MCV 88.5 80.0 - 100.0 fL   MCH 30.8 27.0 - 33.0 pg   MCHC 34.8 32.0 - 36.0 g/dL   RDW 78.4 69.6 - 29.5 %   Platelets 318 140 - 400 Thousand/uL   MPV 9.8 7.5 - 12.5 fL   Neutro Abs 3,723 1,500 - 7,800 cells/uL   Absolute Lymphocytes 1,588 850 - 3,900 cells/uL   Absolute Monocytes 800 200 - 950 cells/uL   Eosinophils Absolute 170 15 - 500 cells/uL   Basophils Absolute 19 0 - 200 cells/uL   Neutrophils  Relative % 59.1 %   Total Lymphocyte 25.2 %   Monocytes Relative 12.7 %   Eosinophils Relative 2.7 %   Basophils Relative 0.3 %  C-reactive protein   Collection Time: 01/07/24  2:55 PM  Result Value Ref Range   CRP 17.5 (H) <8.0 mg/L  Sedimentation rate   Collection Time: 01/07/24  2:55 PM  Result Value Ref Range   Sed Rate 28 (H) 0 - 20 mm/h       Assessment & Plan:   1. Facial pain (Primary) Fortunately patient's symptoms did not improve at all with antibiotics and steroids and unfortunately he did not get the MRI completed or connect with neurology despite attempting to coordinate care and communicate effectively He was given results, urgent referrals were placed and patient was given central scheduling number for follow-up on MRI after last office visit, more information was given today in person since the family member and the patient's are not getting the messages that are being left on the noted phone numbers Of note we had difficulty trying to get a hold of him today and yesterday to adjust and change his appointment - gabapentin (NEURONTIN) 300 MG capsule; Take 1 capsule (300 mg total) by mouth 3 (three) times daily.  Dispense: 90 capsule; Refill: 0 - MR Brain Wo Contrast  2. Neuropathic pain Gabapentin was previously prescribed but not picked up or started he previously tried and had no improvement with carbamazepine - gabapentin (NEURONTIN) 300 MG capsule; Take 1 capsule (300 mg total) by mouth 3 (three) times daily.  Dispense: 90 capsule; Refill: 0 - MR Brain Wo Contrast  3. Vertigo Some dizziness and progression of facial symptoms explained that I will try to get a stat  MRI today if possible, after working with referral coordinator and calling central scheduling with the assistance of multiple staff members they are unable to schedule or process the authorization for a new MRI order because the prior MRI from his PCP was approved and it just needs to be scheduled  - MR  Brain Wo Contrast  4. Excessive tear production, unspecified laterality Unclear etiology evident blinking and drainage/tearing from eye without signs of infection related to right sided facial pain and sensitivity sensation of swelling without any observed swelling there is definitely no cellulitis or precellulitis no edema erythema or induration on exam and no response to steroids and antibiotics  - MR Brain Wo Contrast  Reviewed differential with his variety of symptoms and did strongly encourage them to reach out to the neurology team for further I will follow-up, if any sudden acute or severe worsening this week and they were advised to go to the ER since we are unable to arrange a stat MRI today and he has not had any acute symptoms concerning for stroke   Patient now has scheduled a new patient appointment and will have an appointment to transition his care to this clinic for his primary care.      Adeline Hone, PA-C 01/28/24 2:22 PM

## 2024-01-29 ENCOUNTER — Ambulatory Visit
Admission: RE | Admit: 2024-01-29 | Discharge: 2024-01-29 | Disposition: A | Discharge status: Home / Self Care | Source: Ambulatory Visit | Attending: Family Medicine | Admitting: Family Medicine

## 2024-01-29 DIAGNOSIS — H04209 Unspecified epiphora, unspecified lacrimal gland: Secondary | ICD-10-CM | POA: Diagnosis not present

## 2024-01-29 DIAGNOSIS — R519 Headache, unspecified: Secondary | ICD-10-CM | POA: Insufficient documentation

## 2024-01-29 DIAGNOSIS — R42 Dizziness and giddiness: Secondary | ICD-10-CM | POA: Insufficient documentation

## 2024-01-29 DIAGNOSIS — M792 Neuralgia and neuritis, unspecified: Secondary | ICD-10-CM | POA: Insufficient documentation

## 2024-02-10 ENCOUNTER — Other Ambulatory Visit: Payer: Self-pay

## 2024-02-14 ENCOUNTER — Telehealth: Payer: Self-pay

## 2024-02-14 NOTE — Telephone Encounter (Signed)
 Copied from CRM [id number]. Topic: Referral - Question >> Feb 14, 2024 11:23 AM Phil Braun wrote: Reason for CRM:   REF ordered MRI needs to be reopened so that Imaging can get this scheduled asap. Dr Leisa ordered this as urgent and it has not been scheduled. Please open back up and advise patient of such.

## 2024-02-14 NOTE — Telephone Encounter (Signed)
 Called left vm we ordered imaging and it was resulted and pt was to follow-up with neurology no other imaging needed to be done through us .

## 2024-02-18 ENCOUNTER — Ambulatory Visit (HOSPITAL_COMMUNITY)

## 2024-02-24 ENCOUNTER — Ambulatory Visit (INDEPENDENT_AMBULATORY_CARE_PROVIDER_SITE_OTHER): Admitting: Family Medicine

## 2024-02-24 ENCOUNTER — Encounter: Payer: Self-pay | Admitting: Family Medicine

## 2024-02-24 ENCOUNTER — Other Ambulatory Visit: Payer: Self-pay

## 2024-02-24 VITALS — BP 140/80 | HR 62 | Resp 16 | Ht 64.0 in | Wt 150.0 lb

## 2024-02-24 DIAGNOSIS — R350 Frequency of micturition: Secondary | ICD-10-CM

## 2024-02-24 DIAGNOSIS — D649 Anemia, unspecified: Secondary | ICD-10-CM | POA: Diagnosis not present

## 2024-02-24 DIAGNOSIS — N1831 Chronic kidney disease, stage 3a: Secondary | ICD-10-CM | POA: Diagnosis not present

## 2024-02-24 DIAGNOSIS — M109 Gout, unspecified: Secondary | ICD-10-CM

## 2024-02-24 DIAGNOSIS — E785 Hyperlipidemia, unspecified: Secondary | ICD-10-CM | POA: Diagnosis not present

## 2024-02-24 DIAGNOSIS — G4701 Insomnia due to medical condition: Secondary | ICD-10-CM | POA: Diagnosis not present

## 2024-02-24 DIAGNOSIS — Z0000 Encounter for general adult medical examination without abnormal findings: Secondary | ICD-10-CM | POA: Diagnosis not present

## 2024-02-24 DIAGNOSIS — N401 Enlarged prostate with lower urinary tract symptoms: Secondary | ICD-10-CM

## 2024-02-24 DIAGNOSIS — I10 Essential (primary) hypertension: Secondary | ICD-10-CM

## 2024-02-24 DIAGNOSIS — F17200 Nicotine dependence, unspecified, uncomplicated: Secondary | ICD-10-CM | POA: Diagnosis not present

## 2024-02-24 DIAGNOSIS — M792 Neuralgia and neuritis, unspecified: Secondary | ICD-10-CM

## 2024-02-24 DIAGNOSIS — R519 Headache, unspecified: Secondary | ICD-10-CM

## 2024-02-24 MED ORDER — GABAPENTIN 300 MG PO CAPS
300.0000 mg | ORAL_CAPSULE | Freq: Three times a day (TID) | ORAL | 1 refills | Status: DC
  Filled 2024-02-24: qty 270, 45d supply, fill #0
  Filled 2024-07-11: qty 270, 45d supply, fill #1

## 2024-02-24 MED ORDER — TRAZODONE HCL 50 MG PO TABS
50.0000 mg | ORAL_TABLET | Freq: Every evening | ORAL | 1 refills | Status: DC | PRN
  Filled 2024-02-24: qty 90, 90d supply, fill #0
  Filled 2024-09-01: qty 90, 90d supply, fill #1

## 2024-02-24 MED ORDER — LISINOPRIL 40 MG PO TABS
40.0000 mg | ORAL_TABLET | Freq: Every day | ORAL | 1 refills | Status: DC
  Filled 2024-02-24: qty 90, 90d supply, fill #0
  Filled 2024-05-22: qty 90, 90d supply, fill #1

## 2024-02-24 MED ORDER — FINASTERIDE 5 MG PO TABS
5.0000 mg | ORAL_TABLET | Freq: Every day | ORAL | 1 refills | Status: AC
  Filled 2024-02-24 – 2024-04-14 (×2): qty 90, 90d supply, fill #0
  Filled 2024-09-01: qty 90, 90d supply, fill #1

## 2024-02-24 MED ORDER — HYDROCHLOROTHIAZIDE 25 MG PO TABS
25.0000 mg | ORAL_TABLET | Freq: Every day | ORAL | 1 refills | Status: DC
  Filled 2024-02-24 – 2024-05-22 (×3): qty 90, 90d supply, fill #0
  Filled 2024-09-01: qty 90, 90d supply, fill #1

## 2024-02-24 MED ORDER — TAMSULOSIN HCL 0.4 MG PO CAPS
0.4000 mg | ORAL_CAPSULE | Freq: Every day | ORAL | 1 refills | Status: DC
  Filled 2024-04-14: qty 90, 90d supply, fill #0

## 2024-02-24 MED ORDER — AMLODIPINE BESYLATE 10 MG PO TABS
10.0000 mg | ORAL_TABLET | Freq: Every day | ORAL | 1 refills | Status: DC
  Filled 2024-02-24 – 2024-04-14 (×2): qty 90, 90d supply, fill #0
  Filled 2024-09-01: qty 90, 90d supply, fill #1

## 2024-02-24 NOTE — Assessment & Plan Note (Signed)
 No recent flares, on colchicine  as needed for flares

## 2024-02-24 NOTE — Assessment & Plan Note (Signed)
 Unclear if pt has seen urology He reports last year PCP checked his prostate with DRE and she had no concerns IPSS done today, on 2 meds

## 2024-02-24 NOTE — Patient Instructions (Addendum)
 Follow up with neurology You can increase the gabapentin   I have increased your blood pressure med dose on lisinipril - please take it with HCTZ 25 and amlodipine  10 mg

## 2024-02-24 NOTE — Assessment & Plan Note (Signed)
 BP not well controlled today, pt also has pain likely elevated BP Increase lisinopril  dose and continue amlodipine  and HCTZ Close f/up BP recheck in about 1 month

## 2024-02-24 NOTE — Assessment & Plan Note (Signed)
 Continued pain, CRP and sed rate elevation, normal WBC count, no improvement with abx, steroids, and MRI done He is pending neurology OV 5/20 Previously tried Carbamezapine - ineffective per pt Started gabapentin  300 mg TID not much improvement to pain, increase/titrate up dose and f/up neurology

## 2024-02-24 NOTE — Assessment & Plan Note (Signed)
 Recheck labs

## 2024-02-24 NOTE — Assessment & Plan Note (Signed)
 Reviewed renal function over the past several years 38-57, most recently high 50's Recheck today

## 2024-02-24 NOTE — Assessment & Plan Note (Signed)
 On pravastatin  for several years, seems to be tolerating this, but no lipid panel has been done for years. Check labs today and trend labs, will adjust med if needed - discussed this with pt and sister Lab Results  Component Value Date   CHOL 197 03/11/2020   HDL 54 03/11/2020   LDLCALC 116 (H) 03/11/2020   TRIG 155 (H) 03/11/2020   CHOLHDL 3.6 03/11/2020

## 2024-02-24 NOTE — Assessment & Plan Note (Signed)
 Onset 2 years ago with the death of his son, doesn't use trazodone  very often

## 2024-02-24 NOTE — Progress Notes (Signed)
 Name: Daniel Haynes   MRN: [id number]    DOB: Feb 18, 1951   Date:02/24/2024       Progress Note  Chief Complaint  Patient presents with   Establish Care   Facial Pain    Gabapentin  not helping, sees Neuro on 03/07/24.     Subjective:   Daniel Haynes is a 73 y.o. male, presents to clinic to est care, transition from PCP in [place], last OV   HTN on multiple meds but also new chronic pain, not controlled BP elevated today BP Readings from Last 10 Encounters:  02/24/24 (!) 140/80  01/28/24 134/72  01/07/24 (!) 180/100  12/10/23 120/66  12/04/23 (!) 157/86  11/12/23 (!) 172/89  10/26/23 (!) 160/83  10/27/21 133/68  07/28/21 (!) 178/87  05/05/21 (!) 155/78     With pcp on statin Lab Results  Component Value Date   CHOL 197 03/11/2020   HDL 54 03/11/2020   LDLCALC 116 (H) 03/11/2020   TRIG 155 (H) 03/11/2020   CHOLHDL 3.6 03/11/2020   Gout -  Lab Results  Component Value Date   LABURIC 7.0 10/26/2023    BPH on nocturia - proscar  and tamsulosin , doesn't know past urology info? Or if he's done other testing other than DRE  Lab Results  Component Value Date   PSA1 0.4 03/11/2020   PSA1 0.4 08/25/2019   PSA 0.3 06/29/2017   PSA 0.33 10/09/2009   PSA 0.34 05/17/2008   SDOH Screenings   Food Insecurity: Food Insecurity Present (02/22/2024)  Housing: Unknown (02/22/2024)  Transportation Needs: No Transportation Needs (02/22/2024)  Utilities: Not At Risk (02/24/2024)  Alcohol Screen: Low Risk  (02/22/2024)  Depression (PHQ2-9): High Risk (02/24/2024)  Financial Resource Strain: Medium Risk (02/22/2024)  Physical Activity: Insufficiently Active (02/22/2024)  Social Connections: Moderately Isolated (02/22/2024)  Stress: Stress Concern Present (02/22/2024)  Tobacco Use: High Risk (02/24/2024)  Health Literacy: Adequate Health Literacy (02/24/2024)   Facial pain/neuropathy/HA Pt reports increased liquor use, cocaine, smoking due to pain, just severe and frequent, starting to really  interfere with job, sleep and mental health.- no improvement with gabapentin , he is taking sometime 600 mg dose when pain is really bad, neurology OV in about 2 weeks       Current Outpatient Medications:    colchicine  0.6 MG tablet, TAKE 2 TABLETS (1.2MG ) BY MOUTH AT FIRST SIGN OF FLARE,FOLLOWED BY 1 TABLET (0.6MG ) AFTER 1 HOUR, Disp: 30 tablet, Rfl: 5   pravastatin  (PRAVACHOL ) 40 MG tablet, Take 1 tablet (40 mg total) by mouth daily., Disp: 90 tablet, Rfl: 1   amLODipine  (NORVASC ) 10 MG tablet, Take 1 tablet (10 mg total) by mouth daily., Disp: 90 tablet, Rfl: 1   finasteride  (PROSCAR ) 5 MG tablet, Take 1 tablet (5 mg total) by mouth daily., Disp: 90 tablet, Rfl: 1   gabapentin  (NEURONTIN ) 300 MG capsule, Take 1-2 capsules (300-600 mg total) by mouth 3 (three) times daily., Disp: 270 capsule, Rfl: 1   hydrochlorothiazide  (HYDRODIURIL ) 25 MG tablet, Take 1 tablet (25 mg total) by mouth daily., Disp: 90 tablet, Rfl: 1   lisinopril  (ZESTRIL ) 40 MG tablet, Take 1 tablet (40 mg total) by mouth daily., Disp: 90 tablet, Rfl: 1   tamsulosin  (FLOMAX ) 0.4 MG CAPS capsule, Take 1 capsule (0.4 mg total) by mouth daily., Disp: 90 capsule, Rfl: 1   traZODone  (DESYREL ) 50 MG tablet, Take 1 tablet (50 mg total) by mouth at bedtime as needed for sleep., Disp: 90 tablet, Rfl: 1  Patient Active Problem List  Diagnosis Date Noted   Anemia 02/24/2024   Facial pain 12/10/2023   Tobacco use disorder 12/10/2023   Encounter for examination following treatment at hospital 12/10/2023   Pain, dental 10/26/2023   Pain in both hands 10/26/2023   Insomnia due to medical condition 10/26/2023   Benign prostatic hyperplasia with urinary frequency 10/26/2023   Hyperlipidemia 10/26/2023   Essential hypertension 10/26/2023   Need for influenza vaccination 10/26/2023   Paresthesia 06/08/2022   Chronic kidney disease (CKD) stage G3a/A1, moderately decreased glomerular filtration rate (GFR) between 45-59 mL/min/1.73  square meter and albuminuria creatinine ratio less than 30 mg/g (HCC) 06/06/2020   Urinary frequency 07/31/2017   Accelerated hypertension 02/17/2016   Frequency of urination 02/17/2016   Gout 02/17/2016    Past Surgical History:  Procedure Laterality Date   CERVICAL DISCECTOMY  10/19/2002   ACDF C3 to C6 [facility]    Family History  Problem Relation Age of Onset   Cancer Mother    Hypertension Mother    Cancer Father    Alcohol abuse Father    Arthritis Sister    Hypertension Sister    Diabetes Sister    Hypertension Sister    Kidney disease Sister    Vision loss Sister    Drug abuse Brother    Colon cancer Neg Hx    Colon polyps Neg Hx    Esophageal cancer Neg Hx    Rectal cancer Neg Hx    Stomach cancer Neg Hx     Social History   Tobacco Use   Smoking status: Some Days    Current packs/day: 0.25    Types: Cigarettes   Smokeless tobacco: Never   Tobacco comments:    2 cigs a week, was previously quit, restarted since facial pain early 2025  Vaping Use   Vaping status: Never Used  Substance Use Topics   Alcohol use: Not Currently    Alcohol/week: 10.0 standard drinks of alcohol    Types: 10 Shots of liquor per week   Drug use: Not Currently    Types: Marijuana    Comment: occ use      No Known Allergies  Health Maintenance  Topic Date Due   Medicare Annual Wellness (AWV)  Never done   COVID-19 Vaccine (6 - 2024-25 season) 06/20/2023   Zoster Vaccines- Shingrix (1 of 2) 05/26/2024 (Originally 02/26/2001)   INFLUENZA VACCINE  05/19/2024   Colonoscopy  08/21/2026   DTaP/Tdap/Td (2 - Td or Tdap) 12/29/2026   Pneumonia Vaccine 60+ Years old  Completed   Hepatitis C Screening  Completed   HPV VACCINES  Aged Out   Meningococcal B Vaccine  Aged Out    Chart Review Today: I personally reviewed active problem list, medication list, allergies, family history, social history, health maintenance, notes from last encounter, lab results, imaging with the  patient/caregiver today.   Review of Systems  All other systems reviewed and are negative.    Objective:   Vitals:   02/24/24 1016 02/24/24 1042  BP: (!) 152/78 (!) 140/80  Pulse: 62   Resp: 16   SpO2: 97%   Weight: 150 lb (68 kg)   Height: 5\' 4"  (1.626 m)     Body mass index is 25.75 kg/m.  Physical Exam Vitals and nursing note reviewed.  Constitutional:      General: He is not in acute distress.    Appearance: He is well-developed. He is not ill-appearing, toxic-appearing or diaphoretic.  HENT:     Head: Normocephalic  and atraumatic.     Right Ear: External ear normal.     Left Ear: External ear normal.     Nose: Nose normal.  Eyes:     General:        Right eye: Right eye discharge: clear tearing.        Left eye: No discharge.     Conjunctiva/sclera: Conjunctivae normal.  Neck:     Trachea: No tracheal deviation.  Cardiovascular:     Rate and Rhythm: Normal rate and regular rhythm.     Pulses: Normal pulses.     Heart sounds: Normal heart sounds.  Pulmonary:     Effort: Pulmonary effort is normal. No respiratory distress.     Breath sounds: Normal breath sounds. No stridor. No wheezing, rhonchi or rales.  Musculoskeletal:     Right lower leg: No edema.     Left lower leg: No edema.  Skin:    General: Skin is warm and dry.     Findings: No rash.  Neurological:     Mental Status: He is alert. Mental status is at baseline.     Motor: No abnormal muscle tone.     Coordination: Coordination normal.  Psychiatric:        Mood and Affect: Mood is depressed. Mood is not anxious.        Speech: Speech normal.        Behavior: Behavior normal. Behavior is cooperative.      Functional Status Survey: Is the patient deaf or have difficulty hearing?: No Does the patient have difficulty seeing, even when wearing glasses/contacts?: Yes Does the patient have difficulty concentrating, remembering, or making decisions?: No Does the patient have difficulty walking or  climbing stairs?: Yes Does the patient have difficulty dressing or bathing?: No Does the patient have difficulty doing errands alone such as visiting a doctor's office or shopping?: No Results for orders placed or performed in visit on 01/07/24  CBC with Differential/Platelet   Collection Time: 01/07/24  2:55 PM  Result Value Ref Range   WBC 6.3 3.8 - 10.8 Thousand/uL   RBC 3.99 (L) 4.20 - 5.80 Million/uL   Hemoglobin 12.3 (L) 13.2 - 17.1 g/dL   HCT 16.1 (L) 09.6 - 04.5 %   MCV 88.5 80.0 - 100.0 fL   MCH 30.8 27.0 - 33.0 pg   MCHC 34.8 32.0 - 36.0 g/dL   RDW 40.9 81.1 - 91.4 %   Platelets 318 140 - 400 Thousand/uL   MPV 9.8 7.5 - 12.5 fL   Neutro Abs 3,723 1,500 - 7,800 cells/uL   Absolute Lymphocytes 1,588 850 - 3,900 cells/uL   Absolute Monocytes 800 200 - 950 cells/uL   Eosinophils Absolute 170 15 - 500 cells/uL   Basophils Absolute 19 0 - 200 cells/uL   Neutrophils Relative % 59.1 %   Total Lymphocyte 25.2 %   Monocytes Relative 12.7 %   Eosinophils Relative 2.7 %   Basophils Relative 0.3 %  C-reactive protein   Collection Time: 01/07/24  2:55 PM  Result Value Ref Range   CRP 17.5 (H) <8.0 mg/L  Sedimentation rate   Collection Time: 01/07/24  2:55 PM  Result Value Ref Range   Sed Rate 28 (H) 0 - 20 mm/h      Assessment & Plan:   Encounter for medical examination to establish care Reviewed prior PCP OV labs/meds back several years, updated chart as able today -     Comprehensive metabolic panel with GFR -  Lipid panel -     CBC with Differential/Platelet  Chronic kidney disease (CKD) stage G3a/A1, moderately decreased glomerular filtration rate (GFR) between 45-59 mL/min/1.73 square meter and albuminuria creatinine ratio less than 30 mg/g (HCC) Assessment & Plan: Reviewed renal function over the past several years 38-57, most recently high 50's Recheck today  Orders: -     Comprehensive metabolic panel with GFR  Accelerated hypertension Assessment &  Plan: BP not well controlled today, pt also has pain likely elevated BP Increase lisinopril  dose and continue amlodipine  and HCTZ Close f/up BP recheck in about 1 month  Orders: -     Comprehensive metabolic panel with GFR -     amLODIPine  Besylate; Take 1 tablet (10 mg total) by mouth daily.  Dispense: 90 tablet; Refill: 1 -     hydroCHLOROthiazide ; Take 1 tablet (25 mg total) by mouth daily.  Dispense: 90 tablet; Refill: 1 -     Lisinopril ; Take 1 tablet (40 mg total) by mouth daily.  Dispense: 90 tablet; Refill: 1  Hyperlipidemia, unspecified hyperlipidemia type Assessment & Plan: On pravastatin  for several years, seems to be tolerating this, but no lipid panel has been done for years. Check labs today and trend labs, will adjust med if needed - discussed this with pt and sister Lab Results  Component Value Date   CHOL 197 03/11/2020   HDL 54 03/11/2020   LDLCALC 116 (H) 03/11/2020   TRIG 155 (H) 03/11/2020   CHOLHDL 3.6 03/11/2020   Orders: -     Comprehensive metabolic panel with GFR -     Lipid panel  Gout, unspecified cause, unspecified chronicity, unspecified site Assessment & Plan: No recent flares, on colchicine  as needed for flares    Benign prostatic hyperplasia with urinary frequency Assessment & Plan: Unclear if pt has seen urology He reports last year PCP checked his prostate with DRE and she had no concerns IPSS done today, on 2 meds  Orders: -     Tamsulosin  HCl; Take 1 capsule (0.4 mg total) by mouth daily.  Dispense: 90 capsule; Refill: 1 -     Finasteride ; Take 1 tablet (5 mg total) by mouth daily.  Dispense: 90 tablet; Refill: 1  Tobacco use disorder  Recently restarted smoking a few cigarettes a day due to severe pain and stress/moods  Anemia, unspecified type Assessment & Plan: Recheck labs  Orders: -     CBC with Differential/Platelet  Insomnia due to medical condition Assessment & Plan: Onset 2 years ago with the death of his son, doesn't  use trazodone  very often  Orders: -     traZODone  HCl; Take 1 tablet (50 mg total) by mouth at bedtime as needed for sleep.  Dispense: 90 tablet; Refill: 1  Facial pain Assessment & Plan: Continued pain, CRP and sed rate elevation, normal WBC count, no improvement with abx, steroids, and MRI done He is pending neurology OV 5/20 Previously tried Carbamezapine - ineffective per pt Started gabapentin  300 mg TID not much improvement to pain, increase/titrate up dose and f/up neurology   Orders: -     Gabapentin ; Take 1-2 capsules (300-600 mg total) by mouth 3 (three) times daily.  Dispense: 270 capsule; Refill: 1  Neuropathic pain -     Gabapentin ; Take 1-2 capsules (300-600 mg total) by mouth 3 (three) times daily.  Dispense: 270 capsule; Refill: 1     Return for 1 month virtual or in office HTN f/up, 3 month routine f/up.   Daniel Haynes  Albesa Huguenin 02/24/24 11:43 AM

## 2024-02-25 ENCOUNTER — Other Ambulatory Visit: Payer: Self-pay | Admitting: Family Medicine

## 2024-02-25 DIAGNOSIS — M792 Neuralgia and neuritis, unspecified: Secondary | ICD-10-CM

## 2024-02-25 DIAGNOSIS — R519 Headache, unspecified: Secondary | ICD-10-CM

## 2024-02-25 LAB — CBC WITH DIFFERENTIAL/PLATELET
Absolute Lymphocytes: 1746 {cells}/uL (ref 850–3900)
Absolute Monocytes: 626 {cells}/uL (ref 200–950)
Basophils Absolute: 17 {cells}/uL (ref 0–200)
Basophils Relative: 0.3 %
Eosinophils Absolute: 267 {cells}/uL (ref 15–500)
Eosinophils Relative: 4.6 %
HCT: 37.2 % — ABNORMAL LOW (ref 38.5–50.0)
Hemoglobin: 12.6 g/dL — ABNORMAL LOW (ref 13.2–17.1)
MCH: 31 pg (ref 27.0–33.0)
MCHC: 33.9 g/dL (ref 32.0–36.0)
MCV: 91.4 fL (ref 80.0–100.0)
MPV: 9.7 fL (ref 7.5–12.5)
Monocytes Relative: 10.8 %
Neutro Abs: 3144 {cells}/uL (ref 1500–7800)
Neutrophils Relative %: 54.2 %
Platelets: 296 10*3/uL (ref 140–400)
RBC: 4.07 10*6/uL — ABNORMAL LOW (ref 4.20–5.80)
RDW: 13.2 % (ref 11.0–15.0)
Total Lymphocyte: 30.1 %
WBC: 5.8 10*3/uL (ref 3.8–10.8)

## 2024-02-25 LAB — LIPID PANEL
Cholesterol: 201 mg/dL — ABNORMAL HIGH (ref <200)
HDL: 59 mg/dL (ref >=40)
LDL Cholesterol (Calc): 120 mg/dL — ABNORMAL HIGH
Non-HDL Cholesterol (Calc): 142 mg/dL — ABNORMAL HIGH (ref <130)
Total CHOL/HDL Ratio: 3.4 (calc) (ref <5.0)
Triglycerides: 113 mg/dL (ref <150)

## 2024-02-25 LAB — COMPREHENSIVE METABOLIC PANEL WITH GFR
AG Ratio: 1.7 (calc) (ref 1.0–2.5)
ALT: 10 U/L (ref 9–46)
AST: 14 U/L (ref 10–35)
Albumin: 4.6 g/dL (ref 3.6–5.1)
Alkaline phosphatase (APISO): 78 U/L (ref 35–144)
BUN: 20 mg/dL (ref 7–25)
CO2: 30 mmol/L (ref 20–32)
Calcium: 9.7 mg/dL (ref 8.6–10.3)
Chloride: 105 mmol/L (ref 98–110)
Creat: 1.15 mg/dL (ref 0.70–1.28)
Globulin: 2.7 g/dL (ref 1.9–3.7)
Glucose, Bld: 85 mg/dL (ref 65–99)
Potassium: 4.4 mmol/L (ref 3.5–5.3)
Sodium: 143 mmol/L (ref 135–146)
Total Bilirubin: 0.7 mg/dL (ref 0.2–1.2)
Total Protein: 7.3 g/dL (ref 6.1–8.1)
eGFR: 68 mL/min/{1.73_m2} (ref >=60)

## 2024-02-28 ENCOUNTER — Encounter: Payer: Self-pay | Admitting: Family Medicine

## 2024-02-28 ENCOUNTER — Other Ambulatory Visit: Payer: Self-pay

## 2024-02-28 ENCOUNTER — Other Ambulatory Visit: Payer: Self-pay | Admitting: Family Medicine

## 2024-02-28 DIAGNOSIS — E785 Hyperlipidemia, unspecified: Secondary | ICD-10-CM

## 2024-02-28 MED ORDER — ROSUVASTATIN CALCIUM 10 MG PO TABS
10.0000 mg | ORAL_TABLET | Freq: Every day | ORAL | 1 refills | Status: DC
  Filled 2024-02-28 – 2024-05-26 (×2): qty 90, 90d supply, fill #0
  Filled 2024-09-01: qty 90, 90d supply, fill #1

## 2024-02-28 NOTE — Telephone Encounter (Signed)
 Requested Prescriptions  Refused Prescriptions Disp Refills   gabapentin  (NEURONTIN ) 300 MG capsule [Pharmacy Med Name: GABAPENTIN  300MG  CAPSULES] 90 capsule     Sig: TAKE 1 CAPSULE(300 MG) BY MOUTH THREE TIMES DAILY     Neurology: Anticonvulsants - gabapentin  Passed - 02/28/2024  2:19 PM      Passed - Cr in normal range and within 360 days    Creat  Date Value Ref Range Status  02/24/2024 1.15 0.70 - 1.28 mg/dL Final         Passed - Completed PHQ-2 or PHQ-9 in the last 360 days      Passed - Valid encounter within last 12 months    Recent Outpatient Visits           4 days ago Encounter for medical examination to establish care   [facility] Adeline Hone, PA-C   1 month ago Facial pain   [facility] Adeline Hone, PA-C   1 month ago Facial pain   [facility] Adeline Hone, [place]       Future Appointments             In 1 week Patel, Donika K, DO [facility] Neurology

## 2024-03-07 ENCOUNTER — Ambulatory Visit: Admitting: Neurology

## 2024-03-07 ENCOUNTER — Telehealth: Payer: Self-pay | Admitting: Neurology

## 2024-03-07 ENCOUNTER — Other Ambulatory Visit: Payer: Self-pay

## 2024-03-07 ENCOUNTER — Encounter: Payer: Self-pay | Admitting: Neurology

## 2024-03-07 VITALS — BP 125/77 | HR 56 | Ht 64.0 in | Wt 158.0 lb

## 2024-03-07 DIAGNOSIS — G44099 Other trigeminal autonomic cephalgias (TAC), not intractable: Secondary | ICD-10-CM | POA: Diagnosis not present

## 2024-03-07 DIAGNOSIS — G500 Trigeminal neuralgia: Secondary | ICD-10-CM | POA: Diagnosis not present

## 2024-03-07 MED ORDER — CARBAMAZEPINE 200 MG PO TABS
ORAL_TABLET | ORAL | 3 refills | Status: DC
Start: 2024-03-07 — End: 2024-03-29
  Filled 2024-03-07: qty 90, 30d supply, fill #0

## 2024-03-07 NOTE — Patient Instructions (Addendum)
 Start carbamazepine  200mg  - Take 1 tablet twice daily x 1 week, then take 1 tablet three times daily.  Stop immediately if you develop a rash.   Reduce gabapentin  to 300mg  three times daily  Start Vitamin B12 1000mcg daily  MRI face

## 2024-03-07 NOTE — Telephone Encounter (Signed)
 Ordered placed

## 2024-03-07 NOTE — Addendum Note (Signed)
 Addended by: Rox Cope A on: 03/07/2024 12:26 PM   Modules accepted: Orders

## 2024-03-07 NOTE — Telephone Encounter (Signed)
 Referral sent with contrast and needs to say MRA head without contrast

## 2024-03-07 NOTE — Progress Notes (Signed)
 [facility] Clinic Note - Initial Visit   Date: 03/07/2024   Daniel Haynes MRN: [id number] DOB: 1950-12-31   Dear Daniel Hone, PA-C:  Thank you for your kind referral of Daniel Haynes for consultation of facial pain. Although his history is well known to you, please allow us  to reiterate it for the purpose of our medical record. The patient was accompanied to the clinic by sister who also provides collateral information.     Daniel Haynes is a 73 y.o. right-handed male with hypertension, hyperlipidemia, BPH, and gout presenting for evaluation of right facial pain.   IMPRESSION/PLAN: Atypical facial pain involving the right side, symptom are suggestive of trigeminal neuralgia vs trigeminal autonomic cephalgia.  Prior MRI brain shows right parafalcine meningioma, which would not be causing his facial pain.    - MRI face trigeminal wwo contrast  - MRA head given exertional pain  - Start carbamazepine  200mg  BID x 1 week, then increase to 200mg  TID.  Further titrate as tolerable.  Side effects discussed. Counseled to stop medication immediately for any rash.    - Consider lamotrigine going forward  - Reduce gabapentin  300mg  three times daily  - Start vitamin B12 1000mcg daily (low-normal vitamin B12)  Return to clinic in 6 weeks  ------------------------------------------------------------- History of present illness: Starting around January 2025, he started having right facial pain described as throbbing, which starts in the the right cheek and radiates towards the jaw and ear.  Pain lasts about 5-6 seconds and occurs several times per hour.  Chewing, talking, any physical activity can trigger the pain.  He does not have sharp, stabbing pain.  The pain also bothers him at night.  He saw his dentist who extracted a tooth, but there was no change.  He has noticed associated tearing and feels as if he has a post-nasal drop on the right side.  He also has fullness of the  right cheek when his pain occurs. He stakes gabapentin  600mg  three times daily, which does not provide any relief.  He was on carbamazepine  100mg  BID and notes suggest there was some improvement, but he does not recall this.   He smokes cigarettes.  He drinks moderate alcohol, 2-3 times per week.  He work in [employer].    Out-side paper records, electronic medical record, and images have been reviewed where available and summarized as:  MRI brain wo contrast 01/29/2024: No specific cause for headache. Suspect a 13 x 5 mm right parafalcine meningioma. Could obtain postcontrast imaging for further assessment and follow-up purposes. Presumed chronic small vessel ischemia in the cerebral white matter.  Lab Results  Component Value Date   HGBA1C 5.1 08/25/2019   Lab Results  Component Value Date   VITAMINB12 267 12/23/2020   Lab Results  Component Value Date   TSH 0.744 08/25/2019   Lab Results  Component Value Date   ESRSEDRATE 28 (H) 01/07/2024    Past Medical History:  Diagnosis Date   Anxiety    Arthritis    hands fingers wrists    Depression    Gout    Hyperlipidemia    Hypertension    Insomnia 08/2019   Neuromuscular disorder [facility])     Past Surgical History:  Procedure Laterality Date   CERVICAL DISCECTOMY  10/19/2002   ACDF C3 to C6 [facility]     Medications:  Outpatient Encounter Medications as of 03/07/2024  Medication Sig   amLODipine  (NORVASC ) 10 MG tablet Take 1 tablet (10 mg total)  by mouth daily.   carbamazepine  (TEGRETOL ) 200 MG tablet Take 1 tablet twice daily x 1 week, then take 1 tablet three times daily.   colchicine  0.6 MG tablet TAKE 2 TABLETS (1.2MG ) BY MOUTH AT FIRST SIGN OF FLARE,FOLLOWED BY 1 TABLET (0.6MG ) AFTER 1 HOUR   finasteride  (PROSCAR ) 5 MG tablet Take 1 tablet (5 mg total) by mouth daily.   gabapentin  (NEURONTIN ) 300 MG capsule Take 1-2 capsules (300-600 mg total) by mouth 3 (three) times daily. (Patient taking differently: Take  300-600 mg by mouth 3 (three) times daily. Tae two capsules three times a day)   hydrochlorothiazide  (HYDRODIURIL ) 25 MG tablet Take 1 tablet (25 mg total) by mouth daily.   lisinopril  (ZESTRIL ) 40 MG tablet Take 1 tablet (40 mg total) by mouth daily.   pravastatin  (PRAVACHOL ) 40 MG tablet Take 40 mg by mouth daily.   rosuvastatin  (CRESTOR ) 10 MG tablet Take 1 tablet (10 mg total) by mouth daily.   tamsulosin  (FLOMAX ) 0.4 MG CAPS capsule Take 1 capsule (0.4 mg total) by mouth daily.   traZODone  (DESYREL ) 50 MG tablet Take 1 tablet (50 mg total) by mouth at bedtime as needed for sleep.   No facility-administered encounter medications on file as of 03/07/2024.    Allergies: No Known Allergies  Family History: Family History  Problem Relation Age of Onset   Cancer Mother    Hypertension Mother    Cancer Father    Alcohol abuse Father    Arthritis Sister    Hypertension Sister    Diabetes Sister    Hypertension Sister    Kidney disease Sister    Vision loss Sister    Drug abuse Brother    Colon cancer Neg Hx    Colon polyps Neg Hx    Esophageal cancer Neg Hx    Rectal cancer Neg Hx    Stomach cancer Neg Hx     Social History: Social History   Tobacco Use   Smoking status: Some Days    Current packs/day: 0.25    Types: Cigarettes   Smokeless tobacco: Never   Tobacco comments:    2 cigs a week, was previously quit, restarted since facial pain early 2025  Vaping Use   Vaping status: Never Used  Substance Use Topics   Alcohol use: Yes    Alcohol/week: 10.0 standard drinks of alcohol    Types: 10 Shots of liquor per week    Comment: Occasional Drink   Drug use: Not Currently    Types: Marijuana    Comment: occ use    Social History   Social History Narrative   Lives with his son    Are you right handed or left handed? Right Handed   Are you currently employed ? Yes   What is your current occupation?   Do you live at home alone? No    Who lives with you? Lives with  son    What type of home do you live in: 1 story or 2 story? One story apartment         Vital Signs:  BP 125/77   Pulse (!) 56   Ht 5\' 4"  (1.626 m)   Wt 158 lb (71.7 kg)   SpO2 99%   BMI 27.12 kg/m    Neurological Exam: MENTAL STATUS including orientation to time, place, person, recent and remote memory, attention span and concentration, language, and fund of knowledge is normal.  Speech is not dysarthric.  CRANIAL NERVES: II:  No visual field defects.     III-IV-VI: Pupils equal round and reactive to light.  Normal conjugate, extra-ocular eye movements in all directions of gaze.  No nystagmus.  No ptosis. There is mild lacrimation from right eye.   V:  Normal facial sensation.    VII:  Normal facial symmetry and movements.   VIII:  Normal hearing and vestibular function.   IX-X:  Normal palatal movement.   XI:  Normal shoulder shrug and head rotation.   XII:  Normal tongue strength and range of motion, no deviation or fasciculation.  MOTOR:  Motor strength is 5/5 throughout. No atrophy, fasciculations or abnormal movements.  No pronator drift.   MSRs:                                           Right        Left brachioradialis 2+  2+  biceps 2+  2+  triceps 2+  2+  patellar 2+  2+  ankle jerk 2+  2+  Hoffman no  no  plantar response down  down   SENSORY:  Normal and symmetric perception of light touch, pinprick, vibration, and temperature.  Romberg's sign absent.   COORDINATION/GAIT: Normal finger-to- nose-finger.  Intact rapid alternating movements bilaterally.  Able to rise from a chair without using arms.  Gait narrow based and stable.  Stressed and tandem gait intact.   Thank you for allowing me to participate in patient's care.  If I can answer any additional questions, I would be pleased to do so.    Sincerely,    Kymoni Lesperance K. Lydia Sams, DO

## 2024-03-09 ENCOUNTER — Encounter: Payer: Self-pay | Admitting: Neurology

## 2024-03-17 ENCOUNTER — Ambulatory Visit
Admission: RE | Admit: 2024-03-17 | Discharge: 2024-03-17 | Disposition: A | Discharge status: Home / Self Care | Source: Ambulatory Visit | Attending: Neurology | Admitting: Neurology

## 2024-03-17 DIAGNOSIS — I6501 Occlusion and stenosis of right vertebral artery: Secondary | ICD-10-CM | POA: Diagnosis not present

## 2024-03-17 DIAGNOSIS — G44099 Other trigeminal autonomic cephalgias (TAC), not intractable: Secondary | ICD-10-CM

## 2024-03-17 DIAGNOSIS — G500 Trigeminal neuralgia: Secondary | ICD-10-CM

## 2024-03-28 NOTE — Progress Notes (Unsigned)
 Follow-up Visit   Date: 03/29/2024    Daniel Haynes MRN: [id number] DOB: 1951/03/29    Daniel Haynes is a 73 y.o. right-handed  male with hypertension, hyperlipidemia, BPH, and gout returning to the clinic for follow-up of right facial pain.  The patient was accompanied to the clinic by self.   IMPRESSION/PLAN: Atypical right facial pain suggestive of trigeminal neuralgia vs. Trigeminal autonomic cephalagia Prior MRI brain shows right parafalcine meningioma, which would not be causing his facial pain.    - MRA head is pending  - Continue gabapentin  600mg  three times daily  - Increase carbamazepine  to 200mg  three times daily x 1 week, then increase to carbamazepine  300mg  (1.5 tablet) three times daily  - MyChart update in 2-3 weeks  - Going forward consider lamotrigine, pregabalin , or duloxetine  Return to clinic in 6 weeks  --------------------------------------------- History of present illness: Starting around January 2025, he started having right facial pain described as throbbing, which starts in the the right cheek and radiates towards the jaw and ear.  Pain lasts about 5-6 seconds and occurs several times per hour.  Chewing, talking, any physical activity can trigger the pain.  He does not have sharp, stabbing pain.  The pain also bothers him at night.  He saw his dentist who extracted a tooth, but there was no change.  He has noticed associated tearing and feels as if he has a post-nasal drop on the right side.  He also has fullness of the right cheek when his pain occurs. He stakes gabapentin  600mg  three times daily, which does not provide any relief.  He was on carbamazepine  100mg  BID and notes suggest there was some improvement, but he does not recall this.    He smokes cigarettes.  He drinks moderate alcohol, 2-3 times per week.  He work in [employer].    UPDATE 03/28/2024:   He is here for follow-up visit.  He takes carbamazepine  200mg  BID and gabapentin  600mg  TID.   He reports that the pain is slightly better with reduced intensity and frequency, but it has not changed his quality of life.  Pain continues to be daily and occurs multiple times per day.  When present, it is brief for 5-10 seconds, but recurs.  It interferes with his ability to talk, chew, eat, and sleep.    Medications:  Current Outpatient Medications on File Prior to Visit  Medication Sig Dispense Refill   amLODipine  (NORVASC ) 10 MG tablet Take 1 tablet (10 mg total) by mouth daily. 90 tablet 1   carbamazepine  (TEGRETOL ) 200 MG tablet Take 1 tablet (200 mg total) by mouth 2 (two) times daily for 7 days, THEN 1 tablet (200 mg total) 3 (three) times daily. 90 tablet 3   colchicine  0.6 MG tablet TAKE 2 TABLETS (1.2MG ) BY MOUTH AT FIRST SIGN OF FLARE,FOLLOWED BY 1 TABLET (0.6MG ) AFTER 1 HOUR 30 tablet 5   finasteride  (PROSCAR ) 5 MG tablet Take 1 tablet (5 mg total) by mouth daily. 90 tablet 1   gabapentin  (NEURONTIN ) 300 MG capsule Take 1-2 capsules (300-600 mg total) by mouth 3 (three) times daily. (Patient taking differently: Take 300-600 mg by mouth 3 (three) times daily. Take two capsules three times a day) 270 capsule 1   hydrochlorothiazide  (HYDRODIURIL ) 25 MG tablet Take 1 tablet (25 mg total) by mouth daily. 90 tablet 1   lisinopril  (ZESTRIL ) 40 MG tablet Take 1 tablet (40 mg total) by mouth daily. 90 tablet 1   pravastatin  (PRAVACHOL )  40 MG tablet Take 40 mg by mouth daily.     rosuvastatin  (CRESTOR ) 10 MG tablet Take 1 tablet (10 mg total) by mouth daily. 90 tablet 1   tamsulosin  (FLOMAX ) 0.4 MG CAPS capsule Take 1 capsule (0.4 mg total) by mouth daily. 90 capsule 1   traZODone  (DESYREL ) 50 MG tablet Take 1 tablet (50 mg total) by mouth at bedtime as needed for sleep. 90 tablet 1   No current facility-administered medications on file prior to visit.    Allergies: No Known Allergies  Vital Signs:  BP (!) 193/97   Pulse 73   Ht 5' 4 (1.626 m)   Wt 158 lb (71.7 kg)   SpO2 96%    BMI 27.12 kg/m    Neurological Exam: MENTAL STATUS including orientation to time, place, person, recent and remote memory, attention span and concentration, language, and fund of knowledge is normal.  Speech is not dysarthric.  CRANIAL NERVES:  No visual field defects.  Pupils equal round and reactive to light.  Normal conjugate, extra-ocular eye movements in all directions of gaze.  No ptosis.  Face is symmetric. Palate elevates symmetrically.  Tongue is midline.  MOTOR:  Motor strength is 5/5 in all extremities.  No atrophy, fasciculations or abnormal movements.  No pronator drift.  Tone is normal.    COORDINATION/GAIT:  Gait narrow based and stable.   Data: MRI brain wo contrast 01/29/2024: No specific cause for headache. Suspect a 13 x 5 mm right parafalcine meningioma. Could obtain postcontrast imaging for further assessment and follow-up purposes. Presumed chronic small vessel ischemia in the cerebral white matter.    Thank you for allowing me to participate in patient's care.  If I can answer any additional questions, I would be pleased to do so.    Sincerely,    Andrya Roppolo K. Lydia Sams, DO

## 2024-03-29 ENCOUNTER — Other Ambulatory Visit: Payer: Self-pay

## 2024-03-29 ENCOUNTER — Ambulatory Visit (INDEPENDENT_AMBULATORY_CARE_PROVIDER_SITE_OTHER): Admitting: Neurology

## 2024-03-29 ENCOUNTER — Encounter: Payer: Self-pay | Admitting: Neurology

## 2024-03-29 VITALS — BP 173/82 | HR 73 | Ht 64.0 in | Wt 158.0 lb

## 2024-03-29 DIAGNOSIS — G500 Trigeminal neuralgia: Secondary | ICD-10-CM | POA: Diagnosis not present

## 2024-03-29 DIAGNOSIS — G44099 Other trigeminal autonomic cephalgias (TAC), not intractable: Secondary | ICD-10-CM

## 2024-03-29 MED ORDER — CARBAMAZEPINE 200 MG PO TABS
300.0000 mg | ORAL_TABLET | Freq: Three times a day (TID) | ORAL | 3 refills | Status: DC
  Filled 2024-03-29: qty 135, 30d supply, fill #0
  Filled 2024-05-22: qty 135, 30d supply, fill #1

## 2024-03-29 NOTE — Patient Instructions (Signed)
 Continue gabapentin  600mg  three times daily Increase carbamazepine  to 200mg  (1 tablet) three times daily x 1 week, then increase to carbamazepine  300mg  (1.5 tablet) three times daily Call or send MyChart update in 2-3 weeks

## 2024-03-31 ENCOUNTER — Other Ambulatory Visit: Payer: Self-pay

## 2024-03-31 ENCOUNTER — Telehealth (INDEPENDENT_AMBULATORY_CARE_PROVIDER_SITE_OTHER): Admitting: Family Medicine

## 2024-03-31 DIAGNOSIS — I10 Essential (primary) hypertension: Secondary | ICD-10-CM

## 2024-03-31 NOTE — Progress Notes (Signed)
 Pt joined call with a family member however they did not do any VS/BP readings and they were out at a funeral. Asked that they reschedule the appt for BP recheck as it is uncontrolled and new medications were started They will need to be at home with BP readings ready to review or come into the office for the f/up  Pt also asked a lot of questions about his ongoing facial pain and we explained that the pt is now being managed by the specialist (neurology) so I do not jump in and change meds or address as it is out of my scope and experience and it is a process with the specialist to get sx assessed, dx and managed I reviewed recent OV with neurology and med doses were adjusted and looks like they are continuing to titrate meds to get sx better controlled   Adeline Hone, PA-C

## 2024-04-07 ENCOUNTER — Ambulatory Visit: Payer: Self-pay | Admitting: Neurology

## 2024-04-14 ENCOUNTER — Ambulatory Visit: Payer: Self-pay | Admitting: Nurse Practitioner

## 2024-04-14 ENCOUNTER — Telehealth: Payer: Self-pay | Admitting: Neurology

## 2024-04-14 ENCOUNTER — Other Ambulatory Visit: Payer: Self-pay

## 2024-04-14 NOTE — Telephone Encounter (Signed)
 Called and spoke to patients daughter Daniel Haynes and she stated that the Carbamezapine is causing the patient to have more imbalance which is affecting his standing and walking. Patients daughter aware that I will get a message sent to Dr. Tobie and give them a call back.

## 2024-04-14 NOTE — Telephone Encounter (Signed)
 PT. States he is having issues with med and he is having an imbalance feeling with his ADLs and walking and standing, which is starting to affect his job.Pls cl bk

## 2024-04-17 NOTE — Telephone Encounter (Signed)
 Called patient and left a message for a call back.

## 2024-04-17 NOTE — Telephone Encounter (Signed)
 I'm sorry to hear this.  Please recommend that he go back to taking carbamazepine  200mg  twice daily.  Once his side effects have improved, please tell him to call us  back and we can try an alternative medication, oxcarbamazepine which is similar but can have less side effects.

## 2024-04-17 NOTE — Telephone Encounter (Signed)
 Pt.s sister calling back, pls call back

## 2024-04-18 ENCOUNTER — Ambulatory Visit: Admitting: Neurology

## 2024-04-19 ENCOUNTER — Other Ambulatory Visit: Payer: Self-pay

## 2024-04-19 NOTE — Telephone Encounter (Signed)
 Called and spoke to patients daughter Nena and informed her of Dr. Anthony recommendations. Patients sister stated she will let patient know recommendations and will contact us  once he is read to try new medication. Patients sister verbalized understanding and had no further questions or concerns.

## 2024-05-10 ENCOUNTER — Encounter: Payer: Self-pay | Admitting: Neurology

## 2024-05-10 ENCOUNTER — Ambulatory Visit: Admitting: Neurology

## 2024-05-10 DIAGNOSIS — Z029 Encounter for administrative examinations, unspecified: Secondary | ICD-10-CM

## 2024-05-18 ENCOUNTER — Other Ambulatory Visit: Payer: Self-pay

## 2024-05-19 ENCOUNTER — Other Ambulatory Visit: Payer: Self-pay

## 2024-05-22 ENCOUNTER — Ambulatory Visit (INDEPENDENT_AMBULATORY_CARE_PROVIDER_SITE_OTHER): Admitting: Neurology

## 2024-05-22 ENCOUNTER — Encounter: Payer: Self-pay | Admitting: Neurology

## 2024-05-22 ENCOUNTER — Other Ambulatory Visit: Payer: Self-pay

## 2024-05-22 VITALS — BP 194/88 | HR 60 | Ht 65.0 in | Wt 157.0 lb

## 2024-05-22 DIAGNOSIS — G500 Trigeminal neuralgia: Secondary | ICD-10-CM

## 2024-05-22 DIAGNOSIS — G44099 Other trigeminal autonomic cephalgias (TAC), not intractable: Secondary | ICD-10-CM

## 2024-05-22 MED ORDER — OXCARBAZEPINE 150 MG PO TABS
150.0000 mg | ORAL_TABLET | Freq: Two times a day (BID) | ORAL | 5 refills | Status: DC
  Filled 2024-05-22: qty 60, 30d supply, fill #0
  Filled 2024-07-11: qty 60, 30d supply, fill #1

## 2024-05-22 NOTE — Progress Notes (Signed)
 Follow-up Visit   Date: 05/22/2024    Daniel Haynes MRN: [id number] DOB: 1951-05-25    Daniel Haynes is a 73 y.o. right-handed  male with hypertension, hyperlipidemia, BPH, and gout returning to the clinic for follow-up of right facial pain.  The patient was accompanied to the clinic by self.   IMPRESSION/PLAN: Refractory atypical right facial pain suggestive of trigeminal neuralgia vs. Trigeminal autonomic cephalagia .  MRI brain shows right parafalcine meningioma, which would not be causing his facial pain.  MRA with severe right vertebral stenosis.   - Start oxcarbamazepine 150mg  twice daily in hope to be able to titrate this higher.  He did not tolerate higher dose of carbamazepine   - Stop carbamazepine    - Reduce gabapentin  to 600mg  twice daily to see if this will help unsteadiness  - Going forward consider lamotrigine, pregabalin , or duloxetine  2.   Elevated blood pressure. BP rechecked and remains elevated.  He is asymptomatic and reports to not taking his BP medications today. Encouraged to monitor at home and follow-up with PCP  Return to clinic in 8 weeks  --------------------------------------------- History of present illness: Starting around January 2025, he started having right facial pain described as throbbing, which starts in the the right cheek and radiates towards the jaw and ear.  Pain lasts about 5-6 seconds and occurs several times per hour.  Chewing, talking, any physical activity can trigger the pain.  He does not have sharp, stabbing pain.  The pain also bothers him at night.  He saw his dentist who extracted a tooth, but there was no change.  He has noticed associated tearing and feels as if he has a post-nasal drop on the right side.  He also has fullness of the right cheek when his pain occurs. He stakes gabapentin  600mg  three times daily, which does not provide any relief.  He was on carbamazepine  100mg  BID and notes suggest there was some improvement, but he  does not recall this.    He smokes cigarettes.  He drinks moderate alcohol, 2-3 times per week.  He work in [employer].    UPDATE 03/28/2024:   He is here for follow-up visit.  He takes carbamazepine  200mg  BID and gabapentin  600mg  TID.  He reports that the pain is slightly better with reduced intensity and frequency, but it has not changed his quality of life.  Pain continues to be daily and occurs multiple times per day.  When present, it is brief for 5-10 seconds, but recurs.  It interferes with his ability to talk, chew, eat, and sleep.    UPDATE 05/22/2024:  He is here for follow-up visit.  He did not tolerate higher dose of carbamazepine  due to dizziness and back to taking 200mg  twice daily.  He also takes gabapentin  600mg  TID however does not report much relief and feels that it makes him unsteady.  Unfortunately, he continues to have significant right facial pain.   Medications:  Current Outpatient Medications on File Prior to Visit  Medication Sig Dispense Refill   amLODipine  (NORVASC ) 10 MG tablet Take 1 tablet (10 mg total) by mouth daily. 90 tablet 1   carbamazepine  (TEGRETOL ) 200 MG tablet Take 1.5 tablets (300 mg total) by mouth 3 (three) times daily. 135 tablet 3   hydrochlorothiazide  (HYDRODIURIL ) 25 MG tablet Take 1 tablet (25 mg total) by mouth daily. 90 tablet 1   lisinopril  (ZESTRIL ) 40 MG tablet Take 1 tablet (40 mg total) by mouth daily. 90 tablet 1  pravastatin  (PRAVACHOL ) 40 MG tablet Take 40 mg by mouth daily.     rosuvastatin  (CRESTOR ) 10 MG tablet Take 1 tablet (10 mg total) by mouth daily. 90 tablet 1   tamsulosin  (FLOMAX ) 0.4 MG CAPS capsule Take 1 capsule (0.4 mg total) by mouth daily. 90 capsule 1   traZODone  (DESYREL ) 50 MG tablet Take 1 tablet (50 mg total) by mouth at bedtime as needed for sleep. 90 tablet 1   colchicine  0.6 MG tablet TAKE 2 TABLETS (1.2MG ) BY MOUTH AT FIRST SIGN OF FLARE,FOLLOWED BY 1 TABLET (0.6MG ) AFTER 1 HOUR (Patient not taking: Reported  on 05/22/2024) 30 tablet 5   finasteride  (PROSCAR ) 5 MG tablet Take 1 tablet (5 mg total) by mouth daily. 90 tablet 1   gabapentin  (NEURONTIN ) 300 MG capsule Take 1-2 capsules (300-600 mg total) by mouth 3 (three) times daily. (Patient taking differently: Take 300-600 mg by mouth 3 (three) times daily. Take two capsules three times a day) 270 capsule 1   No current facility-administered medications on file prior to visit.    Allergies: No Known Allergies  Vital Signs:  BP (!) 178/94 (BP Location: Left Arm, Patient Position: Sitting, Cuff Size: Normal)   Pulse 60   Ht 5' 5 (1.651 m)   Wt 157 lb (71.2 kg)   SpO2 99%   BMI 26.13 kg/m    Neurological Exam: MENTAL STATUS including orientation to time, place, person, recent and remote memory, attention span and concentration, language, and fund of knowledge is normal.  Speech is not dysarthric.  CRANIAL NERVES:    Pupils equal round and reactive to light.  Normal conjugate, extra-ocular eye movements in all directions of gaze.  No ptosis.  Face is symmetric.  MOTOR:  Motor strength is 5/5 in all extremities.  No atrophy, fasciculations or abnormal movements.  No pronator drift.  Tone is normal.    COORDINATION/GAIT:  Gait narrow based and stable.   Data: MRI brain wo contrast 01/29/2024: No specific cause for headache. Suspect a 13 x 5 mm right parafalcine meningioma. Could obtain postcontrast imaging for further assessment and follow-up purposes. Presumed chronic small vessel ischemia in the cerebral white matter.  MRA head wo contrast 04/07/2024: Marked stenosis of the right vertebral artery just proximal to the vertebrobasilar junction. Otherwise unremarkable.   Thank you for allowing me to participate in patient's care.  If I can answer any additional questions, I would be pleased to do so.    Sincerely,    Avinash Maltos K. Tobie, DO

## 2024-05-22 NOTE — Patient Instructions (Signed)
 Stop carbamazepine . Start oxcarbazmepine 150mg  twice daily Reduce gabapentin  to 600mg  twice daily.  If your pain gets worse, go back to taking 600mg  three times daily

## 2024-05-26 ENCOUNTER — Encounter: Payer: Self-pay | Admitting: Family Medicine

## 2024-05-26 ENCOUNTER — Other Ambulatory Visit: Payer: Self-pay

## 2024-05-26 ENCOUNTER — Ambulatory Visit (INDEPENDENT_AMBULATORY_CARE_PROVIDER_SITE_OTHER): Admitting: Family Medicine

## 2024-05-26 VITALS — BP 136/70 | HR 75 | Resp 16 | Ht 65.0 in | Wt 157.0 lb

## 2024-05-26 DIAGNOSIS — R350 Frequency of micturition: Secondary | ICD-10-CM | POA: Diagnosis not present

## 2024-05-26 DIAGNOSIS — F17200 Nicotine dependence, unspecified, uncomplicated: Secondary | ICD-10-CM

## 2024-05-26 DIAGNOSIS — I10 Essential (primary) hypertension: Secondary | ICD-10-CM | POA: Diagnosis not present

## 2024-05-26 DIAGNOSIS — N1831 Chronic kidney disease, stage 3a: Secondary | ICD-10-CM | POA: Diagnosis not present

## 2024-05-26 DIAGNOSIS — E785 Hyperlipidemia, unspecified: Secondary | ICD-10-CM

## 2024-05-26 DIAGNOSIS — N401 Enlarged prostate with lower urinary tract symptoms: Secondary | ICD-10-CM

## 2024-05-26 DIAGNOSIS — R351 Nocturia: Secondary | ICD-10-CM

## 2024-05-26 DIAGNOSIS — M109 Gout, unspecified: Secondary | ICD-10-CM | POA: Diagnosis not present

## 2024-05-26 DIAGNOSIS — D649 Anemia, unspecified: Secondary | ICD-10-CM

## 2024-05-26 MED ORDER — TAMSULOSIN HCL 0.4 MG PO CAPS
0.8000 mg | ORAL_CAPSULE | Freq: Every day | ORAL | 1 refills | Status: AC
  Filled 2024-05-26 – 2024-09-01 (×2): qty 180, 90d supply, fill #0

## 2024-05-26 NOTE — Progress Notes (Signed)
 Name: Shubh Chiara   MRN: [id number]    DOB: 29-Mar-1951   Date:05/26/2024       Progress Note  Chief Complaint  Patient presents with   Medical Management of Chronic Issues   Hypertension   Hyperlipidemia     Subjective:   Harland Hackbart is a 73 y.o. male, presents to clinic for routine follow up on chronic conditions Pt recently est care mostly related to acute pain issues and has since seen neurology who is managing  Here today for f/up on other chronic conditions and f/up on labs  HTN poorly controlled also exacerbated by pain, much better today on his current meds amlodipine  10 &HCTZ 25 lisinopril  40 BP Readings from Last 3 Encounters:  05/26/24 136/70  05/22/24 (!) 194/88  03/29/24 (!) 173/82   Poorly controlled HLD changed from pravastatin  to crestor  but the pt had not gotten medication filled and last Rx was pravastatin  #30 in May  - so likely out of meds and off statin Lab Results  Component Value Date   CHOL 201 (H) 02/24/2024   HDL 59 02/24/2024   LDLCALC 120 (H) 02/24/2024   TRIG 113 02/24/2024   CHOLHDL 3.4 02/24/2024     Mild anemia Hemoglobin  Date Value Ref Range Status  02/24/2024 12.6 (L) 13.2 - 17.1 g/dL Final  96/78/7974 87.6 (L) 13.2 - 17.1 g/dL Final  98/92/7974 85.3 13.0 - 17.7 g/dL Final  89/89/7977 88.3 (L) 13.0 - 17.7 g/dL Final  96/92/7977 87.5 (L) 13.0 - 17.7 g/dL Final  91/83/7978 86.7 13.0 - 17.7 g/dL Final    BPH on flomax  and proscar        Current Outpatient Medications:    amLODipine  (NORVASC ) 10 MG tablet, Take 1 tablet (10 mg total) by mouth daily., Disp: 90 tablet, Rfl: 1   finasteride  (PROSCAR ) 5 MG tablet, Take 1 tablet (5 mg total) by mouth daily., Disp: 90 tablet, Rfl: 1   gabapentin  (NEURONTIN ) 300 MG capsule, Take 1-2 capsules (300-600 mg total) by mouth 3 (three) times daily. (Patient taking differently: Take 300-600 mg by mouth 3 (three) times daily. Take two capsules three times a day), Disp: 270 capsule, Rfl: 1    hydrochlorothiazide  (HYDRODIURIL ) 25 MG tablet, Take 1 tablet (25 mg total) by mouth daily., Disp: 90 tablet, Rfl: 1   lisinopril  (ZESTRIL ) 40 MG tablet, Take 1 tablet (40 mg total) by mouth daily., Disp: 90 tablet, Rfl: 1   OXcarbazepine  (TRILEPTAL ) 150 MG tablet, Take 1 tablet (150 mg total) by mouth 2 (two) times daily., Disp: 60 tablet, Rfl: 5   rosuvastatin  (CRESTOR ) 10 MG tablet, Take 1 tablet (10 mg total) by mouth daily., Disp: 90 tablet, Rfl: 1   tamsulosin  (FLOMAX ) 0.4 MG CAPS capsule, Take 1 capsule (0.4 mg total) by mouth daily., Disp: 90 capsule, Rfl: 1   traZODone  (DESYREL ) 50 MG tablet, Take 1 tablet (50 mg total) by mouth at bedtime as needed for sleep., Disp: 90 tablet, Rfl: 1   colchicine  0.6 MG tablet, TAKE 2 TABLETS (1.2MG ) BY MOUTH AT FIRST SIGN OF FLARE,FOLLOWED BY 1 TABLET (0.6MG ) AFTER 1 HOUR (Patient not taking: Reported on 05/22/2024), Disp: 30 tablet, Rfl: 5  Patient Active Problem List   Diagnosis Date Noted   Anemia 02/24/2024   Facial pain 12/10/2023   Tobacco use disorder 12/10/2023   Encounter for examination following treatment at hospital 12/10/2023   Pain, dental 10/26/2023   Pain in both hands 10/26/2023   Insomnia due to medical condition 10/26/2023  Benign prostatic hyperplasia with urinary frequency 10/26/2023   Hyperlipidemia 10/26/2023   Essential hypertension 10/26/2023   Need for influenza vaccination 10/26/2023   Paresthesia 06/08/2022   Chronic kidney disease (CKD) stage G3a/A1, moderately decreased glomerular filtration rate (GFR) between 45-59 mL/min/1.73 square meter and albuminuria creatinine ratio less than 30 mg/g (HCC) 06/06/2020   Urinary frequency 07/31/2017   Accelerated hypertension 02/17/2016   Frequency of urination 02/17/2016   Gout 02/17/2016    Past Surgical History:  Procedure Laterality Date   CERVICAL DISCECTOMY  10/19/2002   ACDF C3 to C6 [facility]    Family History  Problem Relation Age of Onset   Cancer Mother     Hypertension Mother    Cancer Father    Alcohol abuse Father    Arthritis Sister    Hypertension Sister    Diabetes Sister    Hypertension Sister    Kidney disease Sister    Vision loss Sister    Drug abuse Brother    Colon cancer Neg Hx    Colon polyps Neg Hx    Esophageal cancer Neg Hx    Rectal cancer Neg Hx    Stomach cancer Neg Hx     Social History   Tobacco Use   Smoking status: Some Days    Current packs/day: 0.25    Types: Cigarettes   Smokeless tobacco: Never   Tobacco comments:    2 cigs a week, was previously quit, restarted since facial pain early 2025  Vaping Use   Vaping status: Never Used  Substance Use Topics   Alcohol use: Yes    Alcohol/week: 10.0 standard drinks of alcohol    Types: 10 Shots of liquor per week    Comment: Occasional Drink   Drug use: Yes    Types: Marijuana    Comment: occ use      No Known Allergies  Health Maintenance  Topic Date Due   Medicare Annual Wellness (AWV)  Never done   Zoster Vaccines- Shingrix (1 of 2) 05/26/2024 (Originally 02/26/2001)   COVID-19 Vaccine (6 - 2024-25 season) 06/10/2024 (Originally 06/20/2023)   INFLUENZA VACCINE  01/16/2025 (Originally 05/19/2024)   Colonoscopy  08/21/2026   DTaP/Tdap/Td (2 - Td or Tdap) 12/29/2026   Pneumococcal Vaccine: 50+ Years  Completed   Hepatitis C Screening  Completed   Hepatitis B Vaccines  Aged Out   HPV VACCINES  Aged Out   Meningococcal B Vaccine  Aged Out    Chart Review Today: I personally reviewed active problem list, medication list, allergies, family history, social history, health maintenance, notes from last encounter, lab results, imaging with the patient/caregiver today.   Review of Systems  Constitutional: Negative.   HENT: Negative.    Eyes: Negative.   Respiratory: Negative.    Cardiovascular: Negative.   Gastrointestinal: Negative.   Endocrine: Negative.   Genitourinary: Negative.   Musculoskeletal: Negative.   Skin: Negative.    Allergic/Immunologic: Negative.   Neurological: Negative.   Hematological: Negative.   Psychiatric/Behavioral: Negative.    All other systems reviewed and are negative.    Objective:   Vitals:   05/26/24 1342 05/26/24 1350  BP: (!) 142/76 136/70  Pulse: 75   Resp: 16   SpO2: 98%   Weight: 157 lb (71.2 kg)   Height: 5' 5 (1.651 m)     Body mass index is 26.13 kg/m.  Physical Exam Vitals and nursing note reviewed.  Constitutional:      General: He is not in acute  distress.    Appearance: Normal appearance. He is well-developed. He is not ill-appearing, toxic-appearing or diaphoretic.  HENT:     Head: Normocephalic and atraumatic.     Right Ear: External ear normal.     Left Ear: External ear normal.     Nose: Nose normal.  Eyes:     General: No scleral icterus.       Right eye: No discharge.        Left eye: No discharge.     Conjunctiva/sclera: Conjunctivae normal.  Neck:     Trachea: No tracheal deviation.  Cardiovascular:     Rate and Rhythm: Normal rate and regular rhythm.     Pulses: Normal pulses.     Heart sounds: Normal heart sounds. No murmur heard.    No gallop.  Pulmonary:     Effort: Pulmonary effort is normal. No respiratory distress.     Breath sounds: Normal breath sounds. No stridor. No wheezing, rhonchi or rales.  Musculoskeletal:     Right lower leg: No edema.     Left lower leg: No edema.  Skin:    General: Skin is warm and dry.     Findings: No rash.  Neurological:     Mental Status: He is alert.     Motor: No abnormal muscle tone.     Coordination: Coordination normal.     Gait: Gait normal.  Psychiatric:        Mood and Affect: Mood normal.        Behavior: Behavior normal.        Results for orders placed or performed in visit on 02/24/24  Comprehensive metabolic panel with GFR   Collection Time: 02/24/24 11:36 AM  Result Value Ref Range   Glucose, Bld 85 65 - 99 mg/dL   BUN 20 7 - 25 mg/dL   Creat 8.84 9.29 - 8.71 mg/dL    eGFR 68 > OR = 60 fO/fpw/8.26f7   BUN/Creatinine Ratio SEE NOTE: 6 - 22 (calc)   Sodium 143 135 - 146 mmol/L   Potassium 4.4 3.5 - 5.3 mmol/L   Chloride 105 98 - 110 mmol/L   CO2 30 20 - 32 mmol/L   Calcium  9.7 8.6 - 10.3 mg/dL   Total Protein 7.3 6.1 - 8.1 g/dL   Albumin 4.6 3.6 - 5.1 g/dL   Globulin 2.7 1.9 - 3.7 g/dL (calc)   AG Ratio 1.7 1.0 - 2.5 (calc)   Total Bilirubin 0.7 0.2 - 1.2 mg/dL   Alkaline phosphatase (APISO) 78 35 - 144 U/L   AST 14 10 - 35 U/L   ALT 10 9 - 46 U/L  Lipid panel   Collection Time: 02/24/24 11:36 AM  Result Value Ref Range   Cholesterol 201 (H) <200 mg/dL   HDL 59 > OR = 40 mg/dL   Triglycerides 886 <849 mg/dL   LDL Cholesterol (Calc) 120 (H) mg/dL (calc)   Total CHOL/HDL Ratio 3.4 <5.0 (calc)   Non-HDL Cholesterol (Calc) 142 (H) <130 mg/dL (calc)  CBC with Differential/Platelet   Collection Time: 02/24/24 11:36 AM  Result Value Ref Range   WBC 5.8 3.8 - 10.8 Thousand/uL   RBC 4.07 (L) 4.20 - 5.80 Million/uL   Hemoglobin 12.6 (L) 13.2 - 17.1 g/dL   HCT 62.7 (L) 61.4 - 49.9 %   MCV 91.4 80.0 - 100.0 fL   MCH 31.0 27.0 - 33.0 pg   MCHC 33.9 32.0 - 36.0 g/dL   RDW 86.7 88.9 - 84.9 %  Platelets 296 140 - 400 Thousand/uL   MPV 9.7 7.5 - 12.5 fL   Neutro Abs 3,144 1,500 - 7,800 cells/uL   Absolute Lymphocytes 1,746 850 - 3,900 cells/uL   Absolute Monocytes 626 200 - 950 cells/uL   Eosinophils Absolute 267 15 - 500 cells/uL   Basophils Absolute 17 0 - 200 cells/uL   Neutrophils Relative % 54.2 %   Total Lymphocyte 30.1 %   Monocytes Relative 10.8 %   Eosinophils Relative 4.6 %   Basophils Relative 0.3 %      Assessment & Plan:   Tobacco use disorder Assessment & Plan: Encouraged continued efforts to cut back and quit   Essential hypertension Assessment & Plan: Significantly improved blood pressure control with amlodipine  10 mg, lisinopril  40 and hydrochlorothiazide  25 mg BP Readings from Last 3 Encounters:  05/26/24 136/70   05/22/24 (!) 194/88  03/29/24 (!) 173/82  Continue same medications and doses    Hyperlipidemia, unspecified hyperlipidemia type Assessment & Plan: Poor med compliance and understanding of prior plan despite spending significant amount of time with the patient and his family member at the last office visit clarifying how we will communicate with him about lab results, follow-up testing referrals and treatment changes He has not filled or been taking his prior statin plan was with poor control and pravastatin  to change him to Crestor  and recheck labs today.  He was given information about the new medication again as well as information of what pharmacy to get at instructed to take it daily and follow-up in 3 to 4 months for recheck of labs and to ensure he is tolerating Lab Results  Component Value Date   CHOL 201 (H) 02/24/2024   HDL 59 02/24/2024   LDLCALC 120 (H) 02/24/2024   TRIG 113 02/24/2024   CHOLHDL 3.4 02/24/2024      Chronic kidney disease (CKD) stage G3a/A1, moderately decreased glomerular filtration rate (GFR) between 45-59 mL/min/1.73 square meter and albuminuria creatinine ratio less than 30 mg/g (HCC) Assessment & Plan: Last labs showed improved eGFR to 68, sCr 1.15 Improved BP control  Will continue to monitor renal function anticipate CMP again with next OV/labs   Gout, unspecified cause, unspecified chronicity, unspecified site Assessment & Plan: No recent flares, on colchicine  as needed for flares    Accelerated hypertension Assessment & Plan: Improving with current meds/management   Benign prostatic hyperplasia with urinary frequency Assessment & Plan: Pt on multiple meds with prior elevated PSA does not seem like he previously consulted with urology and his sx do not seem well controlled   Orders: -     Tamsulosin  HCl; Take 2 capsules (0.8 mg total) by mouth at bedtime.  Dispense: 180 capsule; Refill: 1 -     Ambulatory referral to  Urology  Anemia, unspecified type Assessment & Plan: Hemoglobin  Date Value Ref Range Status  02/24/2024 12.6 (L) 13.2 - 17.1 g/dL Final  96/78/7974 87.6 (L) 13.2 - 17.1 g/dL Final  98/92/7974 85.3 13.0 - 17.7 g/dL Final  89/89/7977 88.3 (L) 13.0 - 17.7 g/dL Final  96/92/7977 87.5 (L) 13.0 - 17.7 g/dL Final  91/83/7978 86.7 13.0 - 17.7 g/dL Final  Mild anemia seems stable     Nocturia -     Ambulatory referral to Urology      Return in about 3 months (around 08/26/2024) for Routine follow-up med changes and f/up lipids cmp.   Michelene Cower, PA-C 05/26/24 2:05 PM

## 2024-05-26 NOTE — Patient Instructions (Addendum)
 Increase your flomax  dose at bedtime and see if that helps with urinary symptoms. Look at the handouts and follow up with urology   You should be on crestor  once daily and I stopped pravastatin  in May Your cholesterol was too high and poorly controlled Start crestor  and return in 3-4 months for follow up and recheck of labs

## 2024-06-02 ENCOUNTER — Other Ambulatory Visit: Payer: Self-pay

## 2024-06-15 ENCOUNTER — Encounter: Payer: Self-pay | Admitting: Family Medicine

## 2024-06-15 NOTE — Assessment & Plan Note (Signed)
 Hemoglobin  Date Value Ref Range Status  02/24/2024 12.6 (L) 13.2 - 17.1 g/dL Final  96/78/7974 87.6 (L) 13.2 - 17.1 g/dL Final  98/92/7974 85.3 13.0 - 17.7 g/dL Final  89/89/7977 88.3 (L) 13.0 - 17.7 g/dL Final  96/92/7977 87.5 (L) 13.0 - 17.7 g/dL Final  91/83/7978 86.7 13.0 - 17.7 g/dL Final  Mild anemia seems stable

## 2024-06-15 NOTE — Assessment & Plan Note (Signed)
 Significantly improved blood pressure control with amlodipine  10 mg, lisinopril  40 and hydrochlorothiazide  25 mg BP Readings from Last 3 Encounters:  05/26/24 136/70  05/22/24 (!) 194/88  03/29/24 (!) 173/82  Continue same medications and doses

## 2024-06-15 NOTE — Assessment & Plan Note (Signed)
 Encouraged continued efforts to cut back and quit

## 2024-06-15 NOTE — Assessment & Plan Note (Signed)
 Pt on multiple meds with prior elevated PSA does not seem like he previously consulted with urology and his sx do not seem well controlled

## 2024-06-15 NOTE — Assessment & Plan Note (Signed)
 Poor med compliance and understanding of prior plan despite spending significant amount of time with the patient and his family member at the last office visit clarifying how we will communicate with him about lab results, follow-up testing referrals and treatment changes He has not filled or been taking his prior statin plan was with poor control and pravastatin  to change him to Crestor  and recheck labs today.  He was given information about the new medication again as well as information of what pharmacy to get at instructed to take it daily and follow-up in 3 to 4 months for recheck of labs and to ensure he is tolerating Lab Results  Component Value Date   CHOL 201 (H) 02/24/2024   HDL 59 02/24/2024   LDLCALC 120 (H) 02/24/2024   TRIG 113 02/24/2024   CHOLHDL 3.4 02/24/2024

## 2024-06-15 NOTE — Assessment & Plan Note (Signed)
 No recent flares, on colchicine  as needed for flares

## 2024-06-15 NOTE — Assessment & Plan Note (Signed)
 Last labs showed improved eGFR to 68, sCr 1.15 Improved BP control  Will continue to monitor renal function anticipate CMP again with next OV/labs

## 2024-06-15 NOTE — Assessment & Plan Note (Signed)
 Improving with current meds/management

## 2024-06-25 ENCOUNTER — Encounter: Payer: Self-pay | Admitting: Family Medicine

## 2024-07-13 ENCOUNTER — Other Ambulatory Visit: Payer: Self-pay

## 2024-07-14 DIAGNOSIS — N311 Reflex neuropathic bladder, not elsewhere classified: Secondary | ICD-10-CM | POA: Diagnosis not present

## 2024-07-14 DIAGNOSIS — N401 Enlarged prostate with lower urinary tract symptoms: Secondary | ICD-10-CM | POA: Diagnosis not present

## 2024-07-14 DIAGNOSIS — R350 Frequency of micturition: Secondary | ICD-10-CM | POA: Diagnosis not present

## 2024-07-14 DIAGNOSIS — R8271 Bacteriuria: Secondary | ICD-10-CM | POA: Diagnosis not present

## 2024-08-01 ENCOUNTER — Other Ambulatory Visit: Payer: Self-pay

## 2024-08-01 ENCOUNTER — Encounter: Payer: Self-pay | Admitting: Neurology

## 2024-08-01 ENCOUNTER — Ambulatory Visit: Admitting: Neurology

## 2024-08-01 VITALS — BP 123/74 | HR 62 | Resp 20 | Ht 65.0 in | Wt 157.0 lb

## 2024-08-01 DIAGNOSIS — G500 Trigeminal neuralgia: Secondary | ICD-10-CM

## 2024-08-01 MED ORDER — OXCARBAZEPINE 150 MG PO TABS
300.0000 mg | ORAL_TABLET | Freq: Two times a day (BID) | ORAL | 5 refills | Status: AC
  Filled 2024-08-01: qty 120, 30d supply, fill #0
  Filled 2024-09-01: qty 120, 30d supply, fill #1
  Filled 2024-11-16: qty 120, 30d supply, fill #2

## 2024-08-01 NOTE — Progress Notes (Signed)
 Follow-up Visit   Date: 08/01/2024    Daniel Haynes MRN: [id number] DOB: [birth year]    Daniel Haynes is a 73 y.o. right-handed  male with hypertension, hyperlipidemia, BPH, and gout returning to the clinic for follow-up of right facial pain.  The patient was accompanied to the clinic by self.   IMPRESSION/PLAN: Atypical right facial pain, mild improvement.  Likely, trigeminal neuralgia.  MRI brain shows right parafalcine meningioma, which would not be causing his facial pain.  MRA with severe right vertebral stenosis.  - Previously tried:  carbamazepine  (unable to titrate due to side effects)  - Continue oxcarbazepine  300mg  twice daily  - He would like to taper off gabapentin , recommend that he reduce gabapentin  to 1 tablet at bedtime x 3 days, then stop.  - Going forward consider lamotrigine, pregabalin , or duloxetine  Return to clinic in 2-3 months  --------------------------------------------- History of present illness: Starting around January 2025, he started having right facial pain described as throbbing, which starts in the the right cheek and radiates towards the jaw and ear.  Pain lasts about 5-6 seconds and occurs several times per hour.  Chewing, talking, any physical activity can trigger the pain.  He does not have sharp, stabbing pain.  The pain also bothers him at night.  He saw his dentist who extracted a tooth, but there was no change.  He has noticed associated tearing and feels as if he has a post-nasal drop on the right side.  He also has fullness of the right cheek when his pain occurs. He stakes gabapentin  600mg  three times daily, which does not provide any relief.  He was on carbamazepine  100mg  BID and notes suggest there was some improvement, but he does not recall this.    He smokes cigarettes.  He drinks moderate alcohol, 2-3 times per week.  He work in [employer].    UPDATE 03/28/2024:   He is here for follow-up visit.  He takes carbamazepine  200mg  BID  and gabapentin  600mg  TID.  He reports that the pain is slightly better with reduced intensity and frequency, but it has not changed his quality of life.  Pain continues to be daily and occurs multiple times per day.  When present, it is brief for 5-10 seconds, but recurs.  It interferes with his ability to talk, chew, eat, and sleep.    UPDATE 05/22/2024:  He is here for follow-up visit.  He did not tolerate higher dose of carbamazepine  due to dizziness and back to taking 200mg  twice daily.  He also takes gabapentin  600mg  TID however does not report much relief and feels that it makes him unsteady.  Unfortunately, he continues to have significant right facial pain.   UPDATE 08/01/2024:  He is here for follow-up visit.  He has noticed that pain is slightly improved on oxcarbazepine .  Now, he has spells of pain about 2-3 times per hour, where as previously it was constant.  He also does not have fullness or swelling as much.  He self titrated this to 300mg  twice daily a few weeks ago and did initially have increased dizziness, but this has improved over the last week.  He would like to try to stop gabapentin  which he is taking 300mg  twice daily.    Medications:  Current Outpatient Medications on File Prior to Visit  Medication Sig Dispense Refill   amLODipine  (NORVASC ) 10 MG tablet Take 1 tablet (10 mg total) by mouth daily. 90 tablet 1   colchicine  0.6 MG  tablet TAKE 2 TABLETS (1.2MG ) BY MOUTH AT FIRST SIGN OF FLARE,FOLLOWED BY 1 TABLET (0.6MG ) AFTER 1 HOUR (Patient not taking: Reported on 05/22/2024) 30 tablet 5   finasteride  (PROSCAR ) 5 MG tablet Take 1 tablet (5 mg total) by mouth daily. 90 tablet 1   gabapentin  (NEURONTIN ) 300 MG capsule Take 1-2 capsules (300-600 mg total) by mouth 3 (three) times daily. (Patient taking differently: Take 300-600 mg by mouth 3 (three) times daily. Take two capsules three times a day) 270 capsule 1   hydrochlorothiazide  (HYDRODIURIL ) 25 MG tablet Take 1 tablet (25 mg  total) by mouth daily. 90 tablet 1   lisinopril  (ZESTRIL ) 40 MG tablet Take 1 tablet (40 mg total) by mouth daily. 90 tablet 1   OXcarbazepine  (TRILEPTAL ) 150 MG tablet Take 1 tablet (150 mg total) by mouth 2 (two) times daily. 60 tablet 5   rosuvastatin  (CRESTOR ) 10 MG tablet Take 1 tablet (10 mg total) by mouth daily. 90 tablet 1   tamsulosin  (FLOMAX ) 0.4 MG CAPS capsule Take 2 capsules (0.8 mg total) by mouth at bedtime. 180 capsule 1   traZODone  (DESYREL ) 50 MG tablet Take 1 tablet (50 mg total) by mouth at bedtime as needed for sleep. 90 tablet 1   No current facility-administered medications on file prior to visit.    Allergies: No Known Allergies  Vital Signs:  BP 123/74   Pulse 62   Resp 20   Ht 5' 5 (1.651 m)   Wt 157 lb (71.2 kg)   SpO2 99%   BMI 26.13 kg/m    Neurological Exam: MENTAL STATUS including orientation to time, place, person, recent and remote memory, attention span and concentration, language, and fund of knowledge is normal.  Speech is not dysarthric.  CRANIAL NERVES:    Pupils equal round and reactive to light.  Normal conjugate, extra-ocular eye movements in all directions of gaze.  No ptosis.  Face is symmetric.  MOTOR:  Motor strength is 5/5 in all extremities.  No atrophy, fasciculations or abnormal movements.  No pronator drift.  Tone is normal.    COORDINATION/GAIT:  Gait narrow based and stable.   Data: MRI brain wo contrast 01/29/2024: No specific cause for headache. Suspect a 13 x 5 mm right parafalcine meningioma. Could obtain postcontrast imaging for further assessment and follow-up purposes. Presumed chronic small vessel ischemia in the cerebral white matter.  MRA head wo contrast 04/07/2024: Marked stenosis of the right vertebral artery just proximal to the vertebrobasilar junction. Otherwise unremarkable.   Thank you for allowing me to participate in patient's care.  If I can answer any additional questions, I would be pleased to do so.     Sincerely,    Abimelec Grochowski K. Tobie, DO

## 2024-08-01 NOTE — Patient Instructions (Addendum)
 Reduce gabapentin  300mg  at bedtime x 3 days, then stop.  Continue oxcarbazepine  300mg  twice daily

## 2024-08-08 ENCOUNTER — Other Ambulatory Visit: Payer: Self-pay

## 2024-08-28 ENCOUNTER — Other Ambulatory Visit: Payer: Self-pay

## 2024-08-28 ENCOUNTER — Other Ambulatory Visit: Payer: Self-pay | Admitting: Family Medicine

## 2024-08-28 DIAGNOSIS — I10 Essential (primary) hypertension: Secondary | ICD-10-CM

## 2024-08-28 NOTE — Telephone Encounter (Signed)
 Copied from CRM [id number]. Topic: Appointments - Scheduling Inquiry for Clinic >> Aug 28, 2024 12:46 PM Donna BRAVO wrote: Reason for CRM: Patient would like ALL his medication refilled   [facility] [address], [address] [place] [address] Phone: [phone] Fax: [phone] Hours: M-F 7:30a-7:00p  Patient would like a call back regarding scheduling an appt with Michelene Cower PA-C     ----------------------------------------------------------------------- From previous Reason for Contact - Scheduling: Patient/patient representative is calling to schedule an appointment. Refer to attachments for appointment information.  Patient calling to reschedule appt 08/30/24 provider out of office  Patient would like an appt with Leisa Tapia PA-C, patient is scheduled every 3 months.  He is willing to wait until Michelene  comes back, he DID NOT want to schedule with another provider.

## 2024-08-28 NOTE — Telephone Encounter (Signed)
 Called pt back to schedule an appt and had to lvm. Pt will need to be seen by Dr Bernardo or Mliss to get refills. We do not know when Leisa will be back.

## 2024-08-30 ENCOUNTER — Ambulatory Visit: Admitting: Family Medicine

## 2024-08-30 ENCOUNTER — Other Ambulatory Visit: Payer: Self-pay

## 2024-08-30 MED ORDER — LISINOPRIL 40 MG PO TABS
40.0000 mg | ORAL_TABLET | Freq: Every day | ORAL | 0 refills | Status: DC
  Filled 2024-08-30: qty 90, 90d supply, fill #0

## 2024-08-30 NOTE — Telephone Encounter (Signed)
 Requested Prescriptions  Pending Prescriptions Disp Refills   lisinopril  (ZESTRIL ) 40 MG tablet 90 tablet 0    Sig: Take 1 tablet (40 mg total) by mouth daily.     Cardiovascular:  ACE Inhibitors Failed - 08/30/2024 11:44 AM      Failed - Cr in normal range and within 180 days    Creat  Date Value Ref Range Status  02/24/2024 1.15 0.70 - 1.28 mg/dL Final         Failed - K in normal range and within 180 days    Potassium  Date Value Ref Range Status  02/24/2024 4.4 3.5 - 5.3 mmol/L Final         Passed - Patient is not pregnant      Passed - Last BP in normal range    BP Readings from Last 1 Encounters:  08/01/24 123/74         Passed - Valid encounter within last 6 months    Recent Outpatient Visits           3 months ago Tobacco use disorder   [facility] Leavy Mole, PA-C   5 months ago Essential hypertension   [facility] Leavy Mole, PA-C   6 months ago Encounter for medical examination to establish care   [facility] Leavy Mole, PA-C   7 months ago Facial pain   [facility] Leavy Mole, PA-C   7 months ago Facial pain   [facility] Leavy Mole, PA-C

## 2024-09-01 ENCOUNTER — Other Ambulatory Visit: Payer: Self-pay

## 2024-09-08 ENCOUNTER — Ambulatory Visit: Admitting: Nurse Practitioner

## 2024-09-08 NOTE — Progress Notes (Signed)
 Pharmacy Quality Measure Review  This patient is appearing on a report for being at risk of failing the adherence measure for hypertension (ACEi/ARB) medications this calendar year.   Medication: lisinopril  Last fill date: 09/01/24 for 90 day supply  Insurance report was not up to date. No action needed at this time.   Abijah Roussel E. Marsh, PharmD, CPP Clinical Pharmacist [facility] [phone]

## 2024-09-20 ENCOUNTER — Encounter: Payer: Self-pay | Admitting: Family Medicine

## 2024-09-20 ENCOUNTER — Ambulatory Visit: Admitting: Family Medicine

## 2024-09-20 ENCOUNTER — Other Ambulatory Visit: Payer: Self-pay

## 2024-09-20 VITALS — BP 128/84 | HR 65 | Resp 16 | Ht 65.0 in | Wt 156.0 lb

## 2024-09-20 DIAGNOSIS — I129 Hypertensive chronic kidney disease with stage 1 through stage 4 chronic kidney disease, or unspecified chronic kidney disease: Secondary | ICD-10-CM | POA: Diagnosis not present

## 2024-09-20 DIAGNOSIS — Z23 Encounter for immunization: Secondary | ICD-10-CM | POA: Diagnosis not present

## 2024-09-20 DIAGNOSIS — D649 Anemia, unspecified: Secondary | ICD-10-CM | POA: Diagnosis not present

## 2024-09-20 DIAGNOSIS — N183 Chronic kidney disease, stage 3 unspecified: Secondary | ICD-10-CM

## 2024-09-20 DIAGNOSIS — Z8639 Personal history of other endocrine, nutritional and metabolic disease: Secondary | ICD-10-CM | POA: Diagnosis not present

## 2024-09-20 DIAGNOSIS — E785 Hyperlipidemia, unspecified: Secondary | ICD-10-CM

## 2024-09-20 DIAGNOSIS — F325 Major depressive disorder, single episode, in full remission: Secondary | ICD-10-CM

## 2024-09-20 DIAGNOSIS — N1831 Chronic kidney disease, stage 3a: Secondary | ICD-10-CM

## 2024-09-20 DIAGNOSIS — G500 Trigeminal neuralgia: Secondary | ICD-10-CM | POA: Diagnosis not present

## 2024-09-20 MED ORDER — ROSUVASTATIN CALCIUM 10 MG PO TABS
10.0000 mg | ORAL_TABLET | Freq: Every day | ORAL | 0 refills | Status: AC
  Filled 2024-09-20: qty 90, 90d supply, fill #0

## 2024-09-20 MED ORDER — AMLODIPINE BESYLATE 10 MG PO TABS
10.0000 mg | ORAL_TABLET | Freq: Every day | ORAL | 0 refills | Status: AC
  Filled 2024-09-20: qty 90, 90d supply, fill #0

## 2024-09-20 MED ORDER — HYDROCHLOROTHIAZIDE 25 MG PO TABS
25.0000 mg | ORAL_TABLET | Freq: Every day | ORAL | 0 refills | Status: AC
  Filled 2024-09-20: qty 90, 90d supply, fill #0

## 2024-09-20 MED ORDER — LISINOPRIL 40 MG PO TABS
40.0000 mg | ORAL_TABLET | Freq: Every day | ORAL | 0 refills | Status: AC
  Filled 2024-09-20: qty 90, 90d supply, fill #0

## 2024-09-20 MED ORDER — GABAPENTIN 300 MG PO CAPS: 300.0000 mg | ORAL_CAPSULE | Freq: Every day | ORAL | 0 refills | Status: AC

## 2024-09-20 NOTE — Assessment & Plan Note (Addendum)
 Reviewing prior labs ongoing anemia demonstrated though stable.  -Trend CBC  -Add on iron profile to rule out iron deficiency -CKD3a noted.  Orders:   CBC w/Diff/Platelet   Fe+TIBC+Fer

## 2024-09-20 NOTE — Progress Notes (Signed)
 Established Patient Office Visit  Subjective   Patient ID: Daniel Haynes, male    DOB: 08-05-1951  Age: 73 y.o. MRN: [id number]  Chief Complaint  Patient presents with   Medical Management of Chronic Issues    HPI Patient is a pleasant 73 year old male who returns for 4 month follow up and management of chronic conditions. When seen by previous PCP in August there was concerns that he had not been on statin therapy affecting lipid panel results. He reports today he is taking all medications as prescribed stating he went to the pharmacy with his medication list and had his medications filled. Since his last visit here in office he has seen both urology and neurology.   Follows with urology and last visit on 07/14/24. Unable to review OV note. He voices he believes that they were going to do additional testing and see him back in 3 months. I will see if I can get records from urology from his last visit. He was seen by [facility] it appears.  Follows with neurology for management of trigeminal neuralgia. Last neuro visit on 08/01/24. He is now taking Oxcarbazepine  300mg  BID and Gabapentin  was planned to be discontinued but dose has been reduced and he continues 300mg  at bedtime. He states he does not need a refill for Gabapentin . He voices his right facial pain has improved stating 60 to 70% improvement. Advised continued neurology follow up.   He does voice concern of a rash on his right arm. On the right arm there is dry, flaking skin most consistent with dermatitis suspected eczema based off appearance. He does report associated pruritus. Recommended use of OTC hydrocortisone BID as needed and follow with fragrance free moisturizer. He is receptive.   Will note last depression screening positive for moderate depression. F/u on mood today and reports no feelings of being down or depressed, denies sadness, and reports appropriate energy levels. No ongoing concern regarding mood at  this time. He voices he feels that positive depression screening was impacted by pain he had been experiencing for the last one year.          02/24/2024   10:15 AM 10/26/2023    3:31 PM 10/27/2021    8:37 AM  Depression screen PHQ 2/9  Decreased Interest 0 1 0  Down, Depressed, Hopeless 3 0 0  PHQ - 2 Score 3 1 0  Altered sleeping 3    Tired, decreased energy 3    Change in appetite 0    Feeling bad or failure about yourself  1    Trouble concentrating 2    Moving slowly or fidgety/restless 0    Suicidal thoughts 0    PHQ-9 Score 12        Data saved with a previous flowsheet row definition     Review of Systems  Constitutional:  Negative for malaise/fatigue.  Respiratory:  Negative for shortness of breath.   Cardiovascular:  Negative for chest pain and palpitations.  Skin:  Positive for rash.  Neurological:  Positive for dizziness. Negative for loss of consciousness and headaches.  Psychiatric/Behavioral:  Negative for depression. The patient is not nervous/anxious.       Objective:     BP 128/84   Pulse 65   Resp 16   Ht 5' 5 (1.651 m)   Wt 156 lb (70.8 kg)   SpO2 100%   BMI 25.96 kg/m  BP Readings from Last 3 Encounters:  09/20/24 128/84  08/01/24 123/74  05/26/24 136/70   Wt Readings from Last 3 Encounters:  09/20/24 156 lb (70.8 kg)  08/01/24 157 lb (71.2 kg)  05/26/24 157 lb (71.2 kg)      Physical Exam Constitutional:      Appearance: Normal appearance. He is normal weight.  Cardiovascular:     Rate and Rhythm: Normal rate and regular rhythm.     Heart sounds: Normal heart sounds.  Pulmonary:     Effort: Pulmonary effort is normal. No respiratory distress.     Breath sounds: Normal breath sounds. No wheezing, rhonchi or rales.  Skin:    General: Skin is warm and dry.     Findings: Rash present.  Neurological:     Mental Status: He is alert.  Psychiatric:        Mood and Affect: Mood normal.        Behavior: Behavior normal.     Last  CBC Lab Results  Component Value Date   WBC 5.8 02/24/2024   HGB 12.6 (L) 02/24/2024   HCT 37.2 (L) 02/24/2024   MCV 91.4 02/24/2024   MCH 31.0 02/24/2024   RDW 13.2 02/24/2024   PLT 296 02/24/2024   Last metabolic panel Lab Results  Component Value Date   GLUCOSE 85 02/24/2024   NA 143 02/24/2024   K 4.4 02/24/2024   CL 105 02/24/2024   CO2 30 02/24/2024   BUN 20 02/24/2024   CREATININE 1.15 02/24/2024   EGFR 68 02/24/2024   CALCIUM  9.7 02/24/2024   PROT 7.3 02/24/2024   ALBUMIN 4.4 10/26/2023   LABGLOB 2.6 10/26/2023   AGRATIO 1.4 02/03/2021   BILITOT 0.7 02/24/2024   ALKPHOS 111 10/26/2023   AST 14 02/24/2024   ALT 10 02/24/2024   ANIONGAP 13 12/30/2015   Last lipids Lab Results  Component Value Date   CHOL 201 (H) 02/24/2024   HDL 59 02/24/2024   LDLCALC 120 (H) 02/24/2024   TRIG 113 02/24/2024   CHOLHDL 3.4 02/24/2024   Last hemoglobin A1c Lab Results  Component Value Date   HGBA1C 5.1 08/25/2019   Last vitamin D  Lab Results  Component Value Date   VD25OH 44.7 12/23/2020          Assessment & Plan:   Assessment & Plan Benign hypertension with CKD (chronic kidney disease) stage III (HCC) No changes to antihypertensive medication regimen. Continue hydrochlorothiazide  25mg  daily, Lisinopril  40mg  daily, and Amlodipine  10mg  daily as prescribed. Refills provided.  Orders:   Comprehensive Metabolic Panel (CMET)   Lipid Profile   hydrochlorothiazide  (HYDRODIURIL ) 25 MG tablet; Take 1 tablet (25 mg total) by mouth daily.   lisinopril  (ZESTRIL ) 40 MG tablet; Take 1 tablet (40 mg total) by mouth daily.   amLODipine  (NORVASC ) 10 MG tablet; Take 1 tablet (10 mg total) by mouth daily.  Hyperlipidemia, unspecified hyperlipidemia type Reviewed prior lipid panel. Advised continue Rosuvastatin  10mg  daily as prescribed. Lipid panel obtained today. Orders:   Lipid Profile   rosuvastatin  (CRESTOR ) 10 MG tablet; Take 1 tablet (10 mg total) by mouth  daily.  Anemia, unspecified type Reviewing prior labs ongoing anemia demonstrated though stable.  -Trend CBC  -Add on iron profile to rule out iron deficiency -CKD3a noted.  Orders:   CBC w/Diff/Platelet   Fe+TIBC+Fer  Chronic kidney disease (CKD) stage G3a/A1, moderately decreased glomerular filtration rate (GFR) between 45-59 mL/min/1.73 square meter and albuminuria creatinine ratio less than 30 mg/g (HCC) -Reinforced importance of adequate BP control.  -Trend CMP. Orders:  Comprehensive Metabolic Panel (CMET)   Vitamin D  (25 hydroxy)  Trigeminal neuralgia of right side of face Continue to follow with neurology for management of trigeminal neuralgia.  -Neuro note reviews that there was discussion to discontinue Gabapentin  however dose was reduced to 300mg  (one capsule) at bedtime.  -Pt denies refills for Gabapentin . Gabapentin  not refilled, however instructions updated.  Orders:   gabapentin  (NEURONTIN ) 300 MG capsule; Take 1 capsule (300 mg total) by mouth at bedtime.  Major depressive disorder with single episode, in full remission -Mood appropriate. Pt denies depression symptoms -Does not feel that medication management is needed. -He feels that his pain he was in impacted mood at time of screening.     History of hyperglycemia -Prior hyperglycemia seen on chem panels, will screen for diabetes with other chronic conditions.  Orders:   HgB A1c  Immunization due Flu vaccine due. Flu vaccine given.  Orders:   Flu vaccine HIGH DOSE PF(Fluzone Trivalent)      Return in about 3 months (around 12/19/2024).    LAYMON LOISE CORE, FNP

## 2024-09-20 NOTE — Assessment & Plan Note (Addendum)
-  Reinforced importance of adequate BP control.  -Trend CMP. Orders:   Comprehensive Metabolic Panel (CMET)   Vitamin D  (25 hydroxy)

## 2024-09-20 NOTE — Patient Instructions (Signed)
 Use over the counter Hydrocortisone cream twice a day as needed for the rash on your arm. I would recommend applying a moisturizer without fragrance such as Eucerin cream or Ceravae lotion after you have applied the Hydrocortisone.  For the dry, flaking skin on the ear I would recommend using the moisturizer without fragrance twice daily as needed.   If your rash seems to get worse, changes or new symptoms arise please come back to see me and we will re-evaluate condition.

## 2024-09-20 NOTE — Assessment & Plan Note (Addendum)
 Reviewed prior lipid panel. Advised continue Rosuvastatin  10mg  daily as prescribed. Lipid panel obtained today. Orders:   Lipid Profile   rosuvastatin  (CRESTOR ) 10 MG tablet; Take 1 tablet (10 mg total) by mouth daily.

## 2024-09-21 ENCOUNTER — Ambulatory Visit: Payer: Self-pay | Admitting: Family Medicine

## 2024-09-21 LAB — LIPID PANEL
Cholesterol: 200 mg/dL — ABNORMAL HIGH (ref <200)
HDL: 48 mg/dL (ref >=40)
LDL Cholesterol (Calc): 124 mg/dL — ABNORMAL HIGH
Non-HDL Cholesterol (Calc): 152 mg/dL — ABNORMAL HIGH (ref <130)
Total CHOL/HDL Ratio: 4.2 (calc) (ref <5.0)
Triglycerides: 162 mg/dL — ABNORMAL HIGH (ref <150)

## 2024-09-21 LAB — CBC WITH DIFFERENTIAL/PLATELET
Absolute Lymphocytes: 1690 {cells}/uL (ref 850–3900)
Absolute Monocytes: 514 {cells}/uL (ref 200–950)
Basophils Absolute: 19 {cells}/uL (ref 0–200)
Basophils Relative: 0.4 %
Eosinophils Absolute: 221 {cells}/uL (ref 15–500)
Eosinophils Relative: 4.6 %
HCT: 34.3 % — ABNORMAL LOW (ref 39.4–51.1)
Hemoglobin: 11.3 g/dL — ABNORMAL LOW (ref 13.2–17.1)
MCH: 29.7 pg (ref 27.0–33.0)
MCHC: 32.9 g/dL (ref 31.6–35.4)
MCV: 90.3 fL (ref 81.4–101.7)
MPV: 9.3 fL (ref 7.5–12.5)
Monocytes Relative: 10.7 %
Neutro Abs: 2357 {cells}/uL (ref 1500–7800)
Neutrophils Relative %: 49.1 %
Platelets: 322 Thousand/uL (ref 140–400)
RBC: 3.8 Million/uL — ABNORMAL LOW (ref 4.20–5.80)
RDW: 12.8 % (ref 11.0–15.0)
Total Lymphocyte: 35.2 %
WBC: 4.8 Thousand/uL (ref 3.8–10.8)

## 2024-09-21 LAB — COMPREHENSIVE METABOLIC PANEL WITH GFR
AG Ratio: 1.6 (calc) (ref 1.0–2.5)
ALT: 30 U/L (ref 9–46)
AST: 26 U/L (ref 10–35)
Albumin: 4.4 g/dL (ref 3.6–5.1)
Alkaline phosphatase (APISO): 88 U/L (ref 35–144)
BUN: 25 mg/dL (ref 7–25)
CO2: 29 mmol/L (ref 20–32)
Calcium: 9.1 mg/dL (ref 8.6–10.3)
Chloride: 104 mmol/L (ref 98–110)
Creat: 1.24 mg/dL (ref 0.70–1.28)
Globulin: 2.8 g/dL (ref 1.9–3.7)
Glucose, Bld: 90 mg/dL (ref 65–99)
Potassium: 4.6 mmol/L (ref 3.5–5.3)
Sodium: 139 mmol/L (ref 135–146)
Total Bilirubin: 0.3 mg/dL (ref 0.2–1.2)
Total Protein: 7.2 g/dL (ref 6.1–8.1)
eGFR: 61 mL/min/1.73m2 (ref >=60)

## 2024-09-21 LAB — HEMOGLOBIN A1C
Hgb A1c MFr Bld: 5.4 % (ref <5.7)
Mean Plasma Glucose: 108 mg/dL
eAG (mmol/L): 6 mmol/L

## 2024-09-21 LAB — VITAMIN D 25 HYDROXY (VIT D DEFICIENCY, FRACTURES): Vit D, 25-Hydroxy: 34 ng/mL (ref 30–100)

## 2024-09-21 LAB — IRON,TIBC AND FERRITIN PANEL
%SAT: 38 % (ref 20–48)
Ferritin: 393 ng/mL — ABNORMAL HIGH (ref 24–380)
Iron: 393 ug/dL — AB (ref 50–380)
TIBC: 282 ug/dL (ref 250–425)

## 2024-10-06 ENCOUNTER — Other Ambulatory Visit: Payer: Self-pay

## 2024-10-06 ENCOUNTER — Ambulatory Visit: Admitting: Family Medicine

## 2024-10-06 ENCOUNTER — Encounter: Payer: Self-pay | Admitting: Family Medicine

## 2024-10-06 VITALS — BP 118/70 | HR 64 | Resp 16 | Ht 65.0 in | Wt 156.0 lb

## 2024-10-06 DIAGNOSIS — R350 Frequency of micturition: Secondary | ICD-10-CM | POA: Diagnosis not present

## 2024-10-06 DIAGNOSIS — E785 Hyperlipidemia, unspecified: Secondary | ICD-10-CM | POA: Diagnosis not present

## 2024-10-06 DIAGNOSIS — N183 Chronic kidney disease, stage 3 unspecified: Secondary | ICD-10-CM

## 2024-10-06 DIAGNOSIS — I129 Hypertensive chronic kidney disease with stage 1 through stage 4 chronic kidney disease, or unspecified chronic kidney disease: Secondary | ICD-10-CM | POA: Diagnosis not present

## 2024-10-06 DIAGNOSIS — K635 Polyp of colon: Secondary | ICD-10-CM

## 2024-10-06 DIAGNOSIS — Z1211 Encounter for screening for malignant neoplasm of colon: Secondary | ICD-10-CM

## 2024-10-06 DIAGNOSIS — N401 Enlarged prostate with lower urinary tract symptoms: Secondary | ICD-10-CM | POA: Diagnosis not present

## 2024-10-06 MED ORDER — HYDROCHLOROTHIAZIDE 25 MG PO TABS
25.0000 mg | ORAL_TABLET | Freq: Every day | ORAL | 1 refills | Status: AC
  Filled 2024-10-06: qty 90, 90d supply, fill #0

## 2024-10-06 MED ORDER — LISINOPRIL 40 MG PO TABS
40.0000 mg | ORAL_TABLET | Freq: Every day | ORAL | 1 refills | Status: AC
  Filled 2024-10-06: qty 90, 90d supply, fill #0

## 2024-10-06 MED ORDER — AMLODIPINE BESYLATE 10 MG PO TABS
10.0000 mg | ORAL_TABLET | Freq: Every day | ORAL | 1 refills | Status: AC
  Filled 2024-10-06: qty 90, 90d supply, fill #0

## 2024-10-06 MED ORDER — ROSUVASTATIN CALCIUM 20 MG PO TABS
20.0000 mg | ORAL_TABLET | Freq: Every day | ORAL | 1 refills | Status: AC
  Filled 2024-10-06: qty 90, 90d supply, fill #0

## 2024-10-06 MED ORDER — FINASTERIDE 5 MG PO TABS
5.0000 mg | ORAL_TABLET | Freq: Every day | ORAL | 1 refills | Status: AC
  Filled 2024-10-06: qty 90, 90d supply, fill #0

## 2024-10-06 NOTE — Assessment & Plan Note (Signed)
 Reviewed urology office visit note that was requested at last visit. New medication added on for BPH management includes Gemtesa however patient does not have this medication with him.  Urology plan indicated to continue Finasteride  and Tamsulosin  as prescribed. Orders:   finasteride  (PROSCAR ) 5 MG tablet; Take 1 tablet (5 mg total) by mouth daily.

## 2024-10-06 NOTE — Progress Notes (Signed)
 "  Established Patient Office Visit  Subjective   Patient ID: Daniel Haynes, male    DOB: 11-Jun-1951  Age: 73 y.o. MRN: [id number]  Chief Complaint  Patient presents with   Medication Management    HPI Patient is a pleasant 73 year old male who returns for lab follow up and medication management. He does bring all of his medications with him as requested. Brief follow up on rash and he voices this seems to be doing better with the A&D ointment that he got from his sister. Offered dermatology referral, however not desired at this time.     Review of Systems  Skin:  Positive for rash.      Objective:     BP 118/70   Pulse 64   Resp 16   Ht 5' 5 (1.651 m)   Wt 156 lb (70.8 kg)   SpO2 99%   BMI 25.96 kg/m  BP Readings from Last 3 Encounters:  10/06/24 118/70  09/20/24 128/84  08/01/24 123/74      Physical Exam Constitutional:      General: He is not in acute distress.    Appearance: Normal appearance. He is normal weight.  Neck:     Vascular: No carotid bruit.  Cardiovascular:     Rate and Rhythm: Normal rate and regular rhythm.     Heart sounds: Normal heart sounds.  Pulmonary:     Effort: Pulmonary effort is normal.     Breath sounds: Normal breath sounds.  Skin:    General: Skin is warm and dry.     Findings: Rash present.  Neurological:     General: No focal deficit present.     Mental Status: He is alert. Mental status is at baseline.  Psychiatric:        Mood and Affect: Mood normal.        Behavior: Behavior normal.     Last lipids Lab Results  Component Value Date   CHOL 200 (H) 09/20/2024   HDL 48 09/20/2024   LDLCALC 124 (H) 09/20/2024   TRIG 162 (H) 09/20/2024   CHOLHDL 4.2 09/20/2024        Latest Ref Rng & Units 09/20/2024    9:59 AM 02/24/2024   11:36 AM 12/10/2023    2:48 PM  CMP  Glucose 65 - 99 mg/dL 90  85  896   BUN 7 - 25 mg/dL 25  20  24    Creatinine 0.70 - 1.28 mg/dL 8.75  8.84  8.67   Sodium 135 - 146 mmol/L 139  143  145    Potassium 3.5 - 5.3 mmol/L 4.6  4.4  4.1   Chloride 98 - 110 mmol/L 104  105  104   CO2 20 - 32 mmol/L 29  30  24    Calcium  8.6 - 10.3 mg/dL 9.1  9.7  9.3   Total Protein 6.1 - 8.1 g/dL 7.2  7.3    Total Bilirubin 0.2 - 1.2 mg/dL 0.3  0.7    AST 10 - 35 U/L 26  14    ALT 9 - 46 U/L 30  10           Assessment & Plan:   Assessment & Plan Hyperlipidemia, unspecified hyperlipidemia type Today's visit for lab follow up and medication management. Specifically addressing abnormal lipid panel. LDL remains above goal and triglycerides elevated. Last LDL of 124 and triglycerides at 162. Previous concern that he was not on most recent prescribed statin, Rosuvastatin , however  he brings medications with him today and this is his medication bag.   -Increase Rosuvastatin  from 10 to 20mg  once daily. New prescription sent with instructions to take Rosuvastatin  20mg  one tablet PO once daily. He is receptive -Advised patient since he has remaining 10mg  Rosuvastatin  tablets that he can take two tablets to make a total dose of 20mg  until he is out of 10mg  tablets, and then he should pick up new prescription for Rosuvastatin  20mg  and start taking one tablet daily for total dose of 20mg  daily. He voices understanding.  -Patient advised to maintain scheduled 12/2024 follow up. He is agreeable.   Orders:   rosuvastatin  (CRESTOR ) 20 MG tablet; Take 1 tablet (20 mg total) by mouth daily.  Polyp of colon, unspecified part of colon, unspecified type Colonoscopy from 2017 and pathology reviewed after completion of last visit. Several polyps were removed and found to be benign, but precancerous. GI had requested 3 year follow up, however this did not happen as desired. He voices he was unaware that colonoscopy was overdue and cannot recall if he had been contacted or not. Advised referral to GI for follow up colonoscopy. He is agreeable.  -GI referral placed for colonoscopy due to prior findings in 2017 of several  precancerous polyps and recommended 3 year follow up  Orders:   Ambulatory referral to Gastroenterology  Colon cancer screening CRC screening is not up to date. Last colonoscopy in 2017 and recommended 3 year follow up.   -GI referral placed for CRC screening  Orders:   Ambulatory referral to Gastroenterology  Benign hypertension with CKD (chronic kidney disease) stage III (HCC) HTN controlled and CKD stable. No changes to antihypertensive regimen. Refills provided.  Orders:   amLODipine  (NORVASC ) 10 MG tablet; Take 1 tablet (10 mg total) by mouth daily.   lisinopril  (ZESTRIL ) 40 MG tablet; Take 1 tablet (40 mg total) by mouth daily.   hydrochlorothiazide  (HYDRODIURIL ) 25 MG tablet; Take 1 tablet (25 mg total) by mouth daily.  Benign prostatic hyperplasia with urinary frequency Reviewed urology office visit note that was requested at last visit. New medication added on for BPH management includes Gemtesa however patient does not have this medication with him.  Urology plan indicated to continue Finasteride  and Tamsulosin  as prescribed. Orders:   finasteride  (PROSCAR ) 5 MG tablet; Take 1 tablet (5 mg total) by mouth daily.     LAYMON LOISE CORE, FNP "

## 2024-10-06 NOTE — Assessment & Plan Note (Addendum)
 Today's visit for lab follow up and medication management. Specifically addressing abnormal lipid panel. LDL remains above goal and triglycerides elevated. Last LDL of 124 and triglycerides at 162. Previous concern that he was not on most recent prescribed statin, Rosuvastatin , however he brings medications with him today and this is his medication bag.   -Increase Rosuvastatin  from 10 to 20mg  once daily. New prescription sent with instructions to take Rosuvastatin  20mg  one tablet PO once daily. He is receptive -Advised patient since he has remaining 10mg  Rosuvastatin  tablets that he can take two tablets to make a total dose of 20mg  until he is out of 10mg  tablets, and then he should pick up new prescription for Rosuvastatin  20mg  and start taking one tablet daily for total dose of 20mg  daily. He voices understanding.  -Patient advised to maintain scheduled 12/2024 follow up. He is agreeable.   Orders:   rosuvastatin  (CRESTOR ) 20 MG tablet; Take 1 tablet (20 mg total) by mouth daily.

## 2024-10-18 ENCOUNTER — Other Ambulatory Visit: Payer: Self-pay

## 2024-11-22 ENCOUNTER — Other Ambulatory Visit: Payer: Self-pay

## 2024-12-19 ENCOUNTER — Ambulatory Visit: Admitting: Family Medicine
